# Patient Record
Sex: Female | Born: 1970 | Race: White | Hispanic: No | Marital: Married | Smoking: Never smoker
Health system: Southern US, Community
[De-identification: ages and names within clinical notes are randomized; demographics above are authoritative.]

## PROBLEM LIST (undated history)

## (undated) DIAGNOSIS — Z87442 Personal history of urinary calculi: Secondary | ICD-10-CM

## (undated) DIAGNOSIS — I10 Essential (primary) hypertension: Secondary | ICD-10-CM

## (undated) DIAGNOSIS — K219 Gastro-esophageal reflux disease without esophagitis: Secondary | ICD-10-CM

## (undated) DIAGNOSIS — T8859XA Other complications of anesthesia, initial encounter: Secondary | ICD-10-CM

## (undated) DIAGNOSIS — E785 Hyperlipidemia, unspecified: Secondary | ICD-10-CM

## (undated) DIAGNOSIS — N281 Cyst of kidney, acquired: Secondary | ICD-10-CM

## (undated) DIAGNOSIS — T7840XA Allergy, unspecified, initial encounter: Secondary | ICD-10-CM

## (undated) DIAGNOSIS — R112 Nausea with vomiting, unspecified: Secondary | ICD-10-CM

## (undated) DIAGNOSIS — F32A Depression, unspecified: Secondary | ICD-10-CM

## (undated) DIAGNOSIS — E119 Type 2 diabetes mellitus without complications: Secondary | ICD-10-CM

## (undated) DIAGNOSIS — N200 Calculus of kidney: Secondary | ICD-10-CM

## (undated) DIAGNOSIS — I499 Cardiac arrhythmia, unspecified: Secondary | ICD-10-CM

## (undated) DIAGNOSIS — G43909 Migraine, unspecified, not intractable, without status migrainosus: Secondary | ICD-10-CM

## (undated) DIAGNOSIS — Z9889 Other specified postprocedural states: Secondary | ICD-10-CM

## (undated) DIAGNOSIS — F329 Major depressive disorder, single episode, unspecified: Secondary | ICD-10-CM

## (undated) DIAGNOSIS — J45909 Unspecified asthma, uncomplicated: Secondary | ICD-10-CM

## (undated) HISTORY — DX: Hyperlipidemia, unspecified: E78.5

## (undated) HISTORY — PX: CHOLECYSTECTOMY: SHX55

## (undated) HISTORY — DX: Essential (primary) hypertension: I10

## (undated) HISTORY — DX: Major depressive disorder, single episode, unspecified: F32.9

## (undated) HISTORY — DX: Depression, unspecified: F32.A

## (undated) HISTORY — PX: TONSILLECTOMY: SUR1361

## (undated) HISTORY — DX: Gastro-esophageal reflux disease without esophagitis: K21.9

## (undated) HISTORY — DX: Migraine, unspecified, not intractable, without status migrainosus: G43.909

## (undated) HISTORY — DX: Allergy, unspecified, initial encounter: T78.40XA

## (undated) HISTORY — DX: Cyst of kidney, acquired: N28.1

## (undated) HISTORY — DX: Calculus of kidney: N20.0

## (undated) HISTORY — PX: OTHER SURGICAL HISTORY: SHX169

## (undated) HISTORY — PX: CARDIAC CATHETERIZATION: SHX172

## (undated) HISTORY — PX: ABDOMINAL HYSTERECTOMY: SHX81

## (undated) HISTORY — PX: LITHOTRIPSY: SUR834

## (undated) HISTORY — DX: Unspecified asthma, uncomplicated: J45.909

## (undated) HISTORY — DX: Type 2 diabetes mellitus without complications: E11.9

---

## 1997-07-28 ENCOUNTER — Other Ambulatory Visit: Admission: RE | Admit: 1997-07-28 | Discharge: 1997-07-28 | Payer: Self-pay | Admitting: *Deleted

## 1997-12-13 ENCOUNTER — Ambulatory Visit (HOSPITAL_COMMUNITY): Admission: RE | Admit: 1997-12-13 | Discharge: 1997-12-13 | Payer: Self-pay | Admitting: Urology

## 1997-12-13 ENCOUNTER — Encounter: Payer: Self-pay | Admitting: Urology

## 1997-12-28 ENCOUNTER — Observation Stay (HOSPITAL_COMMUNITY): Admission: RE | Admit: 1997-12-28 | Discharge: 1997-12-29 | Payer: Self-pay | Admitting: Surgery

## 1997-12-28 ENCOUNTER — Encounter: Payer: Self-pay | Admitting: Surgery

## 1999-04-10 ENCOUNTER — Encounter: Admission: RE | Admit: 1999-04-10 | Discharge: 1999-04-10 | Payer: Self-pay | Admitting: Family Medicine

## 1999-04-10 ENCOUNTER — Encounter: Payer: Self-pay | Admitting: Family Medicine

## 1999-11-23 ENCOUNTER — Encounter: Admission: RE | Admit: 1999-11-23 | Discharge: 1999-11-23 | Payer: Self-pay | Admitting: Urology

## 1999-11-23 ENCOUNTER — Encounter: Payer: Self-pay | Admitting: Urology

## 1999-12-27 ENCOUNTER — Encounter: Payer: Self-pay | Admitting: Urology

## 1999-12-28 ENCOUNTER — Encounter: Payer: Self-pay | Admitting: Urology

## 2000-01-12 ENCOUNTER — Ambulatory Visit (HOSPITAL_COMMUNITY): Admission: RE | Admit: 2000-01-12 | Discharge: 2000-01-12 | Payer: Self-pay | Admitting: Urology

## 2000-01-18 ENCOUNTER — Encounter: Payer: Self-pay | Admitting: Urology

## 2000-01-18 ENCOUNTER — Encounter: Admission: RE | Admit: 2000-01-18 | Discharge: 2000-01-18 | Payer: Self-pay | Admitting: Urology

## 2000-01-29 ENCOUNTER — Encounter: Admission: RE | Admit: 2000-01-29 | Discharge: 2000-01-29 | Payer: Self-pay | Admitting: Urology

## 2000-01-29 ENCOUNTER — Encounter: Payer: Self-pay | Admitting: Urology

## 2000-08-12 ENCOUNTER — Other Ambulatory Visit: Admission: RE | Admit: 2000-08-12 | Discharge: 2000-08-12 | Payer: Self-pay | Admitting: *Deleted

## 2000-10-23 ENCOUNTER — Inpatient Hospital Stay (HOSPITAL_COMMUNITY): Admission: AD | Admit: 2000-10-23 | Discharge: 2000-10-23 | Payer: Self-pay | Admitting: Obstetrics and Gynecology

## 2000-10-29 ENCOUNTER — Encounter: Admission: RE | Admit: 2000-10-29 | Discharge: 2001-01-27 | Payer: Self-pay | Admitting: *Deleted

## 2001-02-03 ENCOUNTER — Encounter (INDEPENDENT_AMBULATORY_CARE_PROVIDER_SITE_OTHER): Payer: Self-pay | Admitting: Specialist

## 2001-02-03 ENCOUNTER — Inpatient Hospital Stay (HOSPITAL_COMMUNITY): Admission: AD | Admit: 2001-02-03 | Discharge: 2001-02-06 | Payer: Self-pay | Admitting: Obstetrics & Gynecology

## 2001-02-07 ENCOUNTER — Encounter: Admission: RE | Admit: 2001-02-07 | Discharge: 2001-03-09 | Payer: Self-pay | Admitting: Obstetrics & Gynecology

## 2001-03-20 ENCOUNTER — Other Ambulatory Visit: Admission: RE | Admit: 2001-03-20 | Discharge: 2001-03-20 | Payer: Self-pay | Admitting: *Deleted

## 2002-09-25 ENCOUNTER — Other Ambulatory Visit: Admission: RE | Admit: 2002-09-25 | Discharge: 2002-09-25 | Payer: Self-pay | Admitting: *Deleted

## 2003-04-05 ENCOUNTER — Encounter: Admission: RE | Admit: 2003-04-05 | Discharge: 2003-07-04 | Payer: Self-pay | Admitting: Family Medicine

## 2003-08-12 ENCOUNTER — Encounter: Admission: RE | Admit: 2003-08-12 | Discharge: 2003-11-10 | Payer: Self-pay | Admitting: Family Medicine

## 2004-04-07 ENCOUNTER — Encounter (INDEPENDENT_AMBULATORY_CARE_PROVIDER_SITE_OTHER): Payer: Self-pay | Admitting: Specialist

## 2004-04-07 ENCOUNTER — Ambulatory Visit (HOSPITAL_COMMUNITY): Admission: RE | Admit: 2004-04-07 | Discharge: 2004-04-07 | Payer: Self-pay | Admitting: Urology

## 2004-12-13 ENCOUNTER — Other Ambulatory Visit: Admission: RE | Admit: 2004-12-13 | Discharge: 2004-12-13 | Payer: Self-pay | Admitting: Obstetrics & Gynecology

## 2005-08-29 ENCOUNTER — Ambulatory Visit (HOSPITAL_COMMUNITY): Admission: RE | Admit: 2005-08-29 | Discharge: 2005-08-29 | Payer: Self-pay | Admitting: Urology

## 2006-02-20 ENCOUNTER — Emergency Department (HOSPITAL_COMMUNITY): Admission: EM | Admit: 2006-02-20 | Discharge: 2006-02-21 | Payer: Self-pay | Admitting: Emergency Medicine

## 2006-09-03 ENCOUNTER — Encounter: Admission: RE | Admit: 2006-09-03 | Discharge: 2006-09-03 | Payer: Self-pay | Admitting: Family Medicine

## 2008-08-01 ENCOUNTER — Observation Stay (HOSPITAL_COMMUNITY): Admission: EM | Admit: 2008-08-01 | Discharge: 2008-08-01 | Payer: Self-pay | Admitting: Emergency Medicine

## 2008-09-01 ENCOUNTER — Encounter: Admission: RE | Admit: 2008-09-01 | Discharge: 2008-09-01 | Payer: Self-pay | Admitting: Family Medicine

## 2009-01-29 DIAGNOSIS — D649 Anemia, unspecified: Secondary | ICD-10-CM

## 2009-01-29 HISTORY — DX: Anemia, unspecified: D64.9

## 2009-07-19 ENCOUNTER — Emergency Department (HOSPITAL_COMMUNITY): Admission: EM | Admit: 2009-07-19 | Discharge: 2009-07-19 | Payer: Self-pay | Admitting: Emergency Medicine

## 2009-08-03 DIAGNOSIS — F329 Major depressive disorder, single episode, unspecified: Secondary | ICD-10-CM | POA: Insufficient documentation

## 2009-08-03 DIAGNOSIS — E7800 Pure hypercholesterolemia, unspecified: Secondary | ICD-10-CM | POA: Insufficient documentation

## 2009-08-03 DIAGNOSIS — I10 Essential (primary) hypertension: Secondary | ICD-10-CM | POA: Insufficient documentation

## 2009-08-03 DIAGNOSIS — K219 Gastro-esophageal reflux disease without esophagitis: Secondary | ICD-10-CM | POA: Insufficient documentation

## 2009-08-03 DIAGNOSIS — J45909 Unspecified asthma, uncomplicated: Secondary | ICD-10-CM | POA: Insufficient documentation

## 2009-08-03 DIAGNOSIS — F3289 Other specified depressive episodes: Secondary | ICD-10-CM | POA: Insufficient documentation

## 2009-08-03 DIAGNOSIS — J301 Allergic rhinitis due to pollen: Secondary | ICD-10-CM | POA: Insufficient documentation

## 2009-08-03 DIAGNOSIS — E669 Obesity, unspecified: Secondary | ICD-10-CM | POA: Insufficient documentation

## 2009-08-03 DIAGNOSIS — E119 Type 2 diabetes mellitus without complications: Secondary | ICD-10-CM | POA: Insufficient documentation

## 2009-08-04 ENCOUNTER — Ambulatory Visit: Payer: Self-pay | Admitting: Internal Medicine

## 2009-08-04 DIAGNOSIS — R05 Cough: Secondary | ICD-10-CM | POA: Insufficient documentation

## 2009-08-04 DIAGNOSIS — R059 Cough, unspecified: Secondary | ICD-10-CM | POA: Insufficient documentation

## 2009-08-19 ENCOUNTER — Ambulatory Visit: Payer: Self-pay | Admitting: Internal Medicine

## 2010-02-28 NOTE — Letter (Signed)
Summary: Out of Work  [employer]  [address]   [place], [place] [address]   Phone: [phone]  Fax: [phone]    August 19, 2009   Employee:  JAKAI ONOFRE    To Whom It May Concern:   For Medical reasons, please excuse the above named employee from work for the following dates:  Start:   08/19/09  End:   08/19/09  If you need additional information, please feel free to contact our office.         Sincerely,    Sandrea Hughs, MD

## 2010-02-28 NOTE — Letter (Signed)
Summary: Out of Work  [employer]  [address]   [place], [place] [address]   Phone: [phone]  Fax: [phone]    August 04, 2009   Employee:  JORDI KAMM    To Whom It May Concern:   For Medical reasons, please excuse the above named employee from work for the following dates:  Start:   08/04/09  End:   08/04/09  If you need additional information, please feel free to contact our office.         Sincerely,    Sandrea Hughs, MD

## 2010-02-28 NOTE — Assessment & Plan Note (Signed)
Summary: Pulmonary/ new pt eval for cough   Visit Type:  Initial Consult Copy to:  Dr. Lupita Raider Primary Provider/Referring Provider:  Dr. Lupita Raider  CC:  Dyspnea and Cough.  History of Present Illness: 40 yowf never smoker dx asthma age 40 last under good control in 2010  = using symbicort 2 puffs in am with occ use of saba mostly in spring and fall or maybe with chest tight wheezy and sob  August 04, 2009 cc new cough since February 2010 while on ace d/c'd maybe two months but no better at all after stopped cough is day > night, minimally productive.  seen by allergy Willa Rough no allergy tests but didn't feel it was asthma, no better on symbicort or saba.  cough so hard has lost urine and breath. Pt denies any significant sore throat, dysphagia, itching, sneezing,  nasal congestion or excess secretions,  fever, chills, sweats, unintended wt loss, pleuritic or exertional cp, hempoptysis, change in activity tolerance  orthopnea pnd or leg swelling Pt also denies any obvious fluctuation in symptoms with weather or environmental change or other alleviating or aggravating factors x worse laughing and with use of voice.   Current Medications (verified): 1)  Patanase 0.6 % Soln (Olopatadine Hcl) .Marland Kitchen.. 1 Spray Each Nostril At Bedtime 2)  Fluticasone Propionate 50 Mcg/act Susp (Fluticasone Propionate) .Marland Kitchen.. 1 Spray Each Nostril Every Am 3)  Symbicort (? Strength) .... 2 Puffs Two Times A Day 4)  Omeprazole 20 Mg Cpdr (Omeprazole) .Marland Kitchen.. 1 Once Daily 5)  Simvastatin 80 Mg Tabs (Simvastatin) .Marland Kitchen.. 1 Once Daily 6)  Diovan 80 Mg Tabs (Valsartan) .Marland Kitchen.. 1 Once Daily 7)  Metoprolol Succinate 25 Mg Xr24h-Tab (Metoprolol Succinate) .Marland Kitchen.. 1 Once Daily  Allergies (verified): 1)  ! Sulfa  Past History:  Past Medical History:  GERD (ICD-530.81) DEPRESSION (ICD-311) OBESITY, UNSPECIFIED (ICD-278.00) ASTHMA (ICD-493.90) ALLERGIC RHINITIS DUE TO POLLEN (ICD-477.0) DIABETES MELLITUS  (ICD-250.00) HYPERTENSION, BENIGN ESSENTIAL (ICD-401.1) PURE HYPERCHOLESTEROLEMIA (ICD-272.0) Chronic cough.....................................................Marland KitchenWert     - onset 03/2009     - off ACE around 4/-05/2009  Past Surgical History: Cholecystectomy 1998 Tubal ligation 2003  Family History: Asthma- Brother Heart dz- Father and both Grandmothers  Social History: Never smoker 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  The patient denies coughing up blood, irregular heartbeats, acid heartburn, indigestion, loss of appetite, weight change, abdominal pain, difficulty swallowing, sore throat, tooth/dental problems, nasal congestion/difficulty breathing through nose, sneezing, itching, ear ache, anxiety, depression, hand/feet swelling, joint stiffness or pain, rash, change in color of mucus, and fever.    Vital Signs:  Patient profile:   40 year old female Height:      59 inches Weight:      157 pounds BMI:     31.82 O2 Sat:      99 % on Room air Temp:     98.3 degrees F oral Pulse rate:   88 / minute BP sitting:   116 / 84  (left arm)  Vitals Entered By: Vernie Murders (August 04, 2009 10:30 AM)  O2 Flow:  Room air  Physical Exam  Additional Exam:  wt 157 August 04, 2009 amb wf mild voice fatigue HEENT: nl dentition, turbinates, and orophanx. Nl external ear canals without cough reflex NECK :  without JVD/Nodes/TM/ nl carotid upstrokes  bilaterally LUNGS: no acc muscle use, clear to A and P bilaterally without cough on insp or exp maneuvers CV:  RRR  no s3 or murmur or increase in P2, no edema  ABD:  soft and nontender with nl excursion in the supine position. No bruits or organomegaly, bowel sounds nl MS:  warm without deformities, calf tenderness, cyanosis or clubbing SKIN: warm and dry without  lesions   NEURO:  alert, approp, no deficits     Impression & Recommendations:  Problem # 1:  COUGH (ICD-786.2)  The most common causes of chronic cough in immunocompetent adults include: upper airway cough syndrome (UACS), previously referred to as postnasal drip syndrome,  caused by variety of rhinosinus conditions; (2) asthma; (3) GERD; (4) chronic bronchitis from cigarette smoking or other inhaled environmental irritants; (5) nonasthmatic eosinophilic bronchitis; and (6) bronchiectasis. These conditions, singly or in combination, have accounted for up to 94% of the causes of chronic cough in prospective studies.  Of the three most common causes of chronic cough, only one can actually cause the other two and perpetuate the cylce of cough inducing airway trauma, inflammation, heightened sensitivity to reflux which is prompted by the cough itself via a cyclical mechanism.  This may partially respond to steroids and look like asthma and post nasal drainage but never erradicated completely unless the cough and the secondary reflux are eliminated, preferably both at the same time. See instructions for specific recommendations   The standardized cough guidelines recently published in Chest are a 14 step process, not a single office visit,  and are intended  to address this problem logically,  with an alogrithm dependent on response to each progressive step  to determine a specific diagnosis with  minimal addtional testing needed. Therefore if compliance is an issue this empiric standardized approach simply won't work.   Orders: Consultation Level V (84132)  Problem # 2:  ASTHMA (ICD-493.90) Strongly doubt asthma here. Try off symbicort x 2 weeks and just use prns sob  Problem # 3:  HYPERTENSION, BENIGN ESSENTIAL (ICD-401.1)  Her updated medication list for this problem includes:    Diovan 80 Mg Tabs (Valsartan) .Marland Kitchen... 1 once daily    Metoprolol Succinate 25 Mg Xr24h-tab (Metoprolol succinate)  .Marland Kitchen... 1 once daily  ACE inhibitors were probably fueling this problem because they inhibit the metabolism of Upper Air Bradykinin,  a potent mediator of upper airways inflammation. If they are the "fuel" then something else usually is the "spark" (eg viral uri, post nasal drainage, or reflux). In the era of ARB equivalency, at least in the short run, I recommend the ACE be avoided indefinetly.  Orders: Consultation Level V [phone])  Medications Added to Medication List This Visit: 1)  Patanase 0.6 % Soln (Olopatadine hcl) .Marland Kitchen.. 1 spray each nostril at bedtime 2)  Fluticasone Propionate 50 Mcg/act Susp (Fluticasone propionate) .Marland Kitchen.. 1 spray each nostril every am 3)  Symbicort (? Strength)  .... 2 puffs two times a day 4)  Symbicort (? Strength)  .Marland Kitchen.. 1-2 every 12 hours if needed for breathing 5)  Omeprazole 20 Mg Cpdr (Omeprazole) .Marland Kitchen.. 1 once daily 6)  Omeprazole 20 Mg Cpdr (Omeprazole) .... Take one 30-60 min before first and last meals of the day 7)  Simvastatin 80 Mg Tabs (Simvastatin) .Marland Kitchen.. 1 once daily 8)  Diovan 80 Mg Tabs (Valsartan) .Marland Kitchen.. 1 once daily 9)  Metoprolol Succinate 25 Mg Xr24h-tab (Metoprolol succinate) .Marland Kitchen.. 1 once daily 10)  Prednisone 10 Mg Tabs (Prednisone) .... 4  each am x 2days, 2x2days, 1x2days and stop 11)  Tramadol Hcl 50 Mg Tabs (Tramadol hcl) .... One to two by mouth every 4-6 hours if coughing  Patient Instructions: 1)  Change symbicort to only use as needed 2)  Prednisone x 6 days 3)  Take delsym two tsp every 12 hours and add tramadol 50 mg up to 2 every 4 hours to suppress the urge to cough. Swallowing water or using ice chips/non mint and menthol containing candies (such as lifesavers or sugarless jolly ranchers) are also effective.  4)  Acid reflux is the leading suspect here and needs to be eliminated  completely before considering additional studies or treatment options. To suppress this maximally, take Nexium or prilosec  before first and last meal and pepcid  20 mg (otc) at bedtime plus diet measures as listed.  5)  GERD (REFLUX)  is a common cause of respiratory symptoms. It commonly presents without heartburn and can be treated with medication, but also with lifestyle changes including avoidance of late meals, excessive alcohol, smoking cessation, and avoid fatty foods, chocolate, peppermint, colas, red wine, and acidic juices such as orange juice. NO MINT OR MENTHOL PRODUCTS SO NO COUGH DROPS  6)  USE SUGARLESS CANDY INSTEAD (jolley ranchers)  7)  NO OIL BASED VITAMINS  8)  Please schedule a follow-up appointment in 2  weeks, sooner if needed  Prescriptions: TRAMADOL HCL 50 MG  TABS (TRAMADOL HCL) One to two by mouth every 4-6 hours if coughing  #40 x 0   Entered and Authorized by:   Nyoka Cowden MD   Signed by:   Nyoka Cowden MD on 08/04/2009   Method used:   Electronically to        [facility]  [address] [phone]* (retail)       [address]       [place], [place]  [address]       Ph: [phone]       Fax: [phone]   RxID:   [id number] PREDNISONE 10 MG  TABS (PREDNISONE) 4 each am x 2days, 2x2days, 1x2days and stop  #14 x 0   Entered and Authorized by:   Nyoka Cowden MD   Signed by:   Nyoka Cowden MD on 08/04/2009   Method used:   [facility]ically to        [facility]  [address] [phone]* (retail)       [address]       [place], [place]  [address]       Ph: [phone]       Fax: [phone]   RxID:   [id number]

## 2010-02-28 NOTE — Assessment & Plan Note (Signed)
Summary: Pulmonary/ ext f/u ov with hfa 90%   Copy to:  Dr. Lupita Raider Primary Provider/Referring Provider:  Dr. Lupita Raider  CC:  2 wk followup.  Pt states cough is much improved- only has occ dry cough.  She states that her breathing improved alot with prednisone and started to get worse once she d/ced but still better then when last seen.Marland Kitchen  History of Present Illness: 16 yowf never smoker dx asthma age 40 last under good control in 2010  = using symbicort 2 puffs in am with occ use of saba mostly in spring and fall or maybe with chest tight wheezy and sob  August 04, 2009 cc new cough since February 2010 while on ace d/c'd maybe two months but no better at all after stopped cough is day > night, minimally productive.  seen by allergy Willa Rough no allergy tests but didn't feel it was asthma, no better on symbicort or saba.  cough so hard has lost urine and breath. 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 Swallowing water or using ice chips/non mint and menthol containing candies (such as lifesavers or sugarless jolly ranchers) are also effective.  Acid reflux is the leading suspect here and needs to be eliminated  completely before considering additional studies or treatment options. To suppress this maximally, take Nexium or prilosec  before first and last meal and pepcid 20 mg (otc) at bedtime plus diet measures as listed.    August 19, 2009 2 wk followup.  Pt states cough is much improved- only has occ dry cough.  She states that her breathing improved alot with prednisone and started to get worse once she d/ced but still better than when last seen.   Current Medications (verified): 1)  Patanase 0.6 % Soln (Olopatadine Hcl) .Marland Kitchen.. 1 Spray Each Nostril At Bedtime 2)  Fluticasone Propionate 50 Mcg/act Susp (Fluticasone Propionate) .Marland Kitchen.. 1 Spray  Each Nostril Every Am 3)  Symbicort (? Strength) .Marland Kitchen.. 1-2 Every 12 Hours If Needed For Breathing 4)  Omeprazole 20 Mg Cpdr (Omeprazole) .... Take One 30-60 Min Before First and Last Meals of The Day 5)  Simvastatin 80 Mg Tabs (Simvastatin) .Marland Kitchen.. 1 Once Daily 6)  Diovan 80 Mg Tabs (Valsartan) .Marland Kitchen.. 1 Once Daily 7)  Metoprolol Succinate 25 Mg Xr24h-Tab (Metoprolol Succinate) .Marland Kitchen.. 1 Once Daily 8)  Tramadol Hcl 50 Mg  Tabs (Tramadol Hcl) .... One To Two By Mouth Every 4-6 Hours If Coughing  Allergies (verified): 1)  ! Sulfa  Past History:  Past Medical History:  GERD (ICD-530.81) DEPRESSION (ICD-311) OBESITY, UNSPECIFIED (ICD-278.00) ASTHMA (ICD-493.90)     - HFA 90% August 19, 2009  ALLERGIC RHINITIS DUE TO POLLEN (ICD-477.0) DIABETES MELLITUS (ICD-250.00) HYPERTENSION, BENIGN ESSENTIAL (ICD-401.1) PURE HYPERCHOLESTEROLEMIA (ICD-272.0) Chronic cough.....................................................Marland KitchenWert     - onset 03/2009     - off ACE  04/2009  Vital Signs:  Patient profile:   40 year old female Weight:      157.50 pounds O2 Sat:      96 % on Room air Temp:     98.6 degrees F oral Pulse rate:   98 / minute BP sitting:   116 / 68  (left arm)  Vitals Entered By: Vernie Murders (August 19, 2009 4:15 PM)  O2 Flow:  Room air  Physical Exam  Additional Exam:  wt 157 August 04, 2009 > 157 August 19, 2009  amb wf no longer with voice fatigue HEENT: nl dentition, turbinates, and orophanx. Nl external ear canals without cough reflex NECK :  without JVD/Nodes/TM/ nl carotid upstrokes bilaterally LUNGS: no acc muscle use, clear to A and P bilaterally without cough on insp or exp maneuvers CV:  RRR  no s3 or murmur or increase in P2, no edema  ABD:  soft and nontender with nl excursion in the supine position. No bruits or organomegaly, bowel sounds nl MS:  warm without deformities, calf tenderness, cyanosis or clubbing SKIN: warm and dry without lesions   NEURO:  alert, approp, no  deficits     Impression & Recommendations:  Problem # 1:  COUGH (ICD-786.2)  Clearly this is  Classic Upper airway cough syndrome, so named because it's frequently impossible to sort out how much is  CR/sinusitis with freq throat clearing (which can be related to primary GERD)   vs  causing  secondary extra esophageal GERD from wide swings in gastric pressure that occur with throat clearing, promoting self use of mint and menthol lozenges that reduce the lower esophageal sphincter tone and exacerbate the problem further These are the same pts who not infrequently have failed to tolerate ace inhibitors,  dry powder inhalers or biphosphonates or report having reflux symptoms that don't respond to standard doses of PPI  For now continue to rx gern aggressively with ppi and diet and see if can control without cough suppressants  Orders: Est. Patient Level IV ([address])  Problem # 2:  ASTHMA (ICD-493.90) I discussed the importance of understanding the goals of asthma therapy including the rule of 2's as it applies to the use of a rescue inhaler.   For now will ask her to use symbicort as a as needed, the way it is used in [place], with worse case scenario she needs to continue it up to two times a day dosing  I spent extra time with the patient today explaining optimal mdi  technique.  This improved from  75-90% dosing.  Medications Added to Medication List This Visit: 1)  Nexium 40 Mg Cpdr (Esomeprazole magnesium) .... By mouth daily. take one half hour before eating.  Patient Instructions: 1)  Stay on maximum acid suppression for another 2 weeks then it's ok to continue Nexium Take  one 30-60 min before first meal of the day  but if worsen go back to taking  before first and last meal and pepcid 20 mg (otc) at bedtime plus diet measures discussed 2)  Symbicort can be used up to 2 every 12 hours as needed 3)  Please schedule a follow-up appointment as needed. Prescriptions: NEXIUM 40 MG  CPDR  (ESOMEPRAZOLE MAGNESIUM) By mouth daily. Take one half hour before eating.  #34 x 11   Entered and Authorized by:   Nyoka Cowden MD   Signed by:   Nyoka Cowden MD on 08/19/2009   Method used:   [facility]  [address] [phone]* (retail)       [address]       [place], [place]  [address]       Ph: [phone]       Fax: [phone]   RxID:   [id number]

## 2010-04-16 LAB — POCT CARDIAC MARKERS
Myoglobin, poc: 51.2 ng/mL (ref 12–200)
Troponin i, poc: <0.05 ng/mL (ref 0.00–0.09)
Troponin i, poc: <0.05 ng/mL (ref 0.00–0.09)

## 2010-04-16 LAB — BASIC METABOLIC PANEL
BUN: 10 mg/dL (ref 6–23)
CO2: 24 mEq/L (ref 19–32)
Calcium: 9.7 mg/dL (ref 8.4–10.5)
Creatinine, Ser: 0.69 mg/dL (ref 0.4–1.2)
Glucose, Bld: 234 mg/dL — ABNORMAL HIGH (ref 70–99)

## 2010-04-16 LAB — CBC
MCV: 88.5 fL (ref 78.0–100.0)
Platelets: 219 10*3/uL (ref 150–400)

## 2010-04-16 LAB — DIFFERENTIAL
Basophils Absolute: 0 10*3/uL (ref 0.0–0.1)
Basophils Relative: 0 % (ref 0–1)
Eosinophils Absolute: 0.2 10*3/uL (ref 0.0–0.7)
Lymphocytes Relative: 25 % (ref 12–46)
Lymphs Abs: 2.7 10*3/uL (ref 0.7–4.0)
Monocytes Relative: 7 % (ref 3–12)
Neutro Abs: 7.2 10*3/uL (ref 1.7–7.7)

## 2010-04-16 LAB — D-DIMER, QUANTITATIVE: D-Dimer, Quant: 0.26 ug/mL-FEU (ref 0.00–0.48)

## 2010-04-16 LAB — BRAIN NATRIURETIC PEPTIDE: Pro B Natriuretic peptide (BNP): <30 pg/mL (ref 0.0–100.0)

## 2010-05-04 ENCOUNTER — Other Ambulatory Visit: Payer: Self-pay | Admitting: Gastroenterology

## 2010-05-07 LAB — COMPREHENSIVE METABOLIC PANEL
Albumin: 4 g/dL (ref 3.5–5.2)
BUN: 7 mg/dL (ref 6–23)
GFR calc Af Amer: >60 mL/min (ref >60)
Glucose, Bld: 81 mg/dL (ref 70–99)
Potassium: 3 mEq/L — ABNORMAL LOW (ref 3.5–5.1)
Sodium: 138 mEq/L (ref 135–145)

## 2010-05-07 LAB — DIFFERENTIAL
Basophils Relative: 0 % (ref 0–1)
Eosinophils Relative: 1 % (ref 0–5)
Lymphocytes Relative: 30 % (ref 12–46)
Lymphs Abs: 2.4 10*3/uL (ref 0.7–4.0)
Monocytes Absolute: 0.5 10*3/uL (ref 0.1–1.0)
Neutrophils Relative %: 62 % (ref 43–77)

## 2010-05-07 LAB — CBC
Hemoglobin: 13.7 g/dL (ref 12.0–15.0)
MCV: 90.4 fL (ref 78.0–100.0)
Platelets: 247 10*3/uL (ref 150–400)
RDW: 12.7 % (ref 11.5–15.5)
WBC: 8.1 10*3/uL (ref 4.0–10.5)

## 2010-05-07 LAB — URINALYSIS, ROUTINE W REFLEX MICROSCOPIC
Bilirubin Urine: NEGATIVE
Glucose, UA: NEGATIVE mg/dL
Ketones, ur: 15 mg/dL — AB
Nitrite: NEGATIVE
Protein, ur: NEGATIVE mg/dL

## 2010-05-07 LAB — URINE MICROSCOPIC-ADD ON

## 2010-06-16 NOTE — Discharge Summary (Signed)
[facility]  Patient:    ZALEA, PETE Visit Number: [id number] MRN: [id number]          Service Type: Attending:  Duke Salvia. Marcelle Overlie, M.D. Dictated by:   Danie Chandler, R.N. Adm. Date:  02/03/01 Disc. Date: 02/06/01                             Discharge Summary  ADMISSION DIAGNOSES:          1. Intrauterine pregnancy at 37 weeks plus 1 day                                  gestation.                               2. Surgically scarred uterus, refused vaginal                                  birth after delivery.                               3. Multiparity and request for surgical                                  sterilization.  DISCHARGE DIAGNOSES:          1. Intrauterine pregnancy at 37 weeks plus 1 day                                  gestation.                               2. Surgically scarred uterus, refused vaginal                                  birth after delivery.                               3. Multiparity and request for surgical                                  sterilization.  PROCEDURE:                    On February 03, 2001, repeat low transverse cervical cesarean section and bilateral tubal ligation by Pomeroy technique.  REASON FOR ADMISSION:         Please see H&P.  HOSPITAL COURSE:              The patient was taken to the operating room and underwent the above-named procedure without complications.  This was productive of a viable female infant with Apgars of 9 at 1 minute and 9 at 5 minutes, an arterial cord pH of 7.33.  Postoperatively, on day #1, the patients hemoglobin was 11.0, hematocrit 32.2, and white blood cell count  9.6.  The patient had a good return of bowel function and was tolerating a regular diet on this day.  On postoperative day #2, she was ambulating well without difficulty and had good pain control.  She was discharged home on postoperative day #3.  CONDITION ON DISCHARGE:       Good.  DIET:                          Regular as tolerated.  ACTIVITY:                     No heavy lifting, no driving, no vaginal entry.  FOLLOWUP:                     She is to follow up in the office in 1-2 weeks for an incision check and she is to call for a temperature greater than 100 degrees, persistent nausea or vomiting, heavy vaginal bleeding, and/or redness or drainage from the incision site.  DISCHARGE MEDICATIONS:        1. Prenatal vitamin 1 p.o. q.d.                               2. Tylox #25 one to two p.o. q.4-6h. p.r.n.                                  pain. Dictated by:   Danie Chandler, R.N. Attending:  Duke Salvia. Marcelle Overlie, M.D. DD:  02/06/01 TD:  02/06/01 Job: [id number]

## 2010-06-16 NOTE — Op Note (Signed)
[facility]  Patient:    Amy Clarke, Amy Clarke Visit Number: [id number] MRN: [id number]          Service Type: Attending:  Freddy Finner, M.D. Dictated by:   Freddy Finner, M.D.                             Operative Report  PREOPERATIVE DIAGNOSES:       1. Intrauterine pregnancy at 37 weeks +1 day                                  gestation.                               2. Surgically scarred uterus, refused vaginal                                  birth after delivery.                               3. Multiparity and requests for surgical                                  sterilization.  POSTOPERATIVE DIAGNOSES:      1. Intrauterine pregnancy at 37 weeks +1 day                                  gestation.                               2. Surgically scarred uterus, refused vaginal                                  birth after delivery.                               3. Multiparity and requests for surgical                                  sterilization.  OPERATION:                    1. Repeat low transverse cervical cesarean                                  section with delivery of a viable female                                  infant, Apgars of 9 and 9, cord pH 7.33.                               2. Bilateral tubal ligation by  Pomeroy                                  technique.  SURGEON:                      Freddy Finner, M.D.  ANESTHESIA:                   Spinal.  INTRAOPERATIVE COMPLICATIONS: None.  ESTIMATED INTRAOPERATIVE BLOOD LOSS:                   600 cc.  DESCRIPTION OF PROCEDURE:     The patient is a 40 year old having her fourth pregnancy.  She is admitted now for repeat cesarean and tubal ligation.  She was admitted on the morning of surgery and brought to the operating room and there placed under adequate spinal anesthesia, placed in the dorsal recumbent position.  The patient had noticed spontaneous drainage this morning of a  skin abscess at the left lower quadrant, just above the inguinal ligament.  The abdomen was prepped carefully and this area draped out of the field with Vi-drape.  An incision was made above the previous incision to avoid entry into the subcu abscess.  The abscess ______ spontaneously drained and there was no active drainage at the time of the procedure.  The incision was carried sharply down to fascia, which was entered sharply and extended to extend the skin incision.  A rectus sheath was developed superiorly and inferiorly with blunt and sharp dissection.  Rectus muscles were divided at the midline.  The peritoneum was entered sharply and extended bluntly to the external skin incision.  A bladder blade was placed.  A transverse incision was made in the visceral peritoneum overlying the lower uterine segment.  A transverse incision was made in the lower segment and extended bluntly in a transverse direction.  Membranes were ruptured.  Fluid was clear.  With a vacuum extractor, a viable female infant was easily delivered.  Apgars and cord pH are noted above.  Blood was taken from the umbilical cord for arterial blood sampling and for routine venous sampling.  Placenta was manually extracted. All membranes were removed from the endometrium.  The uterus was delivered through the abdominal incision.  The uterus itself was normal, as were the tubes and ovaries.  The uterine incision was closed in a single layer of running locking 0 Monocryl.  Bladder flaps were reapproximated with 0 Monocryl.  The isthmic portion of each tube was elevated, doubly ligated with 0 plain ties, and a segment of tube excised.  The exposed mucosal tips distal to the tie were fulgurated with the Bovie.  Hemostasis was adequate. The uterus was placed back within the abdominal cavity.  Irrigation was carried out.  Hemostasis was again noted to be complete.  All packs, needles, and instruments were removed.  Counts were  correct.  The abdominal incision was closed in layers.  A running suture of Monocryl was used to close the peritoneum and reapproximating the rectus muscles.  The fascia was closed with 0 PDS.  The skin was closed with broad skin staples.  The skin glue was applied to the incision in hopes of shielding it from drainage from the subcutaneous abscess. Dictated by:   Freddy Finner, M.D. Attending:  Freddy Finner, M.D. DD:  02/03/01 TD:  02/03/01 Job: [id number]

## 2010-06-16 NOTE — H&P (Signed)
[facility]  Patient:    Amy Clarke, Amy Clarke Visit Number: [id number] MRN: [id number]          Service Type: OBS Location: [facility] Attending Physician:  Rhina Brackett Dictated by:   Freddy Finner, M.D. Admit Date:  10/23/2000 Discharge Date: 10/23/2000                           History and Physical  ADMITTING DIAGNOSIS:            Intrauterine pregnancy at 37-1/[redacted] weeks gestation.  Refused vaginal birth after cesarean section for repeat cesarean delivery and bilateral tubal ligation.  HISTORY OF PRESENT ILLNESS:     The patient is a 40 year old white married female gravida 5, para 3 with one birth by cesarean who has been treated for gestational diabetes with all four of her pregnancies which have progressed beyond the first trimester.  With this pregnancy, she has required a single dose of insulin at bedtime for adequate control.  She was initially scheduled for two stage induction but on further review and consideration with the patient she has requested surgical intervention due to her concern over the potential for uterine rupture with previous cesarean delivery and her request for tubal sterilization.  She was in the office on January 31, 2001 for attempted amniocentesis but the amniotic fluid was diminished and a safe packet could not be adequately tapped to perform LS and PG studies for lung maturity.  Based on the fact that she will be greater than [redacted] weeks gestation we have electively scheduled a repeat cesarean delivery.  Other issues are of a positive group B strep, a history of asthma and a history of PIH with previous pregnancies.  This pregnancy has been complicated only by mild asthmatic symptoms which are present at the time of this admission.  There has been no problem with pregnancy induced hypertension this time.  REVIEW OF SYSTEMS:              Her current review of systems except for her mild difficulty with breathing is  negative.  PAST MEDICAL HISTORY:           Her past medical history is recorded in detail in the prenatal summary and will not be repeated.  PHYSICAL EXAMINATION:  VITAL SIGNS:                    Blood pressure in the office was 104/70.  HEENT:                          Grossly within normal limits.  CHEST:                          Her chest was clear to auscultation throughout in spite of her complaints of a bit of shortness of breath.  No rhonchi or rales could be heard.  HEART:                          Normal sinus rhythm without murmurs, rubs or gallops.  ABDOMEN:                        Gravid.  Estimated fetal weight of 7 pounds.  EXTREMITIES:  Deep tendon reflexes are +1.  No cyanosis, clubbing or edema.  PELVIC:                         Cervix is 1 cm dilated with internal os 25% effaced.  Vertex is floating.  ASSESSMENT:                     1. Intrauterine pregnancy at 37-1/[redacted] weeks                                    gestation.                                 2. Refusal of vaginal birth after cesarean                                    section for this pregnancy.                                 3. Requests for voluntary sterilization along                                    with repeat cesarean delivery.  PLAN:                           Repeat low transverse cesarean section, bilateral tubal ligation. Dictated by:   Freddy Finner, M.D. Attending Physician:  Rhina Brackett DD:  01/31/01 TD:  01/31/01 Job: [id number]

## 2011-03-30 ENCOUNTER — Other Ambulatory Visit: Payer: Self-pay | Admitting: Family Medicine

## 2011-03-30 ENCOUNTER — Ambulatory Visit
Admission: RE | Admit: 2011-03-30 | Discharge: 2011-03-30 | Disposition: A | Discharge status: Home / Self Care | Payer: BC Managed Care – PPO | Source: Ambulatory Visit | Attending: Family Medicine | Admitting: Family Medicine

## 2011-03-30 DIAGNOSIS — M25551 Pain in right hip: Secondary | ICD-10-CM

## 2013-09-24 ENCOUNTER — Other Ambulatory Visit: Payer: Self-pay | Admitting: Family Medicine

## 2013-09-24 DIAGNOSIS — N644 Mastodynia: Secondary | ICD-10-CM

## 2013-09-30 ENCOUNTER — Other Ambulatory Visit: Payer: BC Managed Care – PPO

## 2013-10-02 ENCOUNTER — Ambulatory Visit
Admission: RE | Admit: 2013-10-02 | Discharge: 2013-10-02 | Disposition: A | Discharge status: Home / Self Care | Payer: BC Managed Care – PPO | Source: Ambulatory Visit | Attending: Family Medicine | Admitting: Family Medicine

## 2013-10-02 DIAGNOSIS — N644 Mastodynia: Secondary | ICD-10-CM

## 2014-02-23 ENCOUNTER — Other Ambulatory Visit: Payer: Self-pay | Admitting: Family Medicine

## 2014-02-23 DIAGNOSIS — R1013 Epigastric pain: Secondary | ICD-10-CM

## 2014-03-01 ENCOUNTER — Other Ambulatory Visit: Payer: Self-pay | Admitting: Family Medicine

## 2014-03-01 ENCOUNTER — Ambulatory Visit
Admission: RE | Admit: 2014-03-01 | Discharge: 2014-03-01 | Disposition: A | Discharge status: Home / Self Care | Payer: BLUE CROSS/BLUE SHIELD | Source: Ambulatory Visit | Attending: Family Medicine | Admitting: Family Medicine

## 2014-03-01 DIAGNOSIS — R1013 Epigastric pain: Secondary | ICD-10-CM

## 2014-03-01 MED ORDER — IOHEXOL 300 MG/ML  SOLN
100.0000 mL | Freq: Once | INTRAMUSCULAR | Status: AC | PRN
  Administered 2014-03-01: 100 mL via INTRAVENOUS

## 2015-06-22 ENCOUNTER — Encounter: Payer: Self-pay | Admitting: Cardiology

## 2015-06-22 ENCOUNTER — Ambulatory Visit
Admission: RE | Admit: 2015-06-22 | Discharge: 2015-06-22 | Disposition: A | Discharge status: Home / Self Care | Payer: 59 | Source: Ambulatory Visit | Attending: Family Medicine | Admitting: Family Medicine

## 2015-06-22 ENCOUNTER — Other Ambulatory Visit: Payer: Self-pay | Admitting: Family Medicine

## 2015-06-22 DIAGNOSIS — R079 Chest pain, unspecified: Secondary | ICD-10-CM

## 2015-06-22 NOTE — Progress Notes (Signed)
Patient ID: [name], female   DOB: 1970-02-01, 45 y.o.   MRN: [id number]     Cardiology Office Note   Date:  06/23/2015   ID:  Amy Clarke, DOB 03/23/1970, MRN [id number]  PCP:  Mayra Neer, MD  Cardiologist:   Jenkins Rouge, MD   Chief Complaint  Patient presents with  . Establish Care    elevated HR & SOB      History of Present Illness: Amy Clarke is a 45 y.o. female who presents for evaluation of tachycardia CRF:s DM, HTN and elevated lipids Seen by Dr Brigitte Pulse 5/24 Asthmatic with constant chest pressure that worsens with exertion and going outside since April. Been Rx with amoxicillin, augmentin and prednisone some help. Also has symbicort and albuterol. Pressure also present at rest Noted palpiatations Started on cardizem and norvasc stopped  ECG 06/22/15  Narrow complex tachycardia rate 139 sinus vs long RP tachycardia   She feels some better today. Not wheezing .  Changed to zyrtec from claritin for allergies Family history positive for father having MI in 2's.  Thyroid not checked recently ECG today shows SR She has significant sinus arrhythmia     Past Medical History  Diagnosis Date  . Diabetes ([place])   . Hypertension   . Hyperlipidemia   . Asthma   . Kidney stones   . Migraine headache   . Kidney cysts   . Depression   . GERD (gastroesophageal reflux disease)     Past Surgical History  Procedure Laterality Date  . None       Current Outpatient Prescriptions  Medication Sig Dispense Refill  . Albuterol Sulfate 108 (90 Base) MCG/ACT AEPB Inhale 2 puffs into the lungs as needed (as needed inhalation).    Marland Kitchen atorvastatin (LIPITOR) 80 MG tablet Take 80 mg by mouth daily.    . Beclomethasone Dipropionate (QNASL) 80 MCG/ACT AERS Place 2 sprays into the nose daily.    . budesonide-formoterol (SYMBICORT) 160-4.5 MCG/ACT inhaler Inhale 2 puffs into the lungs 2 (two) times daily.    Marland Kitchen buPROPion (WELLBUTRIN XL) 150 MG 24 hr tablet Take 150 mg by mouth daily.  Take 2 tablets orally once a day    . glimepiride (AMARYL) 4 MG tablet Take 4 mg by mouth daily with breakfast.    . metFORMIN (GLUCOPHAGE) 1000 MG tablet Take 1,000 mg by mouth 2 (two) times daily with a meal.    . omeprazole (PRILOSEC) 20 MG capsule Take 20 mg by mouth daily as needed.    . valsartan (DIOVAN) 320 MG tablet Take 320 mg by mouth daily.    . Vitamin D, Cholecalciferol, 1000 units CAPS Take 1 capsule by mouth daily.    . metoprolol tartrate (LOPRESSOR) 25 MG tablet Take 1 tablet (25 mg total) by mouth 2 (two) times daily. 180 tablet 3   No current facility-administered medications for this visit.    Allergies:   Kelle Darting; Lisinopril; Sulfonamide derivatives; Tanzeum; and Trulicity    Social History:  The patient  reports that she has never smoked. She does not have any smokeless tobacco history on file. She reports that she does not drink alcohol or use illicit drugs.   Family History:  The patient's family history is not on file.    ROS:  Please see the history of present illness.   Otherwise, review of systems are positive for none.   All other systems are reviewed and negative.    PHYSICAL EXAM: VS:  BP  110/80 mmHg  Pulse 101  Ht 4\' 11"  (1.499 m)  Wt 66.225 kg (146 lb)  BMI 29.47 kg/m2  SpO2 97% , BMI Body mass index is 29.47 kg/(m^2). Affect appropriate Healthy:  appears stated age 42: normal Neck supple with no adenopathy JVP normal no bruits no thyromegaly Lungs clear with no wheezing and good diaphragmatic motion Heart:  S1/S2 no murmur, no rub, gallop or click PMI normal Abdomen: benighn, BS positve, no tenderness, no AAA no bruit.  No HSM or HJR Distal pulses intact with no bruits No edema Neuro non-focal Skin warm and dry No muscular weakness    EKG:  SR rate 97 normal    Recent Labs: No results found for requested labs within last 365 days.    Lipid Panel No results found for: CHOL, TRIG, HDL, CHOLHDL, VLDL, LDLCALC,  LDLDIRECT    Wt Readings from Last 3 Encounters:  06/23/15 66.225 kg (146 lb)  08/19/09 71.442 kg (157 lb 8 oz)  08/04/09 71.215 kg (157 lb)      Other studies Reviewed: Additional studies/ records that were reviewed today include: Primary care notes, labs and ECG .    ASSESSMENT AND PLAN:  1.  Tachycardia:  D/c cardizem start lopressor.  Check echo for RV/LV function Likely related to post viral syndrome with high adrenergic tone and long standing DM with dysautonomia.  2. Dyspnea: initially related to viral syndrome. Normal cardiopulmonary exam. Check echo and BNP.  May need f/u high res CT and or cardiopulmonary stress test 3. DM  Discussed low carb diet.  Target hemoglobin A1c is 6.5 or less.  Continue current medications. 4. Allergies: stop zyrtec use allegra former has been associated with tachycardia   Current medicines are reviewed at length with the patient today.  The patient does not have concerns regarding medicines.  The following changes have been made:  D/c zyrtec and cardizem start lopressor   Labs/ tests ordered today include: echo labs BMET BNP and TSH  Orders Placed This Encounter  Procedures  . Brain natriuretic peptide  . Basic metabolic panel  . T4, free  . TSH  . EKG 12-Lead  . ECHOCARDIOGRAM COMPLETE     Disposition:   FU with me after tests next available     Signed, Jenkins Rouge, MD  06/23/2015 10:17 AM    [facility] [place], [place], [place]  [address] Phone: [phone]; Fax: [phone]

## 2015-06-23 ENCOUNTER — Ambulatory Visit (INDEPENDENT_AMBULATORY_CARE_PROVIDER_SITE_OTHER): Payer: 59 | Admitting: Cardiovascular Disease

## 2015-06-23 ENCOUNTER — Encounter: Payer: Self-pay | Admitting: Cardiovascular Disease

## 2015-06-23 VITALS — BP 110/80 | HR 101 | Ht 59.0 in | Wt 146.0 lb

## 2015-06-23 DIAGNOSIS — Z7689 Persons encountering health services in other specified circumstances: Secondary | ICD-10-CM

## 2015-06-23 DIAGNOSIS — Z7189 Other specified counseling: Secondary | ICD-10-CM

## 2015-06-23 DIAGNOSIS — R000 Tachycardia, unspecified: Secondary | ICD-10-CM

## 2015-06-23 DIAGNOSIS — R0600 Dyspnea, unspecified: Secondary | ICD-10-CM

## 2015-06-23 LAB — BASIC METABOLIC PANEL
BUN: 11 mg/dL (ref 7–25)
CHLORIDE: 104 mmol/L (ref 98–110)
CO2: 23 mmol/L (ref 20–31)
Calcium: 9 mg/dL (ref 8.6–10.2)
Creat: 0.57 mg/dL (ref 0.50–1.10)
GLUCOSE: 125 mg/dL — AB (ref 65–99)
Potassium: 3.8 mmol/L (ref 3.5–5.3)
SODIUM: 138 mmol/L (ref 135–146)

## 2015-06-23 LAB — TSH: TSH: 1.22 mIU/L

## 2015-06-23 LAB — T4, FREE: Free T4: 1.4 ng/dL (ref 0.8–1.8)

## 2015-06-23 LAB — BRAIN NATRIURETIC PEPTIDE: BRAIN NATRIURETIC PEPTIDE: 9.4 pg/mL (ref <100)

## 2015-06-23 MED ORDER — METOPROLOL TARTRATE 25 MG PO TABS: 25.0000 mg | ORAL_TABLET | Freq: Two times a day (BID) | ORAL | Status: DC

## 2015-06-23 NOTE — Patient Instructions (Signed)
Medication Instructions:  Your physician has recommended you make the following change in your medication:  1-STOP Cardizem 2-STOP Zyrtex 3-START Metoprolol 25 mg by mouth twice daily  Labwork: Your physician recommends that you have lab work today BMET, BNP, TSH, and T4   Testing/Procedures: Your physician has requested that you have an echocardiogram. Echocardiography is a painless test that uses sound waves to create images of your heart. It provides your doctor with information about the size and shape of your heart and how well your heart's chambers and valves are working. This procedure takes approximately one hour. There are no restrictions for this procedure.  Follow-Up: Your physician wants you to follow-up next available with Dr. Johnsie Cancel   If you need a refill on your cardiac medications before your next appointment, please call your pharmacy.

## 2015-06-24 ENCOUNTER — Ambulatory Visit: Payer: BLUE CROSS/BLUE SHIELD | Admitting: Cardiology

## 2015-06-24 ENCOUNTER — Telehealth: Payer: Self-pay | Admitting: Cardiovascular Disease

## 2015-06-24 NOTE — Telephone Encounter (Signed)
Follow Up ° °Pt returning call from earlier. Please call. °

## 2015-06-24 NOTE — Telephone Encounter (Signed)
New message   Pt is returning a call that was left from the rn  She did not say what it was pertaining too

## 2015-06-24 NOTE — Telephone Encounter (Signed)
Left message for patient to call back. Called patient about her lab results. Results have been sent to [facility]. No changes to any medications at this time. Per Dr. Johnsie Cancel, labs are good.

## 2015-06-24 NOTE — Telephone Encounter (Signed)
Left message for patient to call back, unless she does not have any questions about her lab work that has resulted on [place].

## 2015-07-01 ENCOUNTER — Encounter: Payer: Self-pay | Admitting: Family Medicine

## 2015-07-07 ENCOUNTER — Other Ambulatory Visit: Payer: Self-pay

## 2015-07-07 ENCOUNTER — Ambulatory Visit (HOSPITAL_COMMUNITY): Payer: 59 | Attending: Cardiovascular Disease

## 2015-07-07 DIAGNOSIS — R0600 Dyspnea, unspecified: Secondary | ICD-10-CM | POA: Diagnosis present

## 2015-07-07 DIAGNOSIS — I10 Essential (primary) hypertension: Secondary | ICD-10-CM | POA: Diagnosis not present

## 2015-07-07 DIAGNOSIS — E785 Hyperlipidemia, unspecified: Secondary | ICD-10-CM | POA: Insufficient documentation

## 2015-07-07 DIAGNOSIS — E119 Type 2 diabetes mellitus without complications: Secondary | ICD-10-CM | POA: Diagnosis not present

## 2015-07-07 DIAGNOSIS — R000 Tachycardia, unspecified: Secondary | ICD-10-CM | POA: Diagnosis not present

## 2015-07-11 ENCOUNTER — Encounter: Payer: Self-pay | Admitting: Cardiovascular Disease

## 2015-07-11 ENCOUNTER — Ambulatory Visit (INDEPENDENT_AMBULATORY_CARE_PROVIDER_SITE_OTHER): Payer: 59 | Admitting: Cardiovascular Disease

## 2015-07-11 VITALS — BP 100/66 | HR 96 | Ht 59.0 in | Wt 145.8 lb

## 2015-07-11 DIAGNOSIS — Z09 Encounter for follow-up examination after completed treatment for conditions other than malignant neoplasm: Secondary | ICD-10-CM | POA: Diagnosis not present

## 2015-07-11 NOTE — Progress Notes (Signed)
Patient ID: Amy Clarke, female   DOB: 23-Dec-1970, 45 y.o.   MRN: [id number]     Cardiology Office Note   Date:  07/11/2015   ID:  [name], DOB Jul 27, 1970, MRN [id number]  PCP:  Mayra Neer, MD  Cardiologist:   Jenkins Rouge, MD   Chief Complaint  Patient presents with  . Follow-up    no sx      History of Present Illness: Amy Clarke is a 45 y.o. female who presents for evaluation of tachycardia CRF:s DM, HTN and elevated lipids Seen by Dr Brigitte Pulse 5/24 Asthmatic with constant chest pressure that worsens with exertion and going outside since April. Been Rx with amoxicillin, augmentin and prednisone some help. Also has symbicort and albuterol. Pressure also present at rest Noted palpiatations Started on cardizem and norvasc stopped  ECG 06/22/15  Narrow complex tachycardia rate 139 sinus vs long RP tachycardia   She feels some better today. Not wheezing .  Changed to zyrtec from claritin for allergies Family history positive for father having MI in 66's.  Thyroid not checked recently ECG today shows SR She has significant sinus arrhythmia   Last visit stopped cardizem and zyrtec.  Started beta blocker LABS:  06/23/15 reviewed normal including TSH and BNP Echo 06/23/15 Normal EF 55-60% no valve DX  Seems to be doing better less dyspnea no palpitations  Going to [employer] for vacation Told her it was ok to drink in moderation   Past Medical History  Diagnosis Date  . Diabetes ([place])   . Hypertension   . Hyperlipidemia   . Asthma   . Kidney stones   . Migraine headache   . Kidney cysts   . Depression   . GERD (gastroesophageal reflux disease)     Past Surgical History  Procedure Laterality Date  . None       Current Outpatient Prescriptions  Medication Sig Dispense Refill  . Albuterol Sulfate 108 (90 Base) MCG/ACT AEPB Inhale 2 puffs into the lungs as needed (as needed inhalation).    Marland Kitchen atorvastatin (LIPITOR) 80 MG tablet Take 80 mg by mouth daily.    .  Beclomethasone Dipropionate (QNASL) 80 MCG/ACT AERS Place 2 sprays into the nose daily.    . budesonide-formoterol (SYMBICORT) 160-4.5 MCG/ACT inhaler Inhale 2 puffs into the lungs 2 (two) times daily.    Marland Kitchen buPROPion (WELLBUTRIN XL) 150 MG 24 hr tablet Take 150 mg by mouth daily. Take 2 tablets orally once a day    . glimepiride (AMARYL) 4 MG tablet Take 4 mg by mouth daily with breakfast.    . metFORMIN (GLUCOPHAGE) 1000 MG tablet Take 1,000 mg by mouth 2 (two) times daily with a meal.    . metoprolol tartrate (LOPRESSOR) 25 MG tablet Take 1 tablet (25 mg total) by mouth 2 (two) times daily. 180 tablet 3  . omeprazole (PRILOSEC) 20 MG capsule Take 20 mg by mouth daily as needed.    . valsartan (DIOVAN) 320 MG tablet Take 320 mg by mouth daily.    . Vitamin D, Cholecalciferol, 1000 units CAPS Take 1 capsule by mouth daily.     No current facility-administered medications for this visit.    Allergies:   Kelle Darting; Lisinopril; Sulfonamide derivatives; Tanzeum; and Trulicity    Social History:  The patient  reports that she has never smoked. She does not have any smokeless tobacco history on file. She reports that she does not drink alcohol or use illicit drugs.  Family History:  The patient's family history is not on file.    ROS:  Please see the history of present illness.   Otherwise, review of systems are positive for none.   All other systems are reviewed and negative.    PHYSICAL EXAM: VS:  BP 100/66 mmHg  Pulse 96  Ht 4\' 11"  (1.499 m)  Wt 66.134 kg (145 lb 12.8 oz)  BMI 29.43 kg/m2  SpO2 99% , BMI Body mass index is 29.43 kg/(m^2). Affect appropriate Healthy:  appears stated age 63: normal Neck supple with no adenopathy JVP normal no bruits no thyromegaly Lungs clear with no wheezing and good diaphragmatic motion Heart:  S1/S2 no murmur, no rub, gallop or click PMI normal Abdomen: benighn, BS positve, no tenderness, no AAA no bruit.  No HSM or HJR Distal pulses  intact with no bruits No edema Neuro non-focal Skin warm and dry No muscular weakness    EKG:  SR rate 97 normal    Recent Labs: 06/23/2015: Brain Natriuretic Peptide 9.4; BUN 11; Creat 0.57; Potassium 3.8; Sodium 138; TSH 1.22    Lipid Panel No results found for: CHOL, TRIG, HDL, CHOLHDL, VLDL, LDLCALC, LDLDIRECT    Wt Readings from Last 3 Encounters:  07/11/15 66.134 kg (145 lb 12.8 oz)  06/23/15 66.225 kg (146 lb)  08/19/09 71.442 kg (157 lb 8 oz)      Other studies Reviewed: Additional studies/ records that were reviewed today include: Primary care notes, labs and ECG .    ASSESSMENT AND PLAN:  1.  Tachycardia:  Likely related to post viral syndrome with high adrenergic tone and long standing DM with dysautonomia. Labs and echo normal continue beta blocker  2. Dyspnea: initially related to viral syndrome. Normal cardiopulmonary exam. Normal echo and BNP.  May need f/u high res CT and or cardiopulmonary stress test 3. DM  Discussed low carb diet.  Target hemoglobin A1c is 6.5 or less.  Continue current medications. 4. Allergies: Avoid  zyrtec use allegra former has been associated with tachycardia   Current medicines are reviewed at length with the patient today.  The patient does not have concerns regarding medicines.  The following changes have been made:    Labs/ tests ordered today include:   No orders of the defined types were placed in this encounter.     Disposition:   FU with me 6 months      Signed, Jenkins Rouge, MD  07/11/2015 2:11 PM    [facility] [place], [place], [place]  [address] Phone: [phone]; Fax: [phone]

## 2015-07-11 NOTE — Patient Instructions (Signed)

## 2015-10-21 ENCOUNTER — Telehealth: Payer: Self-pay | Admitting: Cardiovascular Disease

## 2015-10-21 ENCOUNTER — Ambulatory Visit (INDEPENDENT_AMBULATORY_CARE_PROVIDER_SITE_OTHER): Payer: 59 | Admitting: Physician Assistant

## 2015-10-21 ENCOUNTER — Encounter: Payer: Self-pay | Admitting: Physician Assistant

## 2015-10-21 ENCOUNTER — Other Ambulatory Visit: Payer: 59 | Admitting: *Deleted

## 2015-10-21 VITALS — BP 142/94 | HR 83 | Ht 59.0 in | Wt 146.1 lb

## 2015-10-21 DIAGNOSIS — I10 Essential (primary) hypertension: Secondary | ICD-10-CM | POA: Diagnosis not present

## 2015-10-21 DIAGNOSIS — R000 Tachycardia, unspecified: Secondary | ICD-10-CM | POA: Diagnosis not present

## 2015-10-21 DIAGNOSIS — R0600 Dyspnea, unspecified: Secondary | ICD-10-CM | POA: Diagnosis not present

## 2015-10-21 DIAGNOSIS — R0789 Other chest pain: Secondary | ICD-10-CM

## 2015-10-21 DIAGNOSIS — F419 Anxiety disorder, unspecified: Secondary | ICD-10-CM

## 2015-10-21 LAB — TROPONIN I

## 2015-10-21 MED ORDER — NITROGLYCERIN 0.4 MG SL SUBL: 0.4000 mg | SUBLINGUAL_TABLET | SUBLINGUAL | 3 refills | Status: DC | PRN

## 2015-10-21 NOTE — Telephone Encounter (Signed)
Pt c/o intermittent chest tightness x 2 days with an occasional pain.  These episodes lasted 5-15 mins each.  Today symptoms are constant with no relief.  Denies SOB or blurred vision.  Pt c/o fatigue, HA, nausea and felt like she was going to vomit but hasn't.  Pt states these symptoms are similar to the ones she had previously and it was due to stress.  Pt states she does have a lot of stress in her life currently.  Pt has been checking her BP since last night and states readings have been 140's/low 100's.  Spoke with [employer], PA and he said he would see pt today at 2:15pm.  He would like for pt to have STAT Troponin as soon as she arrives at office. Spoke with pt and advised her of appt and labs as soon as she arrives.  Pt verbalized understanding and was in agreement with this plan.

## 2015-10-21 NOTE — Telephone Encounter (Signed)
Pt aware of her lab results and verbalized understanding

## 2015-10-21 NOTE — Progress Notes (Signed)
Cardiology Office Note    Date:  10/21/2015   ID:  Amy Clarke, DOB April 15, 1970, MRN [id number]  PCP:  Amy Neer, MD  Cardiologist:  Dr. Johnsie Cancel  Chief Complaint: Chest pain   History of Present Illness:   Amy Clarke is a 45 y.o. female HTN, HLD, tachycardia, DM and elevated lipid who added to schedule for chest pain.   Echo 06/23/15 Normal EF 55-60% no valve DX.  Recently seen by Dr. Johnsie Cancel 06/2015 for palpitations and asthmatic with chest pressure. ECG  shows SR She has significant sinus arrhythmia. Her tachycardia felt likely from post viral syndrome with high adrenergic tone. Continued BB. May need f/u high res CT and/or cardiopulmonary stress test.  Here today for chest pain. She said that she been having left mid chest pain for the past 3 days. She describes the pain as constant "dull tightness". Its been t 3-4/10 however intermittently intensifies to 7/10 at times feels like "stabbing". Occasional associated nausea and dizziness. Today her symptoms is more severe and constant. Had one episode this morning while she felt tingling on her face. No radiation of pain. No orthopnea, pnd, syncope or LE edema. Her son had car rack 3 days ago after that her symptoms began. She is under lot of stress since then. Nothing make it worse or better.   Past Medical History:  Diagnosis Date  . Asthma   . Depression   . Diabetes ([place])   . GERD (gastroesophageal reflux disease)   . Hyperlipidemia   . Hypertension   . Kidney cysts   . Kidney stones   . Migraine headache     Past Surgical History:  Procedure Laterality Date  . none      Current Medications: Prior to Admission medications   Medication Sig Start Date End Date Taking? Authorizing Provider  Albuterol Sulfate 108 (90 Base) MCG/ACT AEPB Inhale 2 puffs into the lungs as needed (as needed inhalation).    Historical Provider, MD  atorvastatin (LIPITOR) 80 MG tablet Take 80 mg by mouth daily.    Historical Provider, MD    Beclomethasone Dipropionate (QNASL) 80 MCG/ACT AERS Place 2 sprays into the nose daily.    Historical Provider, MD  budesonide-formoterol (SYMBICORT) 160-4.5 MCG/ACT inhaler Inhale 2 puffs into the lungs 2 (two) times daily.    Historical Provider, MD  buPROPion (WELLBUTRIN XL) 150 MG 24 hr tablet Take 150 mg by mouth daily. Take 2 tablets orally once a day    Historical Provider, MD  glimepiride (AMARYL) 4 MG tablet Take 4 mg by mouth daily with breakfast.    Historical Provider, MD  metFORMIN (GLUCOPHAGE) 1000 MG tablet Take 1,000 mg by mouth 2 (two) times daily with a meal.    Historical Provider, MD  metoprolol tartrate (LOPRESSOR) 25 MG tablet Take 1 tablet (25 mg total) by mouth 2 (two) times daily. 06/23/15   Josue Hector, MD  omeprazole (PRILOSEC) 20 MG capsule Take 20 mg by mouth daily as needed.    Historical Provider, MD  valsartan (DIOVAN) 320 MG tablet Take 320 mg by mouth daily.    Historical Provider, MD  Vitamin D, Cholecalciferol, 1000 units CAPS Take 1 capsule by mouth daily.    Historical Provider, MD    Allergies:   Farxiga [dapagliflozin]; Januvia [sitagliptin]; Lisinopril; Sulfonamide derivatives; Tanzeum [albiglutide]; and Trulicity [dulaglutide]   Social History   Social History  . Marital status: Married    Spouse name: N/A  . Number of children: 4  .  Years of education: N/A   Occupational History  . unemployed    Social History Main Topics  . Smoking status: Never Smoker  . Smokeless tobacco: None  . Alcohol use No  . Drug use: No  . Sexual activity: Not Asked   Other Topics Concern  . None   Social History Narrative  . None     Family History:  The patient's family history is not on file. Biological father had MI at age 53.   ROS:   Please see the history of present illness.    ROS All other systems reviewed and are negative.   PHYSICAL EXAM:   VS:  BP (!) 142/94   Pulse 83   Ht 4\' 11"  (1.499 m)   Wt 146 lb 1.9 oz (66.3 kg)   BMI 29.51  kg/m    GEN: Well nourished, well developed, in no acute distress however seems extremely anxious and nervous, She thinks that she is having "heart attack".  HEENT: normal  Neck: no JVD, carotid bruits, or masses Cardiac RRR; no murmurs, rubs, or gallops,no edema. Pain is reproducible with palpation at left middle chest Respiratory:  clear to auscultation bilaterally, normal work of breathing GI: soft, nontender, nondistended, + BS MS: no deformity or atrophy  Skin: warm and dry, no rash Neuro:  Alert and Oriented x 3, Strength and sensation are intact Psych: euthymic mood, full affect  Wt Readings from Last 3 Encounters:  10/21/15 146 lb 1.9 oz (66.3 kg)  07/11/15 145 lb 12.8 oz (66.1 kg)  06/23/15 146 lb (66.2 kg)      Studies/Labs Reviewed:   EKG:  EKG is ordered today.  The ekg ordered today demonstrates sinus rhythm at rate of 83 bpm. No change compared to prior EKG.   Recent Labs: 06/23/2015: Brain Natriuretic Peptide 9.4; BUN 11; Creat 0.57; Potassium 3.8; Sodium 138; TSH 1.22   Lipid Panel No results found for: CHOL, TRIG, HDL, CHOLHDL, VLDL, LDLCALC, LDLDIRECT  Additional studies/ records that were reviewed today include:   Echocardiogram: 07/07/15 LV EF: 55% -   60%  ------------------------------------------------------------------- Indications:      Dyspnea (R06.00).  ------------------------------------------------------------------- History:   PMH:  Tachycardia, Asthma, Palpitations  Chest pain.  Dyspnea.  Risk factors:  Hypertension. Diabetes mellitus. Dyslipidemia.  ------------------------------------------------------------------- Study Conclusions  - Left ventricle: The cavity size was normal. Systolic function was   normal. The estimated ejection fraction was in the range of 55%   to 60%. Wall motion was normal; there were no regional wall   motion abnormalities. Left ventricular diastolic function   parameters were normal. - Atrial septum: No  defect or patent foramen ovale was identified.     ASSESSMENT & PLAN:    !. Chest tightness - Atypical. Reproducible with palpation. Different from her GERD and asthmatic pain. Most likely her symptoms due to severe anxiety from her recent social situation. EKG without acute ischemic changes even after 3 days of constant pain. Will get stat troponin. Trial of PRN SL nitro. IF pain worsens, advised to go to ER. She aggress with plan.   2. Hx of dyspnea - this episode is different. Dr. Johnsie Cancel mention "May need f/u high res CT and or cardiopulmonary stress test" . Will sent him staff message.   3. Hx of tachycardia - No further episode recently.   4. HTN - stable. Continue current regimen.    Medication Adjustments/Labs and Tests Ordered: Current medicines are reviewed at length with the patient today.  Concerns regarding medicines are outlined above.  Medication changes, Labs and Tests ordered today are listed in the Patient Instructions below. There are no Patient Instructions on file for this visit.   Jarrett Soho, [place]  10/21/2015 2:25 PM    [facility] [place], [place], [place]  [address] Phone: [phone]; Fax: [phone]

## 2015-10-21 NOTE — Patient Instructions (Addendum)
Medication Instructions:  Your physician has recommended you make the following change in your medication:  1.  START Nitroglycerin 0.4 s/l tablet USE AS DIRECTED ON THE BOTTLE   Labwork: None ordered  Testing/Procedures: None ordered  Follow-Up: Your physician recommends that you schedule a follow-up appointment in: 3 MONTHS WITH DR. Johnsie Cancel   Any Other Special Instructions Will Be Listed Below (If Applicable).  If you need a refill on your cardiac medications before your next appointment, please call your pharmacy.

## 2015-10-21 NOTE — Telephone Encounter (Signed)
Follow Up ° ° ° ° °Pt is returning call from earlier. Please call. °

## 2015-10-21 NOTE — Telephone Encounter (Signed)
New Message  Pt c/o of Chest Pain: STAT if CP now or developed within 24 hours  1. Are you having CP right now? Per pt yes mid pains   2. Are you experiencing any other symptoms (ex. SOB, nausea, vomiting, sweating)?per pt  Nausea. Tired   3. How long have you been experiencing CP? Chest pain for a couple of of days but consistent today  4. Is your CP continuous or coming and going? Per pt coming and going for the pass couple of days, but today not going away   5. Have you taken Nitroglycerin?no per pt takes Metoprolol 25mg  for bp ?

## 2015-11-30 ENCOUNTER — Telehealth: Payer: Self-pay

## 2015-11-30 NOTE — Telephone Encounter (Signed)
Called patient to schedule an appointment for the first of the year with Dr. Johnsie Cancel, per last office note with Leanor Kail PA.

## 2015-12-05 NOTE — Telephone Encounter (Signed)
Patient has appointment in February for office visit with Dr. Johnsie Cancel.

## 2016-02-08 DIAGNOSIS — E1122 Type 2 diabetes mellitus with diabetic chronic kidney disease: Secondary | ICD-10-CM | POA: Diagnosis not present

## 2016-02-08 DIAGNOSIS — E1165 Type 2 diabetes mellitus with hyperglycemia: Secondary | ICD-10-CM | POA: Diagnosis not present

## 2016-02-08 DIAGNOSIS — Z0000 Encounter for general adult medical examination without abnormal findings: Secondary | ICD-10-CM | POA: Diagnosis not present

## 2016-02-09 DIAGNOSIS — R319 Hematuria, unspecified: Secondary | ICD-10-CM | POA: Diagnosis not present

## 2016-03-01 ENCOUNTER — Ambulatory Visit: Payer: 59 | Admitting: Cardiovascular Disease

## 2016-03-11 DIAGNOSIS — B349 Viral infection, unspecified: Secondary | ICD-10-CM | POA: Diagnosis not present

## 2016-03-15 NOTE — Progress Notes (Signed)
Patient ID: Amy Clarke, female   DOB: [birth year], 46 y.o.   MRN: [id number]     Cardiology Office Note   Date:  03/23/2016   ID:  Amy Clarke, DOB 05/09/1970, MRN [id number]  PCP:  Mayra Neer, MD  Cardiologist:   Jenkins Rouge, MD   Chief Complaint  Patient presents with  . 6 month f/u      History of Present Illness: Amy Clarke is a 46 y.o. female initially seen June 2017  for evaluation of tachycardia CRF:s DM, HTN and elevated lipids Seen by Dr Brigitte Pulse 06/22/15 Asthmatic with constant chest pressure that worsens with exertion and going outside since April. Been Rx with amoxicillin, augmentin and prednisone some help. Also has symbicort and albuterol. Pressure also present at rest Noted palpiatations Started on cardizem and norvasc stopped  ECG 06/22/15  Narrow complex tachycardia rate 139 sinus vs long RP tachycardia   Family history positive for father having MI in 69's.  Thyroid not checked recently   Stopped cardizem and zyrtec.  Started beta blocker LABS:  06/23/15 reviewed normal including TSH and BNP Echo 06/23/15 Normal EF 55-60% no valve DX  Seen by PA 10/21/15 with atypical chest pain that began 3 days after her son had a car wreck Troponin negative no further w/u done   Past Medical History:  Diagnosis Date  . Asthma   . Depression   . Diabetes ([place])   . GERD (gastroesophageal reflux disease)   . Hyperlipidemia   . Hypertension   . Kidney cysts   . Kidney stones   . Migraine headache     Past Surgical History:  Procedure Laterality Date  . none       Current Outpatient Prescriptions  Medication Sig Dispense Refill  . Albuterol Sulfate 108 (90 Base) MCG/ACT AEPB Inhale 2 puffs into the lungs as needed (as needed inhalation).    Marland Kitchen atorvastatin (LIPITOR) 80 MG tablet Take 80 mg by mouth daily.    . Beclomethasone Dipropionate (QNASL) 80 MCG/ACT AERS Place 2 sprays into the nose daily.    . budesonide-formoterol (SYMBICORT) 160-4.5 MCG/ACT inhaler  Inhale 2 puffs into the lungs 2 (two) times daily.    . metoprolol tartrate (LOPRESSOR) 25 MG tablet Take 1 tablet (25 mg total) by mouth 2 (two) times daily. 180 tablet 3  . omeprazole (PRILOSEC) 20 MG capsule Take 20 mg by mouth daily as needed.    . valsartan (DIOVAN) 320 MG tablet Take 320 mg by mouth daily.    Marland Kitchen venlafaxine XR (EFFEXOR-XR) 37.5 MG 24 hr capsule Take 37.5 mg by mouth daily.    . Vitamin D, Cholecalciferol, 1000 units CAPS Take 1 capsule by mouth daily.    . nitroGLYCERIN (NITROSTAT) 0.4 MG SL tablet Place 1 tablet (0.4 mg total) under the tongue every 5 (five) minutes as needed for chest pain. 25 tablet 3   No current facility-administered medications for this visit.     Allergies:   Farxiga [dapagliflozin]; Januvia [sitagliptin]; Lisinopril; Sulfonamide derivatives; Tanzeum [albiglutide]; and Trulicity [dulaglutide]    Social History:  The patient  reports that she has never smoked. She has never used smokeless tobacco. She reports that she does not drink alcohol or use drugs.   Family History:  The patient's family history is not on file.    ROS:  Please see the history of present illness.   Otherwise, review of systems are positive for none.   All other systems are reviewed and negative.  PHYSICAL EXAM: VS:  BP 136/84   Pulse 71   Ht 4\' 11"  (1.499 m)   Wt 150 lb 6.4 oz (68.2 kg)   SpO2 98%   BMI 30.38 kg/m  , BMI Body mass index is 30.38 kg/m. Affect appropriate Healthy:  appears stated age 61: normal Neck supple with no adenopathy JVP normal no bruits no thyromegaly Lungs clear with no wheezing and good diaphragmatic motion Heart:  S1/S2 no murmur, no rub, gallop or click PMI normal Abdomen: benighn, BS positve, no tenderness, no AAA no bruit.  No HSM or HJR Distal pulses intact with no bruits No edema Neuro non-focal Skin warm and dry No muscular weakness    EKG:  10/21/15  SR rate 97 normal    Recent Labs: 06/23/2015: Brain  Natriuretic Peptide 9.4; BUN 11; Creat 0.57; Potassium 3.8; Sodium 138; TSH 1.22    Lipid Panel No results found for: CHOL, TRIG, HDL, CHOLHDL, VLDL, LDLCALC, LDLDIRECT    Wt Readings from Last 3 Encounters:  03/23/16 150 lb 6.4 oz (68.2 kg)  10/21/15 146 lb 1.9 oz (66.3 kg)  07/11/15 145 lb 12.8 oz (66.1 kg)      Other studies Reviewed: Additional studies/ records that were reviewed today include: Primary care notes, labs and ECG .    ASSESSMENT AND PLAN:  1.  Tachycardia:  Likely related to post viral syndrome with high adrenergic tone and long standing DM with dysautonomia. Labs and echo normal continue beta blocker  2. Dyspnea: initially related to viral syndrome. Normal cardiopulmonary exam. Normal echo and BNP.  May need f/u high res CT and or cardiopulmonary stress test 3. DM  Discussed low carb diet.  Target hemoglobin A1c is 6.5 or less.  Continue current medications. 4. Allergies: Avoid  zyrtec use allegra former has been associated with tachycardia 5. Chest Pain:  Given DM will order ETT as baseline ECG is normal   Current medicines are reviewed at length with the patient today.  The patient does not have concerns regarding medicines.  The following changes have been made:    Labs/ tests ordered today include:   Orders Placed This Encounter  Procedures  . EXERCISE TOLERANCE TEST     Disposition:   FU with me 6 months      Signed, Jenkins Rouge, MD  03/23/2016 4:52 PM    [facility] [address], [place], [place]  [address] Phone: [phone]; Fax: [phone]

## 2016-03-23 ENCOUNTER — Encounter: Payer: Self-pay | Admitting: Cardiovascular Disease

## 2016-03-23 ENCOUNTER — Ambulatory Visit (INDEPENDENT_AMBULATORY_CARE_PROVIDER_SITE_OTHER): Payer: 59 | Admitting: Cardiovascular Disease

## 2016-03-23 VITALS — BP 136/84 | HR 71 | Ht 59.0 in | Wt 150.4 lb

## 2016-03-23 DIAGNOSIS — I10 Essential (primary) hypertension: Secondary | ICD-10-CM

## 2016-03-23 DIAGNOSIS — R079 Chest pain, unspecified: Secondary | ICD-10-CM | POA: Diagnosis not present

## 2016-03-23 NOTE — Patient Instructions (Addendum)
Medication Instructions:  Your physician recommends that you continue on your current medications as directed. Please refer to the Current Medication list given to you today.  Labwork: NONE  Testing/Procedures: Your physician has requested that you have an exercise tolerance test. For further information please visit [email or web address]. Please also follow instruction sheet, as given.  Follow-Up: Your physician wants you to follow-up in: 12 months with Dr. Nishan. You will receive a reminder letter in the mail two months in advance. If you don't receive a letter, please call our office to schedule the follow-up appointment.   If you need a refill on your cardiac medications before your next appointment, please call your pharmacy.    

## 2016-04-27 ENCOUNTER — Ambulatory Visit (INDEPENDENT_AMBULATORY_CARE_PROVIDER_SITE_OTHER): Payer: 59

## 2016-04-27 DIAGNOSIS — I10 Essential (primary) hypertension: Secondary | ICD-10-CM | POA: Diagnosis not present

## 2016-04-27 DIAGNOSIS — R079 Chest pain, unspecified: Secondary | ICD-10-CM | POA: Diagnosis not present

## 2016-04-27 LAB — EXERCISE TOLERANCE TEST
CHL CUP STRESS STAGE 1 HR: 88 {beats}/min
CHL CUP STRESS STAGE 1 SBP: 117 mmHg
CHL CUP STRESS STAGE 1 SPEED: 0 mph
CHL CUP STRESS STAGE 2 GRADE: 0 %
CHL CUP STRESS STAGE 2 HR: 96 {beats}/min
CHL CUP STRESS STAGE 4 GRADE: 10 %
CHL CUP STRESS STAGE 4 HR: 130 {beats}/min
CHL CUP STRESS STAGE 5 DBP: 82 mmHg
CHL CUP STRESS STAGE 5 GRADE: 12 %
CHL CUP STRESS STAGE 5 SBP: 164 mmHg
CHL CUP STRESS STAGE 5 SPEED: 2.5 mph
CHL CUP STRESS STAGE 6 GRADE: 14 %
CHL CUP STRESS STAGE 6 HR: 173 {beats}/min
CHL CUP STRESS STAGE 7 HR: 142 {beats}/min
CHL CUP STRESS STAGE 7 SBP: 163 mmHg
CHL CUP STRESS STAGE 7 SPEED: 0 mph
CHL CUP STRESS STAGE 8 HR: 97 {beats}/min
CSEPED: 8 min
CSEPEW: 10.1 METS
CSEPPHR: 173 {beats}/min
CSEPPMHR: 100 %
Exercise duration (sec): 0 s
MPHR: 173 {beats}/min
Percent HR: 100 %
RPE: 17
Rest HR: 77 {beats}/min
Stage 1 DBP: 81 mmHg
Stage 1 Grade: 0 %
Stage 2 Speed: 1 mph
Stage 3 Grade: 0.1 %
Stage 3 HR: 96 {beats}/min
Stage 3 Speed: 1 mph
Stage 4 DBP: 71 mmHg
Stage 4 SBP: 129 mmHg
Stage 4 Speed: 1.7 mph
Stage 5 HR: 142 {beats}/min
Stage 6 Speed: 3.4 mph
Stage 7 DBP: 62 mmHg
Stage 7 Grade: 0 %
Stage 8 Grade: 0 %
Stage 8 Speed: 0 mph

## 2016-06-28 ENCOUNTER — Other Ambulatory Visit: Payer: Self-pay | Admitting: Cardiovascular Disease

## 2016-08-07 ENCOUNTER — Ambulatory Visit: Payer: 59 | Admitting: Endocrinology

## 2016-09-06 NOTE — Progress Notes (Signed)
Patient ID: Amy Clarke, female   DOB: 1970/10/19, 46 y.o.   MRN: [id number]           Reason for Appointment: Consultation for Type 2 Diabetes  Referring physician: Mayra Neer   History of Present Illness:          Date of diagnosis of type 2 diabetes mellitus: 2003?         Background history:   She had gestational diabetes in 1992 and subsequently was on diet alone She thinks she was started on diabetes medication about 15 years ago and probably took metformin which she took until about a year ago and this was stopped because of diarrhea.  Records show that she was taking 1000 mg of regular metformin twice a day With her PCP she has been on numerous diabetes medications over the years including By[place], Trulicity, Jardiance and Januvia which apparently all caused side effects, mostly nausea or yeast infections with Vania Rea and Farxiga Her A1c has been mostly higher this year, last year has been as low as 7.2, in January her A1c was 8.4  Recent history:   INSULIN regimen is:  [place] U-200, 80 bid.   Non-insulin hypoglycemic drugs the patient is taking are: Steglatro 5 mg daily  Her A1c has been checked in July and was 9.3 from her PCP  Current management, blood sugar patterns and problems identified:  She was started on insulin in 09/2015 with low doses of Tresiba and this has been progressively increased  Since about 5/18 she has been taking 80 units twice a day  Also about a month ago she was started on Steglatro 5 mg daily  She thinks her blood sugars are somewhat better compared to 3 months ago when they were mostly over 200 fasting  She checks blood sugars only in the morning since she has a prescription for 30 test strips per month only  Her lab glucose has been as high as 370 in 5/18  She says she does feel tired consistently and also is complaining of increased thirst and urination  She has generally tried to watch her diet with portions and reducing  high-fat foods  Recently because of having fatigue and sleepiness she does not do any exercise           Side effects from medications have been: Diarhea with regular metformin  Compliance with the medical regimen: Fairly good Hypoglycemia:   only if she is doing a lot of exercise  Glucose monitoring:  done  times a day         Glucometer: One Touch.      Blood Glucose readings by recall as above  Self-care: The diet that the patient has been following is: tries to limit high-fat foods and drinks with sugar .     Typical meal intake: Breakfast is sometimes cereal/bagel/granola Bar.  Usually eating breakfast only if she is working [employer] usually yogurt, cheese, fruit and crackers.  Dinner chicken with vegetables.  She will have snacks with granola bar or popcorn                Dietician visit, most recent: Several years ago in class               Exercise: off and on, currently not doing much, tends to be more active while at work with walking   Weight history:  Wt Readings from Last 3 Encounters:  09/07/16 157 lb (71.2 kg)  03/23/16 150 lb 6.4  oz (68.2 kg)  10/21/15 146 lb 1.9 oz (66.3 kg)    Glycemic control:   Lab Results  Component Value Date   HGBA1C 8.4 09/07/2016   Lab Results  Component Value Date   CREATININE 0.57 06/23/2015   No results found for: MICRALBCREAT  No results found for: FRUCTOSAMINE    Allergies as of 09/07/2016      Reactions   Farxiga [dapagliflozin]    YEAST INFECTION   Januvia [sitagliptin]    GI upset   Lisinopril Cough   Sulfonamide Derivatives    REACTION: rash   Tanzeum [albiglutide]    50 mg causes vomiting   Trulicity [dulaglutide] Nausea Only      Medication List       Accurate as of 09/07/16  1:11 PM. Always use your most recent med list.          Albuterol Sulfate 108 (90 Base) MCG/ACT Aepb Inhale 2 puffs into the lungs as needed (as needed inhalation).   atorvastatin 80 MG tablet Commonly known as:  LIPITOR Take  80 mg by mouth daily.   budesonide-formoterol 160-4.5 MCG/ACT inhaler Commonly known as:  SYMBICORT Inhale 2 puffs into the lungs 2 (two) times daily.   Canagliflozin-Metformin HCl ER 50-500 MG Tb24 Commonly known as:  INVOKAMET XR Take 2 tablets by mouth daily with breakfast.   fluconazole 100 MG tablet Commonly known as:  DIFLUCAN Take 1 tablet (100 mg total) by mouth once.   FREESTYLE LIBRE READER Devi 1 Device by Does not apply route as directed.   [place] Misc Apply to upper arm and change sensor every 10 days   insulin regular human CONCENTRATED 500 UNIT/ML kwikpen Commonly known as:  HUMULIN R U-500 KWIKPEN 60 Units, 30 minutes before each meal   metoprolol tartrate 25 MG tablet Commonly known as:  LOPRESSOR Take 1 tablet (25 mg total) by mouth 2 (two) times daily.   metoprolol tartrate 25 MG tablet Commonly known as:  LOPRESSOR TAKE 1 TABLET BY MOUTH TWICE A DAY   nitroGLYCERIN 0.4 MG SL tablet Commonly known as:  NITROSTAT Place 1 tablet (0.4 mg total) under the tongue every 5 (five) minutes as needed for chest pain.   omeprazole 20 MG capsule Commonly known as:  PRILOSEC Take 20 mg by mouth 2 (two) times daily before a meal.   QNASL 80 MCG/ACT Aers Generic drug:  Beclomethasone Dipropionate Place 2 sprays into the nose daily.   TRESIBA FLEXTOUCH 100 UNIT/ML Sopn FlexTouch Pen Generic drug:  insulin degludec Inject 80 Units into the skin 2 (two) times daily.   valsartan 320 MG tablet Commonly known as:  DIOVAN Take 320 mg by mouth daily.   venlafaxine XR 37.5 MG 24 hr capsule Commonly known as:  EFFEXOR-XR Take 37.5 mg by mouth daily.   Vitamin D (Cholecalciferol) 1000 units Caps Take 1 capsule by mouth daily.       Allergies:  Allergies  Allergen Reactions  . Farxiga [Dapagliflozin]     YEAST INFECTION   . Januvia [Sitagliptin]     GI upset  . Lisinopril Cough  . Sulfonamide Derivatives     REACTION: rash  .  Tanzeum [Albiglutide]     50 mg causes vomiting  . Trulicity [Dulaglutide] Nausea Only    Past Medical History:  Diagnosis Date  . Asthma   . Depression   . Diabetes ([place])   . GERD (gastroesophageal reflux disease)   . Hyperlipidemia   . Hypertension   .  Kidney cysts   . Kidney stones   . Migraine headache     Past Surgical History:  Procedure Laterality Date  . none      Family History  Problem Relation Age of Onset  . Diabetes Mother   . Diabetes Maternal Grandmother   . Thyroid disease Cousin     Social History:  reports that she has never smoked. She has never used smokeless tobacco. She reports that she does not drink alcohol or use drugs.   Review of Systems  Constitutional: Positive for weight gain.  HENT: Negative for headaches.   Respiratory: Positive for daytime sleepiness.   Cardiovascular: Negative for leg swelling.  Gastrointestinal: Negative for constipation and abdominal pain.  Endocrine: Positive for fatigue and polydipsia.  Genitourinary: Positive for frequency.  Musculoskeletal: Negative for joint pain.  Skin: Negative for abnormal pigmentation.  Neurological: Negative for numbness and tingling.  Psychiatric/Behavioral: Positive for depressed mood.     Lipid history: Last LDL from PCP was 107   No results found for: CHOL, HDL, LDLCALC, LDLDIRECT, TRIG, CHOLHDL         Hypertension: Blood pressure has been high since her 21s, managed by PCP  Most recent eye exam was In 04/2016  Most recent foot exam: 8/18   Physical Examination:  BP 122/90   Pulse 90   Ht 4\' 11"  (1.499 m)   Wt 157 lb (71.2 kg)   SpO2 97%   BMI 31.71 kg/m   GENERAL:         Patient has generalized obesity.   HEENT:         Eye exam shows normal external appearance. Fundus exam shows no retinopathy. Oral exam shows normal mucosa .  NECK:   There is no lymphadenopathy Thyroid is not enlarged and no nodules felt.  Carotids are normal to palpation and no bruit  heard LUNGS:         Chest is symmetrical. Lungs are clear to auscultation.Marland Kitchen   HEART:         Heart sounds:  S1 and S2 are normal. No murmur or click heard., no S3 or S4.   ABDOMEN:   There is no distention present. Liver and spleen are not palpable. No other mass or tenderness present.   NEUROLOGICAL:   Ankle jerks are absent bilaterally.    Diabetic Foot Exam - Simple   Simple Foot Form Diabetic Foot exam was performed with the following findings:  Yes 09/07/2016 10:01 AM  Visual Inspection No deformities, no ulcerations, no other skin breakdown bilaterally:  Yes Sensation Testing Intact to touch and monofilament testing bilaterally:  Yes Pulse Check Posterior Tibialis and Dorsalis pulse intact bilaterally:  Yes Comments            Vibration sense is Minimally reduced in distal first toes. MUSCULOSKELETAL:  There is no swelling or deformity of the peripheral joints. Spine is normal to inspection.   EXTREMITIES:     There is no edema. No skin lesions present.Marland Kitchen SKIN:      no acanthosis of the next seen   No rash or lesions of concern.        ASSESSMENT:  Diabetes type 2, uncontrolled with BMI about 32 See history of present illness for detailed discussion of current diabetes management, blood sugar patterns and problems identified  A1c today is 8.4  She has been insulin-requiring since last year with significant amount of insulin resistance and poor control despite using large doses of basal insulin She  has had difficulties tolerating several medications including metformin 1000 mg, GLP-1 drugs, has had yeast infections with SGLT 2 drugs although not as much with the current one, a but she has not tried Invokana Currently not on mealtime insulin and also not monitoring POSTPRANDIAL readings Although her diet is reasonably good she is usually not getting a balanced meal in the morning at breakfast when she has this Not exercising currently and having difficulty losing weight, also  weight appears to be progressively higher since last year  Complications of diabetes: None evident  HYPERTENSION: Blood pressure relatively high today, to be followed by PCP  LDL elevation: Last level is over 100, followed by PCP  Fatigue and daytime somnolence of unclear etiology.  She does have continued depression issues  PLAN:    Start using freestyle Libre sensor for more convenience and monitoring especially when she is at work and also to allow her to look at what her blood sugars are doing with various meals  Start using HUMULIN R U-500 insulin  Explained to her that this is a concentrated insulin that is more effective and discussed onset of action and duration of action and need to take it 30 minutes before eating  She will start with 15 units at breakfast and to 60 units before lunch and supper  She was also given guidelines on adjusting the dose up or down 5 units if blood sugars are significantly out of range within 6 hours of the injection  Most likely she will need to still continue some basal insulin and she can try with 30 units Tresiba for now  Subsequently she may be a candidate for a week-go pump using the concentrated insulin would first need to establish her insulin requirement and also blood sugar patterns  She does need to have an insulin sensitizer and we will try her on metformin ER, this may be better tolerated if she uses INVOKAMET XR which has Glumetza formulation  For now she can start with the 50/500 one tablet daily of the Invokamet and then 2 tablets daily if tolerated  She was encouraged to have a protein with every meal especially breakfast  Needs review of day-to-day management, general education with nurse educator in short-term follow-up  She needs consistent exercise  Does need to follow-up with PCP to evaluate her symptoms of daytime somnolence  Today we will check TSH to rule out hypothyroidism  Patient Instructions  U-500 insulin take  30 min before meals:  Take 50 in am; 60 at lunch and supper  If the sugar is going significantly higher or getting low within 6 hours of the dose you may adjust that particular dose by 5 units  Take 30 units TRESIBA in the evenings, may do 5 units up if am sugar stays >140  Instead of Steglatro start taking Invokamet 1 tablet with breakfast daily for the first 5-7 days and then 2 tablets daily  Resume exercise as much as possible  At breakfast  make sure you have some protein like egg, high protein granola bar, dairy products or lean meat and not just cereal or bagel     Counseling time on subjects discussed in assessment and plan sections is over 50% of today's 60 minute visit   Consultation note has been sent to the referring physician  [facility] 09/07/2016, 1:11 PM   Note: This office note was prepared with Dragon voice recognition system technology. Any transcriptional errors that result from this process are unintentional.

## 2016-09-07 ENCOUNTER — Encounter: Payer: Self-pay | Admitting: Endocrinology

## 2016-09-07 ENCOUNTER — Ambulatory Visit (INDEPENDENT_AMBULATORY_CARE_PROVIDER_SITE_OTHER): Payer: BC Managed Care – PPO | Admitting: Endocrinology

## 2016-09-07 VITALS — BP 122/90 | HR 90 | Ht 59.0 in | Wt 157.0 lb

## 2016-09-07 DIAGNOSIS — Z794 Long term (current) use of insulin: Secondary | ICD-10-CM | POA: Diagnosis not present

## 2016-09-07 DIAGNOSIS — E1165 Type 2 diabetes mellitus with hyperglycemia: Secondary | ICD-10-CM | POA: Diagnosis not present

## 2016-09-07 DIAGNOSIS — E1169 Type 2 diabetes mellitus with other specified complication: Secondary | ICD-10-CM

## 2016-09-07 DIAGNOSIS — R5383 Other fatigue: Secondary | ICD-10-CM | POA: Diagnosis not present

## 2016-09-07 DIAGNOSIS — E669 Obesity, unspecified: Secondary | ICD-10-CM

## 2016-09-07 LAB — URINALYSIS, ROUTINE W REFLEX MICROSCOPIC
BILIRUBIN URINE: NEGATIVE
Hgb urine dipstick: NEGATIVE
KETONES UR: NEGATIVE
LEUKOCYTES UA: NEGATIVE
NITRITE: NEGATIVE
PH: 5.5 (ref 5.0–8.0)
SPECIFIC GRAVITY, URINE: 1.02 (ref 1.000–1.030)
TOTAL PROTEIN, URINE-UPE24: NEGATIVE
UROBILINOGEN UA: 0.2 (ref 0.0–1.0)

## 2016-09-07 LAB — MICROALBUMIN / CREATININE URINE RATIO
Creatinine,U: 89.2 mg/dL
Microalb Creat Ratio: 1.2 mg/g (ref 0.0–30.0)
Microalb, Ur: 1.1 mg/dL (ref 0.0–1.9)

## 2016-09-07 LAB — POCT GLYCOSYLATED HEMOGLOBIN (HGB A1C): Hemoglobin A1C: 8.4

## 2016-09-07 LAB — TSH: TSH: 1.04 u[IU]/mL (ref 0.35–4.50)

## 2016-09-07 MED ORDER — INSULIN REGULAR HUMAN (CONC) 500 UNIT/ML ~~LOC~~ SOPN: PEN_INJECTOR | SUBCUTANEOUS | 0 refills | Status: DC

## 2016-09-07 MED ORDER — FLUCONAZOLE 100 MG PO TABS: 100.0000 mg | ORAL_TABLET | Freq: Once | ORAL | 2 refills | Status: AC

## 2016-09-07 MED ORDER — CANAGLIFLOZIN-METFORMIN HCL ER 50-500 MG PO TB24: 2.0000 | ORAL_TABLET | Freq: Every day | ORAL | 2 refills | Status: DC

## 2016-09-07 MED ORDER — FREESTYLE LIBRE READER DEVI: 1.0000 | 0 refills | Status: DC

## 2016-09-07 MED ORDER — FREESTYLE LIBRE SENSOR SYSTEM MISC: 3 refills | Status: DC

## 2016-09-07 NOTE — Patient Instructions (Addendum)
U-500 insulin take 30 min before meals:  Take 50 in am; 60 at lunch and supper  If the sugar is going significantly higher or getting low within 6 hours of the dose you may adjust that particular dose by 5 units  Take 30 units TRESIBA in the evenings, may do 5 units up if am sugar stays >140  Instead of Steglatro start taking Invokamet 1 tablet with breakfast daily for the first 5-7 days and then 2 tablets daily  Resume exercise as much as possible  At breakfast  make sure you have some protein like egg, high protein granola bar, dairy products or lean meat and not just cereal or bagel

## 2016-09-11 ENCOUNTER — Encounter: Payer: BC Managed Care – PPO | Admitting: Nutrition

## 2016-09-13 ENCOUNTER — Telehealth: Payer: Self-pay | Admitting: Endocrinology

## 2016-09-13 NOTE — Telephone Encounter (Signed)
PA completed and placed on your desk to sign, Thanks!

## 2016-09-13 NOTE — Telephone Encounter (Signed)
Need PA done.  She had excessive yeast infections with Merleen Nicely

## 2016-09-13 NOTE — Telephone Encounter (Signed)
Pharmacy called in reference to insurance not coveringCanagliflozin-Metformin HCl ER (INVOKAMET XR) 50-500 MG TB24 . Pharmacy stated insurance will cover Xigduo XR or needs a PA for Invokamet XR. Please call pharmacy and advise.

## 2016-09-13 NOTE — Telephone Encounter (Signed)
Please advise on how to proceed.

## 2016-09-18 ENCOUNTER — Encounter: Payer: BC Managed Care – PPO | Attending: Endocrinology | Admitting: Nutrition

## 2016-09-18 DIAGNOSIS — IMO0002 Reserved for concepts with insufficient information to code with codable children: Secondary | ICD-10-CM

## 2016-09-18 DIAGNOSIS — Z794 Long term (current) use of insulin: Secondary | ICD-10-CM | POA: Diagnosis not present

## 2016-09-18 DIAGNOSIS — Z713 Dietary counseling and surveillance: Secondary | ICD-10-CM | POA: Diagnosis not present

## 2016-09-18 DIAGNOSIS — E1165 Type 2 diabetes mellitus with hyperglycemia: Secondary | ICD-10-CM | POA: Diagnosis not present

## 2016-09-19 NOTE — Progress Notes (Signed)
Pt. Is here to review blood sugars and diet.  She did not bring her meter, but says FBSs are usually less than 130--mostly 110-125. She is taking Tresiba 30u q AM. She is also taking R-U500 insulin 15 before breakfast and 60u before lunch and supper.  She does not always wait 30 min., after injecting insulin before eating.  Stressed the need for this.   She is eating cereal and milk for breakfast each day.  Discussed why this is not recommended, and gave other suggestions for this meal. Discussed the Libre sensor,and the differences between sensor readings and blood sugar readings.  She reported good understanding of this.  She was shown how to load/ insert the sensor, how to start sensor use, and how to scan for readings.  She reported good understanding of this and had no final questions.

## 2016-09-22 NOTE — Patient Instructions (Signed)
Wait 30-45 min. After giving R insulin, before eating Insert sensor as directed every 10 days. Call if questions.

## 2016-09-24 ENCOUNTER — Other Ambulatory Visit: Payer: Self-pay

## 2016-09-30 ENCOUNTER — Other Ambulatory Visit: Payer: Self-pay | Admitting: Endocrinology

## 2016-10-04 ENCOUNTER — Telehealth: Payer: Self-pay

## 2016-10-04 ENCOUNTER — Other Ambulatory Visit: Payer: Self-pay

## 2016-10-04 MED ORDER — EMPAGLIFLOZIN-METFORMIN HCL 5-500 MG PO TABS: 2.0000 | ORAL_TABLET | Freq: Every day | ORAL | 4 refills | Status: DC

## 2016-10-04 NOTE — Telephone Encounter (Signed)
I contacted the patient and advised per Dr. Dwyane Dee to start Synjardy XR 5/500 2 times daily. Patient advised to call back if she had any further questions.

## 2016-10-04 NOTE — Telephone Encounter (Signed)
Error

## 2016-10-10 ENCOUNTER — Ambulatory Visit: Payer: BC Managed Care – PPO | Admitting: Endocrinology

## 2016-11-16 ENCOUNTER — Other Ambulatory Visit (INDEPENDENT_AMBULATORY_CARE_PROVIDER_SITE_OTHER): Payer: BC Managed Care – PPO

## 2016-11-16 DIAGNOSIS — E1165 Type 2 diabetes mellitus with hyperglycemia: Secondary | ICD-10-CM

## 2016-11-16 DIAGNOSIS — Z794 Long term (current) use of insulin: Secondary | ICD-10-CM | POA: Diagnosis not present

## 2016-11-16 LAB — BASIC METABOLIC PANEL
BUN: 15 mg/dL (ref 6–23)
CO2: 26 mEq/L (ref 19–32)
CREATININE: 0.58 mg/dL (ref 0.40–1.20)
Calcium: 9.6 mg/dL (ref 8.4–10.5)
Chloride: 100 mEq/L (ref 96–112)
GFR: 118.1 mL/min (ref >60.00)
Glucose, Bld: 132 mg/dL — ABNORMAL HIGH (ref 70–99)
POTASSIUM: 4.1 meq/L (ref 3.5–5.1)
Sodium: 137 mEq/L (ref 135–145)

## 2016-11-17 LAB — FRUCTOSAMINE: Fructosamine: 216 umol/L (ref 0–285)

## 2016-11-21 ENCOUNTER — Encounter: Payer: Self-pay | Admitting: Endocrinology

## 2016-11-21 ENCOUNTER — Ambulatory Visit (INDEPENDENT_AMBULATORY_CARE_PROVIDER_SITE_OTHER): Payer: BC Managed Care – PPO | Admitting: Endocrinology

## 2016-11-21 VITALS — BP 132/82 | HR 94 | Ht 59.0 in | Wt 167.6 lb

## 2016-11-21 DIAGNOSIS — E1165 Type 2 diabetes mellitus with hyperglycemia: Secondary | ICD-10-CM

## 2016-11-21 DIAGNOSIS — Z794 Long term (current) use of insulin: Secondary | ICD-10-CM | POA: Diagnosis not present

## 2016-11-21 DIAGNOSIS — Z6833 Body mass index (BMI) 33.0-33.9, adult: Secondary | ICD-10-CM

## 2016-11-21 MED ORDER — EMPAGLIFLOZIN-METFORMIN HCL ER 25-1000 MG PO TB24: 1.0000 | ORAL_TABLET | Freq: Every day | ORAL | 1 refills | Status: DC

## 2016-11-21 MED ORDER — GABAPENTIN 300 MG PO CAPS: 300.0000 mg | ORAL_CAPSULE | Freq: Two times a day (BID) | ORAL | 3 refills | Status: DC

## 2016-11-21 NOTE — Progress Notes (Signed)
Patient ID: Amy Clarke, female   DOB: [birth year], 46 y.o.   MRN: [id number]           Reason for Appointment:  for Type 2 Diabetes  Referring physician: Mayra Neer   History of Present Illness:          Date of diagnosis of type 2 diabetes mellitus: 2003?         Background history:   Amy Clarke had gestational diabetes in 1992 and subsequently was on diet alone Amy Clarke thinks Amy Clarke was started on diabetes medication about 15 years ago and probably took metformin which Amy Clarke took until about a year ago and this was stopped because of diarrhea.  Records show that Amy Clarke was taking 1000 mg of regular metformin twice a day With Amy Clarke PCP Amy Clarke has been on numerous diabetes medications over the years including Byetta, Trulicity, Jardiance and Januvia which apparently all caused side effects, mostly nausea or yeast infections with Vania Rea and Farxiga Amy Clarke A1c has been mostly higher this year, last year has been as low as 7.2, in January Amy Clarke A1c was 8.4  Recent history:   INSULIN regimen is:  [place] U-200, 80 bid.  Humulin R U-500 60 bid  Non-insulin hypoglycemic drugs the patient is taking are: Synjardy 5/500 twice a day  Amy Clarke A1c was 8.4 in August Fructosamine is now 216  Current management, blood sugar patterns and problems identified:  Amy Clarke was started on Humulin R U-500 Amy Clarke blood sugars are much lower  However Amy Clarke did not follow-up in September as directed  Also has started using the freestyle Libre sensor but Amy Clarke did not bring Amy Clarke meter with Amy Clarke, also is having some problems with the sensor sticking on  Despite having frequent low blood sugars Amy Clarke did not call to report this  Also Amy Clarke meter has only a few readings in the mornings and lunchtime today  Amy Clarke gets periodic overnight low blood sugars including occasionally around 2-3 AM  Blood sugars are fairly good throughout the day otherwise although Amy Clarke is not checking them after lunch or before supper time  Overall highest blood sugars  are probably 2 hours after eating in the evening with usually not above target  Overall Amy Clarke has been feeling better since Amy Clarke last visit and Amy Clarke is trying to be more active  Amy Clarke was supposed to start on Invokamet XR but because of insurance preference Amy Clarke is on [place], currently does not have any diarrhea with this even though Amy Clarke had intolerance to metformin previously           Side effects from medications have been: Diarhea with regular metformin  Compliance with the medical regimen: Fairly good Hypoglycemia:   only if Amy Clarke is doing a lot of exercise  Glucose monitoring:  done  times a day         Glucometer: One Touch/freestyle Libre .      Blood Glucose readings by recall:  Mean values apply above for all meters except median for One Touch  PRE-MEAL Fasting Lunch Dinner Bedtime Overall  Glucose range: 64-108  86, 129  91  ?  150   Mean/median:        POST-MEAL PC Breakfast PC Lunch PC Dinner  Glucose range:  58-1 12  59    Mean/median:        Self-care: The diet that the patient has been following is: tries to limit high-fat foods and drinks with sugar .     Typical meal intake: Breakfast  is sometimes cereal/bagel/granola Bar.  Usually eating breakfast only if Amy Clarke is working  Amy Clarke usually yogurt, cheese, fruit and crackers.  Dinner chicken with vegetables.  Amy Clarke will have snacks with granola bar or popcorn                Dietician visit, most recent: Several years ago in class               Exercise: off and on, currently not doing much, tends to be more active while at work with walking   Weight history:  Wt Readings from Last 3 Encounters:  11/21/16 167 lb 9.6 oz (76 kg)  09/07/16 157 lb (71.2 kg)  03/23/16 150 lb 6.4 oz (68.2 kg)    Glycemic control:   Lab Results  Component Value Date   HGBA1C 8.4 09/07/2016   Lab Results  Component Value Date   MICROALBUR 1.1 09/07/2016   CREATININE 0.58 11/16/2016   Lab Results  Component Value Date   MICRALBCREAT  1.2 09/07/2016    Lab Results  Component Value Date   FRUCTOSAMINE 216 11/16/2016      Allergies as of 11/21/2016      Reactions   Farxiga [dapagliflozin]    YEAST INFECTION   Januvia [sitagliptin]    GI upset   Lisinopril Cough   Sulfonamide Derivatives    REACTION: rash   Tanzeum [albiglutide]    50 mg causes vomiting   Trulicity [dulaglutide] Nausea Only      Medication List       Accurate as of 11/21/16  4:05 PM. Always use your most recent med list.          Albuterol Sulfate 108 (90 Base) MCG/ACT Aepb Inhale 2 puffs into the lungs as needed (as needed inhalation).   atorvastatin 80 MG tablet Commonly known as:  LIPITOR Take 80 mg by mouth daily.   budesonide-formoterol 160-4.5 MCG/ACT inhaler Commonly known as:  SYMBICORT Inhale 2 puffs into the lungs 2 (two) times daily.   Canagliflozin-Metformin HCl ER 50-500 MG Tb24 Commonly known as:  INVOKAMET XR Take 2 tablets by mouth daily with breakfast.   Empagliflozin-Metformin HCl 5-500 MG Tabs Commonly known as:  SYNJARDY Take 2 tablets by mouth daily.   FREESTYLE LIBRE READER Devi 1 Device by Does not apply route as directed.   [place] Misc Apply to upper arm and change sensor every 10 days   HUMULIN R U-500 KWIKPEN 500 UNIT/ML kwikpen Generic drug:  insulin regular human CONCENTRATED INJECT 60 UNITS INTO THE SKIN 30 MINUTES BEFORE EACH MEAL   metoprolol tartrate 25 MG tablet Commonly known as:  LOPRESSOR TAKE 1 TABLET BY MOUTH TWICE A DAY   nitroGLYCERIN 0.4 MG SL tablet Commonly known as:  NITROSTAT Place 1 tablet (0.4 mg total) under the tongue every 5 (five) minutes as needed for chest pain.   omeprazole 20 MG capsule Commonly known as:  PRILOSEC Take 20 mg by mouth 2 (two) times daily before a meal.   QNASL 80 MCG/ACT Aers Generic drug:  Beclomethasone Dipropionate Place 2 sprays into the nose daily.   TRESIBA FLEXTOUCH 100 UNIT/ML Sopn FlexTouch Pen Generic  drug:  insulin degludec Inject 80 Units into the skin 2 (two) times daily.   valsartan 320 MG tablet Commonly known as:  DIOVAN Take 160 mg by mouth 2 (two) times daily.   venlafaxine XR 150 MG 24 hr capsule Commonly known as:  EFFEXOR-XR Take 150 mg by mouth daily with  breakfast.   Vitamin D (Cholecalciferol) 1000 units Caps Take 1 capsule by mouth daily.       Allergies:  Allergies  Allergen Reactions  . Farxiga [Dapagliflozin]     YEAST INFECTION   . Januvia [Sitagliptin]     GI upset  . Lisinopril Cough  . Sulfonamide Derivatives     REACTION: rash  . Tanzeum [Albiglutide]     50 mg causes vomiting  . Trulicity [Dulaglutide] Nausea Only    Past Medical History:  Diagnosis Date  . Asthma   . Depression   . Diabetes ([place])   . GERD (gastroesophageal reflux disease)   . Hyperlipidemia   . Hypertension   . Kidney cysts   . Kidney stones   . Migraine headache     Past Surgical History:  Procedure Laterality Date  . none      Family History  Problem Relation Age of Onset  . Diabetes Mother   . Diabetes Maternal Grandmother   . Thyroid disease Cousin     Social History:  reports that Amy Clarke has never smoked. Amy Clarke has never used smokeless tobacco. Amy Clarke reports that Amy Clarke does not drink alcohol or use drugs.   Review of Systems   Lipid history: Last LDL from PCP was 107   No results found for: CHOL, HDL, LDLCALC, LDLDIRECT, TRIG, CHOLHDL         Hypertension: Blood pressure has been high since Amy Clarke 61s, managed by PCP  Most recent eye exam was In 04/2016  Most recent foot exam: 8/18  Fatigue: TSH was normal  Lab Results  Component Value Date   TSH 1.04 09/07/2016      Physical Examination:  BP 132/82   Pulse 94   Ht 4\' 11"  (1.499 m)   Wt 167 lb 9.6 oz (76 kg)   SpO2 94%   BMI 33.85 kg/m    Tinel sign is negative bilaterally   ASSESSMENT:  Diabetes type 2, uncontrolled with BMI 33  See history of present illness for detailed  discussion of current diabetes management, blood sugar patterns and problems identified  A1c at baseline was 8.4 and now fructosamine is low normal indicating excellent control  Amy Clarke has been doing much better with adding U-500 insulin to Amy Clarke basal insulin Amy Clarke is responding with only 60 units twice a day However Amy Clarke is starting to hypoglycemic and may be also benefiting from adding metformin in the form of Synjardy However Amy Clarke did not follow-up after Amy Clarke initial visit in August to review Amy Clarke progress and make adjustments in Amy Clarke insulin which are needed Amy Clarke low blood sugars overnight maybe both combination of the Humulin R in the basal insulin But however more significantly low sugars are midday  Amy Clarke has gained weight but this is likely to be from Amy Clarke significant improvement in blood sugars   HYPERTENSION: Blood pressure is improved    PLAN:    Amy Clarke will try to switch to the 25/1000 formulation of the Synjardy XR to see if blood sugars will be at Amy Clarke and Amy Clarke weight gain limited, however if Amy Clarke has persistent yeast infection Amy Clarke will reduce the dose  Reduce insulin doses: Tresiba by 10 units twice a day and also can take 45 units of R in the morning and 55 at suppertime  Amy Clarke will try to be consistent with diet and    Will consider consultation with dietitian  Amy Clarke will need to bring Amy Clarke FreeStyle Libre sensor/meter for download  Discussed how to use the  Tegaderm on the skin and then apply the sensor to promote better adherence   discussed blood sugar targets both fasting and postprandial  Amy Clarke needs consistent exercise  Does need to follow-up with PCP to evaluate Amy Clarke symptoms of probable carpal tunnel syndrome bilaterally   Today we will check TSH to rule out hypothyroidism  There are no Patient Instructions on file for this visit.        Damyia Strider 11/21/2016, 4:05 PM   Note: This office note was prepared with Dragon voice recognition system technology. Any  transcriptional errors that result from this process are unintentional.

## 2016-11-21 NOTE — Patient Instructions (Addendum)
Tresiba 70 units 2x daily  U-500 insulin 45 in am and 55 at dinner

## 2016-12-28 ENCOUNTER — Other Ambulatory Visit: Payer: BC Managed Care – PPO

## 2017-01-02 ENCOUNTER — Ambulatory Visit: Payer: BC Managed Care – PPO | Admitting: Endocrinology

## 2017-02-08 ENCOUNTER — Other Ambulatory Visit (INDEPENDENT_AMBULATORY_CARE_PROVIDER_SITE_OTHER): Payer: BC Managed Care – PPO

## 2017-02-08 DIAGNOSIS — Z794 Long term (current) use of insulin: Secondary | ICD-10-CM | POA: Diagnosis not present

## 2017-02-08 DIAGNOSIS — E1165 Type 2 diabetes mellitus with hyperglycemia: Secondary | ICD-10-CM

## 2017-02-08 LAB — BASIC METABOLIC PANEL
BUN: 17 mg/dL (ref 6–23)
CALCIUM: 9.4 mg/dL (ref 8.4–10.5)
CHLORIDE: 101 meq/L (ref 96–112)
CO2: 29 mEq/L (ref 19–32)
CREATININE: 0.62 mg/dL (ref 0.40–1.20)
GFR: 109.25 mL/min (ref >60.00)
Glucose, Bld: 132 mg/dL — ABNORMAL HIGH (ref 70–99)
Potassium: 4.4 mEq/L (ref 3.5–5.1)
Sodium: 138 mEq/L (ref 135–145)

## 2017-02-08 LAB — HEMOGLOBIN A1C: HEMOGLOBIN A1C: 8 % — AB (ref 4.6–6.5)

## 2017-02-12 ENCOUNTER — Encounter: Payer: Self-pay | Admitting: Endocrinology

## 2017-02-12 ENCOUNTER — Ambulatory Visit: Payer: BC Managed Care – PPO | Admitting: Endocrinology

## 2017-02-12 VITALS — BP 122/84 | HR 86 | Ht 59.0 in | Wt 169.0 lb

## 2017-02-12 DIAGNOSIS — Z6833 Body mass index (BMI) 33.0-33.9, adult: Secondary | ICD-10-CM | POA: Diagnosis not present

## 2017-02-12 DIAGNOSIS — Z794 Long term (current) use of insulin: Secondary | ICD-10-CM

## 2017-02-12 DIAGNOSIS — E1165 Type 2 diabetes mellitus with hyperglycemia: Secondary | ICD-10-CM

## 2017-02-12 DIAGNOSIS — I10 Essential (primary) hypertension: Secondary | ICD-10-CM

## 2017-02-12 DIAGNOSIS — E782 Mixed hyperlipidemia: Secondary | ICD-10-CM | POA: Diagnosis not present

## 2017-02-12 NOTE — Progress Notes (Signed)
Patient ID: Amy Clarke, female   DOB: December 11, 1970, 47 y.o.   MRN: [id number]           Reason for Appointment:  Follow-up for Type 2 Diabetes  Referring physician: Mayra Neer   History of Present Illness:          Date of diagnosis of type 2 diabetes mellitus: 2003?         Background history:   She had gestational diabetes in 1992 and subsequently was on diet alone She thinks she was started on diabetes medication about 15 years ago and probably took metformin which she took until about a year ago and this was stopped because of diarrhea.  Records show that she was taking 1000 mg of regular metformin twice a day With her PCP she has been on numerous diabetes medications over the years including Byetta, Trulicity, Jardiance and Januvia which apparently all caused side effects, mostly nausea or yeast infections with Vania Rea and Farxiga Her A1c has been mostly higher this year, last year has been as low as 7.2, in January her A1c was 8.4  Recent history:   INSULIN regimen is:  [place] U-200, 80 bid.  Humulin R U-500 60 bid  Non-insulin hypoglycemic drugs the patient is taking are: Synjardy  twice a day  Her A1c is still high at 8%  Current management, blood sugar patterns and problems identified:  She was having fairly good blood sugar control on her last visit in October  However because of various factors including not being able to exercise blood sugars appear to be overall not as well controlled  Despite increasing her Jardiance to 25 mg in the last couple of months  Difficult to assess her blood sugars as she is checking blood sugar very infrequently  Has not used her continuous glucose monitoring because of her running out of the sensors and difficulty  keeping them on while working in December   Even with this she was not usually checking her sugars at bedtime when they appear to be the highest  She says she will take HUMULIN at lunchtime if she is eating  something with carbohydrate otherwise not  Also she has not done well in her diet December and has gained a couple of pounds  Highest blood sugar recently has been 222 at bedtime, has no other bedtime readings recently  Recent blood sugars are variable in the morning blood sugars are relatively better at lunchtime         Side effects from medications have been: Diarhea with regular metformin  Compliance with the medical regimen: Fairly good Hypoglycemia:   only if she is doing a lot of exercise  Glucose monitoring:  done  times a day         Glucometer: One Touch/freestyle Libre .       Blood Glucose readings by review her CGM from December showed from average blood sugar of 125 but no consistent pattern except bedtime average of 172   Self-care: The diet that the patient has been following is: tries to limit high-fat foods and drinks with sugar .     Typical meal intake: Breakfast is sometimes cereal/bagel/granola Bar.  Usually eating breakfast only if she is working  [employer] usually yogurt, cheese, fruit and crackers.  Dinner chicken with vegetables.  She will have snacks with granola bar or popcorn                Dietician visit, most recent: Several years  ago in class               Exercise: off and on, currently not doing any   Weight history:  Wt Readings from Last 3 Encounters:  02/12/17 169 lb (76.7 kg)  11/21/16 167 lb 9.6 oz (76 kg)  09/07/16 157 lb (71.2 kg)    Glycemic control:   Lab Results  Component Value Date   HGBA1C 8.0 (H) 02/08/2017   HGBA1C 8.4 09/07/2016   Lab Results  Component Value Date   MICROALBUR 1.1 09/07/2016   CREATININE 0.62 02/08/2017   Lab Results  Component Value Date   MICRALBCREAT 1.2 09/07/2016    Lab Results  Component Value Date   FRUCTOSAMINE 216 11/16/2016      Allergies as of 02/12/2017      Reactions   Farxiga [dapagliflozin]    YEAST INFECTION   Januvia [sitagliptin]    GI upset   Lisinopril Cough    Sulfonamide Derivatives    REACTION: rash   Tanzeum [albiglutide]    50 mg causes vomiting   Trulicity [dulaglutide] Nausea Only      Medication List        Accurate as of 02/12/17  2:10 PM. Always use your most recent med list.          Albuterol Sulfate 108 (90 Base) MCG/ACT Aepb Inhale 2 puffs into the lungs as needed (as needed inhalation).   atorvastatin 80 MG tablet Commonly known as:  LIPITOR Take 80 mg by mouth daily.   budesonide-formoterol 160-4.5 MCG/ACT inhaler Commonly known as:  SYMBICORT Inhale 2 puffs into the lungs 2 (two) times daily.   Empagliflozin-Metformin HCl ER 25-1000 MG Tb24 Commonly known as:  SYNJARDY XR Take 1 tablet by mouth daily.   FREESTYLE LIBRE READER Devi 1 Device by Does not apply route as directed.   [place] Misc Apply to upper arm and change sensor every 10 days   gabapentin 300 MG capsule Commonly known as:  NEURONTIN Take 1 capsule (300 mg total) by mouth 2 (two) times daily.   HUMULIN R U-500 KWIKPEN 500 UNIT/ML kwikpen Generic drug:  insulin regular human CONCENTRATED INJECT 60 UNITS INTO THE SKIN 30 MINUTES BEFORE EACH MEAL   metoprolol tartrate 25 MG tablet Commonly known as:  LOPRESSOR TAKE 1 TABLET BY MOUTH TWICE A DAY   nitroGLYCERIN 0.4 MG SL tablet Commonly known as:  NITROSTAT Place 1 tablet (0.4 mg total) under the tongue every 5 (five) minutes as needed for chest pain.   omeprazole 20 MG capsule Commonly known as:  PRILOSEC Take 20 mg by mouth 2 (two) times daily before a meal.   QNASL 80 MCG/ACT Aers Generic drug:  Beclomethasone Dipropionate Place 2 sprays into the nose daily.   TRESIBA FLEXTOUCH 100 UNIT/ML Sopn FlexTouch Pen Generic drug:  insulin degludec Inject 80 Units into the skin 2 (two) times daily.   valsartan 320 MG tablet Commonly known as:  DIOVAN Take 160 mg by mouth 2 (two) times daily.   venlafaxine XR 150 MG 24 hr capsule Commonly known as:   EFFEXOR-XR Take 150 mg by mouth daily with breakfast.   Vitamin D (Cholecalciferol) 1000 units Caps Take 1 capsule by mouth daily.       Allergies:  Allergies  Allergen Reactions  . Farxiga [Dapagliflozin]     YEAST INFECTION   . Januvia [Sitagliptin]     GI upset  . Lisinopril Cough  . Sulfonamide Derivatives  REACTION: rash  . Tanzeum [Albiglutide]     50 mg causes vomiting  . Trulicity [Dulaglutide] Nausea Only    Past Medical History:  Diagnosis Date  . Asthma   . Depression   . Diabetes ([place])   . GERD (gastroesophageal reflux disease)   . Hyperlipidemia   . Hypertension   . Kidney cysts   . Kidney stones   . Migraine headache     Past Surgical History:  Procedure Laterality Date  . none      Family History  Problem Relation Age of Onset  . Diabetes Mother   . Diabetes Maternal Grandmother   . Thyroid disease Cousin     Social History:  reports that  has never smoked. she has never used smokeless tobacco. She reports that she does not drink alcohol or use drugs.   Review of Systems   Lipid history: Last LDL from PCP was 107   No results found for: CHOL, HDL, LDLCALC, LDLDIRECT, TRIG, CHOLHDL         Hypertension: Blood pressure has been high since her 53s, managed by PCP  Most recent eye exam was In 04/2016  Most recent foot exam: 8/18  Gabapentin was prescribed previously for sharp pains and leg/foot pains and also her carpal tunnel syndrome She thinks it takes the pain level down at night but not much during the day She tends to feel sleepy with this also However she is having more problems with her feet hurting when she is walking on them and slightly better with using insults  She still complains of fatigue and sleepiness  Physical Examination:  BP 122/84   Pulse 86   Ht 4\' 11"  (1.499 m)   Wt 169 lb (76.7 kg)   SpO2 98%   BMI 34.13 kg/m       ASSESSMENT:  Diabetes type 2, uncontrolled with BMI 33  See history of  present illness for detailed discussion of current diabetes management, blood sugar patterns and problems identified  Her A1c is at percent even though her blood sugars are much better on her visit in October This is likely to be from inconsistent diet Also not exercising Has gained weight despite increasing her Jardiance As discussed above difficult to assess her blood sugar patterns as she has infrequent monitoring and not using her CGM consistently at all times and also No readings available for the last 5 weeks   HYPERTENSION: Blood pressure is mildly increased  Foot pain: Like to be from plantar fasciitis it is but also probably has neuropathy which is partly controlled    PLAN:    She will try to use the freestyle Libre sensor consistently including checking at bedtime  Discussed blood sugar targets both fasting and postprandial  Most likely may need to increase her suppertime dose by 5-10 units of the Humulin if her bedtime readings are consistently high  At the same time she may need to reduce her evening Tresiba by 5-10 units  Advised her not to change her Tyler Aas on a daily basis and only if she has a consistent pattern over 5 days in the morning  She will need to start exercising when able to  Consistent diet with balanced amounts of carbohydrate and protein  To discuss her foot pain with PCP  Patient Instructions  Tyler Aas same dose for 5 days based on am sugar  Exercise more      Counseling time on subjects discussed in assessment and plan sections is over  50% of today's 25 minute visit     Elayne Snare 02/12/2017, 2:10 PM   Note: This office note was prepared with Dragon voice recognition system technology. Any transcriptional errors that result from this process are unintentional.

## 2017-02-12 NOTE — Patient Instructions (Addendum)
Amy Clarke same dose for 5 days based on am sugar  Exercise more

## 2017-02-15 ENCOUNTER — Other Ambulatory Visit: Payer: Self-pay | Admitting: Endocrinology

## 2017-03-11 ENCOUNTER — Other Ambulatory Visit: Payer: Self-pay | Admitting: Gastroenterology

## 2017-03-11 DIAGNOSIS — R130 Aphagia: Secondary | ICD-10-CM

## 2017-03-13 ENCOUNTER — Other Ambulatory Visit: Payer: BC Managed Care – PPO

## 2017-03-22 ENCOUNTER — Ambulatory Visit
Admission: RE | Admit: 2017-03-22 | Discharge: 2017-03-22 | Disposition: A | Discharge status: Home / Self Care | Payer: BC Managed Care – PPO | Source: Ambulatory Visit | Attending: Gastroenterology | Admitting: Gastroenterology

## 2017-03-22 DIAGNOSIS — R130 Aphagia: Secondary | ICD-10-CM

## 2017-04-04 ENCOUNTER — Other Ambulatory Visit: Payer: BC Managed Care – PPO

## 2017-04-08 ENCOUNTER — Ambulatory Visit: Payer: BC Managed Care – PPO | Admitting: Endocrinology

## 2017-04-30 DIAGNOSIS — H6980 Other specified disorders of Eustachian tube, unspecified ear: Secondary | ICD-10-CM | POA: Diagnosis not present

## 2017-04-30 DIAGNOSIS — J0100 Acute maxillary sinusitis, unspecified: Secondary | ICD-10-CM | POA: Diagnosis not present

## 2017-05-09 DIAGNOSIS — J329 Chronic sinusitis, unspecified: Secondary | ICD-10-CM | POA: Diagnosis not present

## 2017-05-15 IMAGING — CR DG CHEST 2V
2 series · 2 of 2 positions shown · non-contrast
Comparison: None.

CLINICAL DATA: Cough with dyspnea on exertion congestion 1 month.
Initial fever.

EXAM:
CHEST  2 VIEW

[w chest pa]
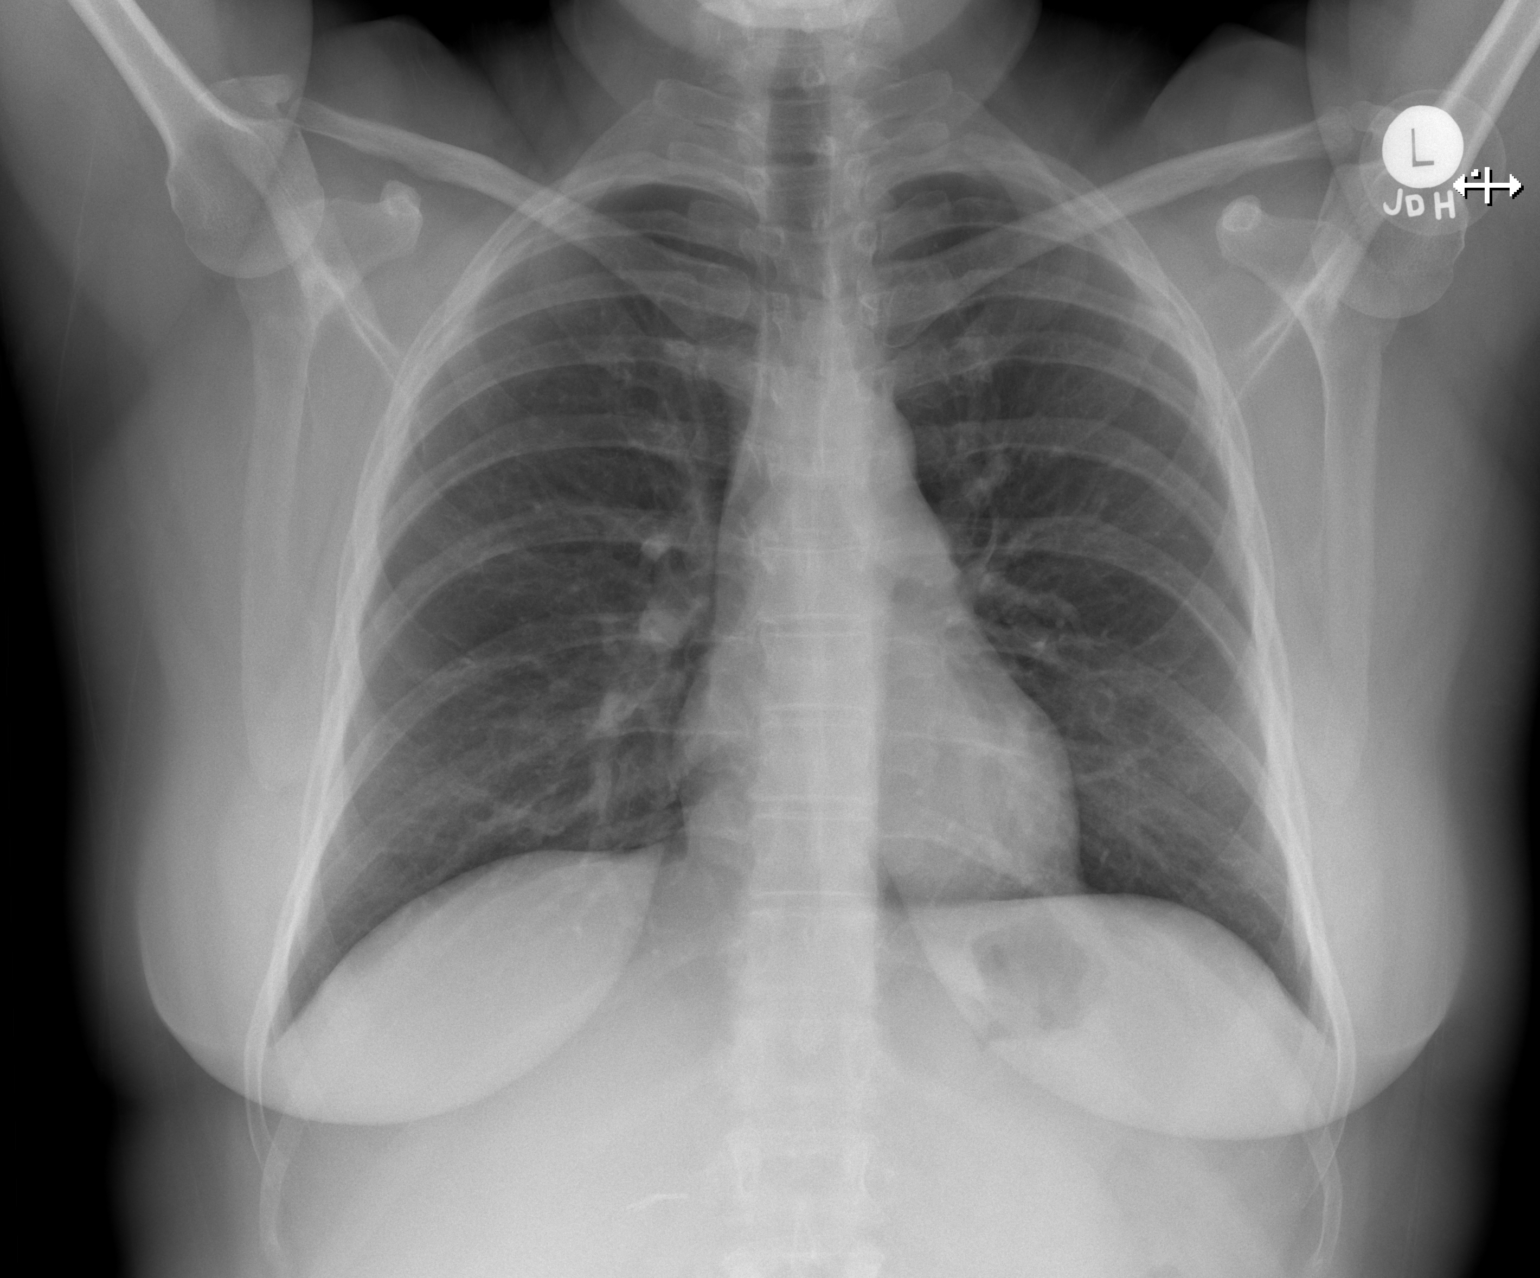

[w chest lat]
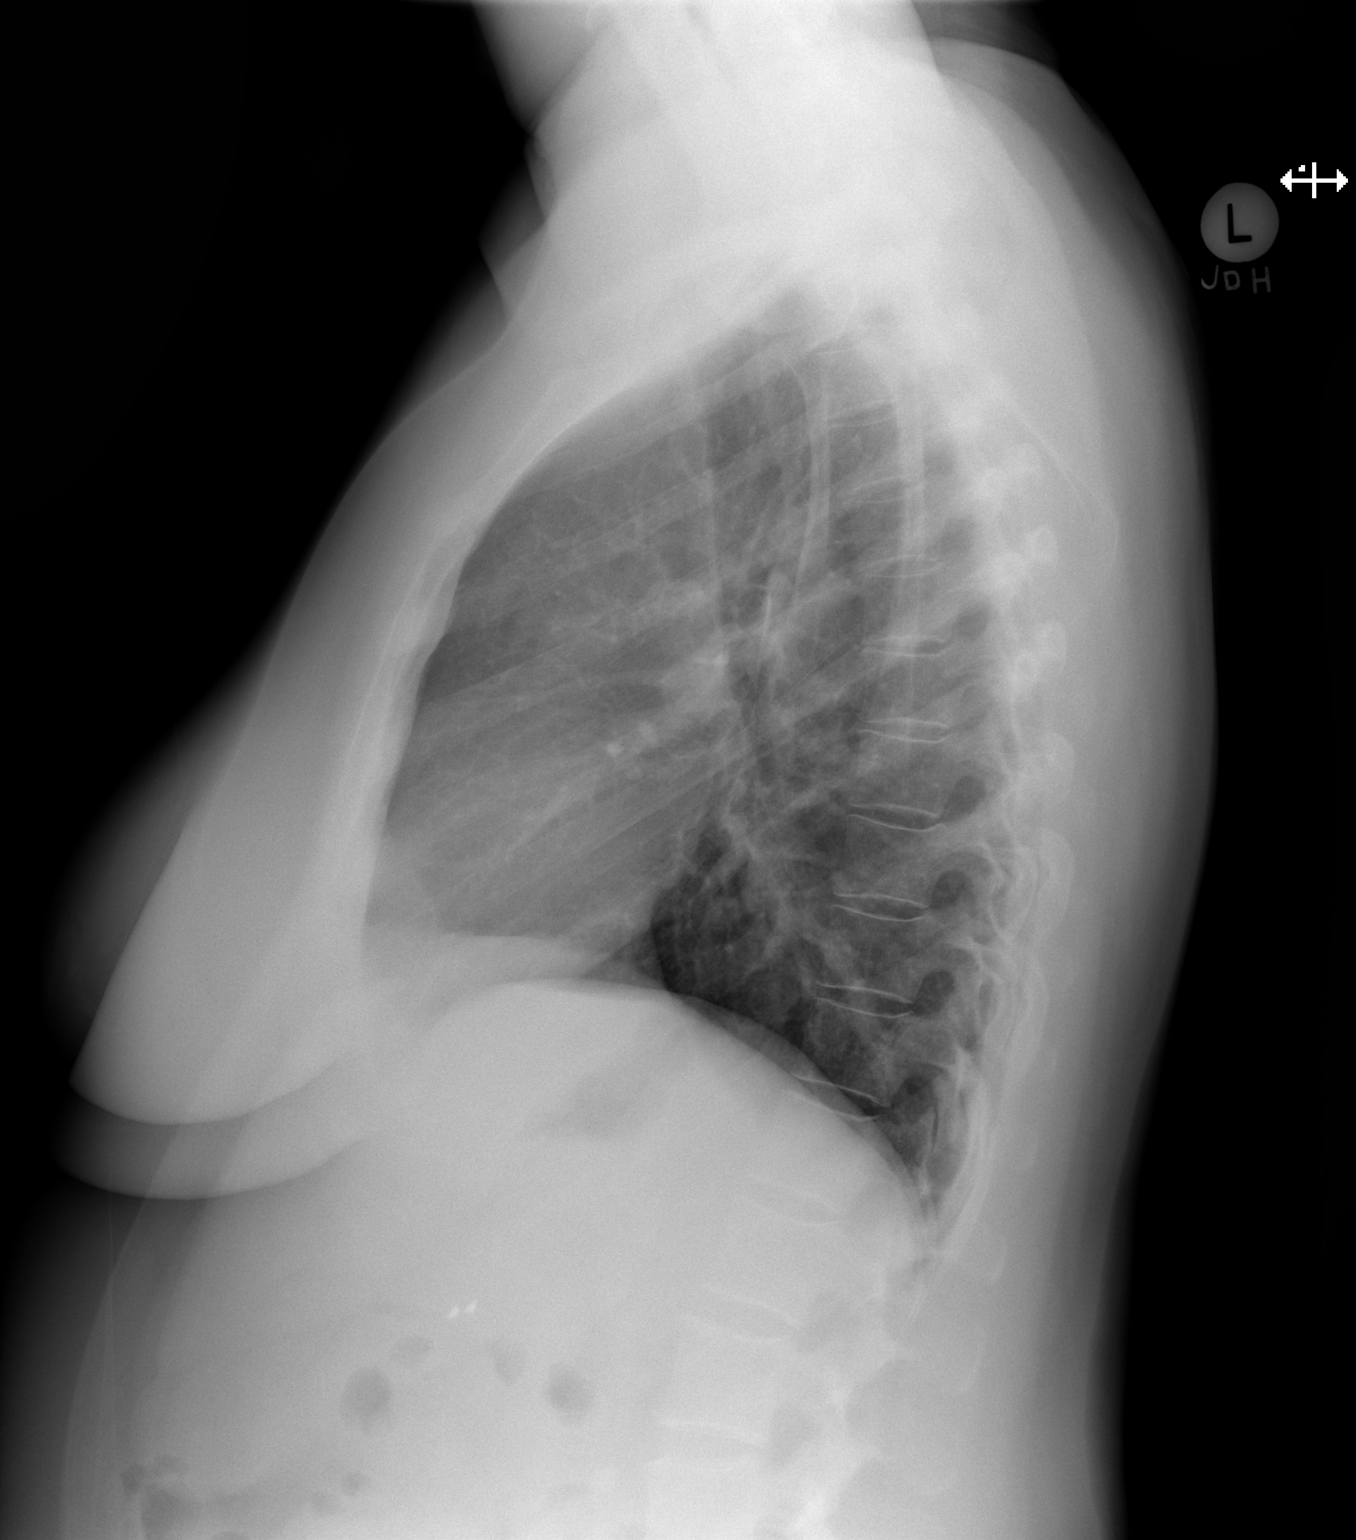

[2 of 2 positions shown; findings below may reference images not displayed]

FINDINGS: The heart size and mediastinal contours are within normal limits.
Both lungs are clear. The visualized skeletal structures are
unremarkable.
IMPRESSION: No active cardiopulmonary disease.

## 2017-05-29 DIAGNOSIS — E782 Mixed hyperlipidemia: Secondary | ICD-10-CM | POA: Diagnosis not present

## 2017-05-29 DIAGNOSIS — I129 Hypertensive chronic kidney disease with stage 1 through stage 4 chronic kidney disease, or unspecified chronic kidney disease: Secondary | ICD-10-CM | POA: Diagnosis not present

## 2017-05-29 DIAGNOSIS — H6980 Other specified disorders of Eustachian tube, unspecified ear: Secondary | ICD-10-CM | POA: Diagnosis not present

## 2017-05-29 DIAGNOSIS — N181 Chronic kidney disease, stage 1: Secondary | ICD-10-CM | POA: Diagnosis not present

## 2017-06-03 DIAGNOSIS — H6593 Unspecified nonsuppurative otitis media, bilateral: Secondary | ICD-10-CM | POA: Diagnosis not present

## 2017-06-03 DIAGNOSIS — H9313 Tinnitus, bilateral: Secondary | ICD-10-CM | POA: Diagnosis not present

## 2017-06-03 DIAGNOSIS — H9193 Unspecified hearing loss, bilateral: Secondary | ICD-10-CM | POA: Diagnosis not present

## 2017-06-03 DIAGNOSIS — H6983 Other specified disorders of Eustachian tube, bilateral: Secondary | ICD-10-CM | POA: Diagnosis not present

## 2017-06-03 DIAGNOSIS — M26623 Arthralgia of bilateral temporomandibular joint: Secondary | ICD-10-CM | POA: Diagnosis not present

## 2017-07-10 ENCOUNTER — Other Ambulatory Visit: Payer: Self-pay | Admitting: Endocrinology

## 2017-07-15 DIAGNOSIS — J343 Hypertrophy of nasal turbinates: Secondary | ICD-10-CM | POA: Diagnosis not present

## 2017-07-15 DIAGNOSIS — J310 Chronic rhinitis: Secondary | ICD-10-CM | POA: Diagnosis not present

## 2017-07-15 DIAGNOSIS — H6980 Other specified disorders of Eustachian tube, unspecified ear: Secondary | ICD-10-CM | POA: Diagnosis not present

## 2017-07-24 DIAGNOSIS — R109 Unspecified abdominal pain: Secondary | ICD-10-CM | POA: Diagnosis not present

## 2017-07-24 DIAGNOSIS — B356 Tinea cruris: Secondary | ICD-10-CM | POA: Diagnosis not present

## 2017-07-24 DIAGNOSIS — E1122 Type 2 diabetes mellitus with diabetic chronic kidney disease: Secondary | ICD-10-CM | POA: Diagnosis not present

## 2017-08-08 ENCOUNTER — Other Ambulatory Visit: Payer: Self-pay | Admitting: Endocrinology

## 2017-08-09 NOTE — Telephone Encounter (Signed)
Needs to schedule appointment for any refills

## 2017-08-09 NOTE — Telephone Encounter (Signed)
Refill or Deny? Pt has not been seen since 01/2017

## 2017-08-16 DIAGNOSIS — M26609 Unspecified temporomandibular joint disorder, unspecified side: Secondary | ICD-10-CM | POA: Insufficient documentation

## 2017-08-16 DIAGNOSIS — H6990 Unspecified Eustachian tube disorder, unspecified ear: Secondary | ICD-10-CM | POA: Diagnosis not present

## 2017-08-16 DIAGNOSIS — J343 Hypertrophy of nasal turbinates: Secondary | ICD-10-CM | POA: Diagnosis not present

## 2017-08-16 DIAGNOSIS — J342 Deviated nasal septum: Secondary | ICD-10-CM | POA: Diagnosis not present

## 2017-09-10 ENCOUNTER — Other Ambulatory Visit (INDEPENDENT_AMBULATORY_CARE_PROVIDER_SITE_OTHER): Payer: 59

## 2017-09-10 DIAGNOSIS — Z794 Long term (current) use of insulin: Secondary | ICD-10-CM

## 2017-09-10 DIAGNOSIS — E1165 Type 2 diabetes mellitus with hyperglycemia: Secondary | ICD-10-CM

## 2017-09-10 LAB — GLUCOSE, RANDOM: GLUCOSE: 193 mg/dL — AB (ref 70–99)

## 2017-09-10 LAB — LIPID PANEL
Cholesterol: 215 mg/dL — ABNORMAL HIGH (ref 0–200)
HDL: 34.1 mg/dL — ABNORMAL LOW (ref >39.00)
Total CHOL/HDL Ratio: 6
Triglycerides: 420 mg/dL — ABNORMAL HIGH (ref 0.0–149.0)

## 2017-09-10 LAB — LDL CHOLESTEROL, DIRECT: LDL DIRECT: 149 mg/dL

## 2017-09-11 ENCOUNTER — Encounter: Payer: Self-pay | Admitting: Endocrinology

## 2017-09-11 ENCOUNTER — Ambulatory Visit: Payer: 59 | Admitting: Endocrinology

## 2017-09-11 VITALS — BP 108/64 | HR 94 | Ht 59.0 in | Wt 167.4 lb

## 2017-09-11 DIAGNOSIS — E1165 Type 2 diabetes mellitus with hyperglycemia: Secondary | ICD-10-CM

## 2017-09-11 DIAGNOSIS — Z794 Long term (current) use of insulin: Secondary | ICD-10-CM

## 2017-09-11 DIAGNOSIS — E782 Mixed hyperlipidemia: Secondary | ICD-10-CM

## 2017-09-11 DIAGNOSIS — I10 Essential (primary) hypertension: Secondary | ICD-10-CM | POA: Diagnosis not present

## 2017-09-11 LAB — FRUCTOSAMINE: Fructosamine: 372 umol/L — ABNORMAL HIGH (ref 0–285)

## 2017-09-11 LAB — POCT GLYCOSYLATED HEMOGLOBIN (HGB A1C): Hemoglobin A1C: 10.3 % — AB (ref 4.0–5.6)

## 2017-09-11 MED ORDER — EZETIMIBE 10 MG PO TABS: 10.0000 mg | ORAL_TABLET | Freq: Every day | ORAL | 3 refills | Status: DC

## 2017-09-11 MED ORDER — EMPAGLIFLOZIN-METFORMIN HCL ER 25-1000 MG PO TB24: 1.0000 | ORAL_TABLET | Freq: Every day | ORAL | 1 refills | Status: DC

## 2017-09-11 MED ORDER — GLUCOSE BLOOD VI STRP: ORAL_STRIP | 3 refills | Status: AC

## 2017-09-11 MED ORDER — INSULIN DEGLUDEC 200 UNIT/ML ~~LOC~~ SOPN: 116.0000 [IU] | PEN_INJECTOR | Freq: Every day | SUBCUTANEOUS | 0 refills | Status: DC

## 2017-09-11 NOTE — Progress Notes (Signed)
Patient ID: Amy Clarke, female   DOB: [birth year], 47 y.o.   MRN: [id number]           Reason for Appointment:  Follow-up for Type 2 Diabetes  Referring physician: Mayra Neer   History of Present Illness:          Date of diagnosis of type 2 diabetes mellitus: 2003?         Background history:   She had gestational diabetes in 1992 and subsequently was on diet alone She thinks she was started on diabetes medication about 15 years ago and probably took metformin which she took until about a year ago and this was stopped because of diarrhea.  Records show that she was taking 1000 mg of regular metformin twice a day With her PCP she has been on numerous diabetes medications over the years including Byetta, Trulicity, Jardiance and Januvia which apparently all caused side effects, mostly nausea or yeast infections with Vania Rea and Farxiga Her A1c has been mostly higher this year, last year has been as low as 7.2, in January her A1c was 8.4  Recent history:   INSULIN regimen is:  [place] U-200, 100 bid.  Humulin R U-500, currently none  Non-insulin hypoglycemic drugs the patient is taking are: Iva Boop previously  Her A1c is much higher at 10.3 previously was also high at 8%  Current management, blood sugar patterns and problems identified:  She has not been seen in follow-up since 1/19  She has had problems with depression and has not been motivated to take care of her diabetes  Also has not taken her Humulin R insulin for several months  Previously taking 80 units of Tresiba twice daily and now taking 100 on her own  Also has not taken her Synjardy for some time  Currently not motivated to exercise but is starting to do a little walking recently  Has not used her continuous glucose monitoring because of  difficulty  keeping them on  Also has been doing more stress eating.  She had only 2 or 3 days where she was eating very little and her blood sugars were near  normal otherwise they are mostly over 200 and sporadically over 300         Side effects from medications have been: Diarhea with regular metformin  Compliance with the medical regimen: Fairly good Hypoglycemia:   only if she is doing a lot of exercise  Glucose monitoring:  done < 1 times a day         Glucometer: One Touch/freestyle Libre .       Blood Glucose readings by review her monitor download  MORNING blood sugar range 137-310 with median about 220  Lunch and dinnertime range 89-394 with average about 240  OVERALL MEDIAN 221   Self-care: The diet that the patient has been following is: tries to limit high-fat foods and drinks with sugar .     Typical meal intake: Breakfast is sometimes cereal/bagel/granola Bar.  Usually eating breakfast only if she is working  [employer] usually yogurt, cheese, fruit and crackers.  Dinner chicken with vegetables.  She will have snacks with granola bar or popcorn                Dietician visit, most recent: Several years ago in class               Exercise: walking a little recently  Weight history:  Wt Readings from Last 3 Encounters:  09/11/17  167 lb 6.4 oz (75.9 kg)  02/12/17 169 lb (76.7 kg)  11/21/16 167 lb 9.6 oz (76 kg)    Glycemic control:   Lab Results  Component Value Date   HGBA1C 10.3 (A) 09/11/2017   HGBA1C 8.0 (H) 02/08/2017   HGBA1C 8.4 09/07/2016   Lab Results  Component Value Date   MICROALBUR 1.1 09/07/2016   CREATININE 0.62 02/08/2017   Lab Results  Component Value Date   MICRALBCREAT 1.2 09/07/2016    Lab Results  Component Value Date   FRUCTOSAMINE 372 (H) 09/10/2017   FRUCTOSAMINE 216 11/16/2016      Allergies as of 09/11/2017      Reactions   Farxiga [dapagliflozin]    YEAST INFECTION   Januvia [sitagliptin]    GI upset   Lisinopril Cough   Sulfonamide Derivatives    REACTION: rash   Tanzeum [albiglutide]    50 mg causes vomiting   Trulicity [dulaglutide] Nausea Only      Medication  List        Accurate as of 09/11/17  8:48 PM. Always use your most recent med list.          Albuterol Sulfate 108 (90 Base) MCG/ACT Aepb Inhale 2 puffs into the lungs as needed (as needed inhalation).   budesonide-formoterol 160-4.5 MCG/ACT inhaler Commonly known as:  SYMBICORT Inhale 2 puffs into the lungs 2 (two) times daily.   buPROPion 150 MG 24 hr tablet Commonly known as:  WELLBUTRIN XL Take 150 mg by mouth daily.   Empagliflozin-metFORMIN HCl ER 25-1000 MG Tb24 Take 1 tablet by mouth daily.   ezetimibe 10 MG tablet Commonly known as:  ZETIA Take 1 tablet (10 mg total) by mouth daily.   glucose blood test strip Use as instructed to test 3 times daily   HUMULIN R U-500 KWIKPEN 500 UNIT/ML kwikpen Generic drug:  insulin regular human CONCENTRATED INJECT 60 UNITS INTO THE SKIN 30 MINUTES BEFORE EACH MEAL   Insulin Degludec 200 UNIT/ML Sopn Inject 116 Units into the skin daily.   metoprolol tartrate 25 MG tablet Commonly known as:  LOPRESSOR TAKE 1 TABLET BY MOUTH TWICE A DAY   nitroGLYCERIN 0.4 MG SL tablet Commonly known as:  NITROSTAT Place 1 tablet (0.4 mg total) under the tongue every 5 (five) minutes as needed for chest pain.   omeprazole 20 MG capsule Commonly known as:  PRILOSEC Take 20 mg by mouth 2 (two) times daily before a meal.   QNASL 80 MCG/ACT Aers Generic drug:  Beclomethasone Dipropionate Place 2 sprays into the nose daily.   rosuvastatin 20 MG tablet Commonly known as:  CRESTOR Take 20 mg by mouth daily.   valsartan 320 MG tablet Commonly known as:  DIOVAN Take 160 mg by mouth 2 (two) times daily.   venlafaxine XR 150 MG 24 hr capsule Commonly known as:  EFFEXOR-XR Take 150 mg by mouth daily with breakfast.   Vitamin D (Cholecalciferol) 1000 units Caps Take 1 capsule by mouth daily.       Allergies:  Allergies  Allergen Reactions  . Farxiga [Dapagliflozin]     YEAST INFECTION   . Januvia [Sitagliptin]     GI upset  .  Lisinopril Cough  . Sulfonamide Derivatives     REACTION: rash  . Tanzeum [Albiglutide]     50 mg causes vomiting  . Trulicity [Dulaglutide] Nausea Only    Past Medical History:  Diagnosis Date  . Asthma   . Depression   . Diabetes (  HCC)   . GERD (gastroesophageal reflux disease)   . Hyperlipidemia   . Hypertension   . Kidney cysts   . Kidney stones   . Migraine headache     Past Surgical History:  Procedure Laterality Date  . none      Family History  Problem Relation Age of Onset  . Diabetes Mother   . Diabetes Maternal Grandmother   . Thyroid disease Cousin     Social History:  reports that she has never smoked. She has never used smokeless tobacco. She reports that she does not drink alcohol or use drugs.   Review of Systems   Lipid history: Last LDL 149, previously better, has been on Crestor instead of Lipitor 80 mg since about May from PCP, no recent labs available from PCP, triglycerides also are high    Lab Results  Component Value Date   CHOL 215 (H) 09/10/2017   HDL 34.10 (L) 09/10/2017   LDLDIRECT 149.0 09/10/2017   TRIG (H) 09/10/2017    420.0 Triglyceride is over 400; calculations on Lipids are invalid.   CHOLHDL 6 09/10/2017           Hypertension: Blood pressure has been high since her 71s, managed by PCP with valsartan  Most recent eye exam was In 04/2016  Most recent foot exam: 8/18  She has been on bupropion for her depression for PCP   Physical Examination:  BP 108/64   Pulse 94   Ht 4\' 11"  (1.499 m)   Wt 167 lb 6.4 oz (75.9 kg)   SpO2 96%   BMI 33.81 kg/m       ASSESSMENT:  Diabetes type 2, uncontrolled with BMI 33  See history of present illness for detailed discussion of current diabetes management, blood sugar patterns and problems identified  Her A1c is much higher at 10.3  Because of her issues with depression she has not been watching her diet, taking her mealtime insulin or her Synjardy and has not checked her  sugars consistently also She was previously using the freestyle libre sensor but she thinks that she cannot keep this on even with wearing an arm band  Recently appears to be trying to be a little more motivated to take care of her diabetes management but still not exercising enough or watching diet consistently Even with taking 200 units of basal insulin a day her fasting readings are mostly over 200 recently   HYPERTENSION: Blood pressure is controlled    PLAN:    She will go back to taking Humulin R U-500 which had worked before  Discussed needing to take at least 60 units before meals and adjust this about 5 to 10 units based on the meal size and carbohydrate intake, may take about 40 units if eating just a low-calorie salad  Start walking regularly and increase to at least 4 times a week  Restart Synjardy at 25/1000 daily  May take Humulin 3 times a day but if eating only 2 meals a day she will skip the third dose  Switch Tresiba to the U-200 and she can take 160 units once a day  More regular follow-up  More consistent monitoring after meals  She will need to discuss day-to-day management with diabetes nurse educator in a couple of weeks to make sure she is making progress with better control and help with adjusting insulin  Would consider another trial of 14-day libre using her arm band  For her poorly controlled hyperlipidemia and LDL  of 147 she will add Zetia 10 mg daily to her Crestor  Continue follow-up with PCP for depression and other issues  Patient Instructions  Check blood sugars on waking up  4/7  Also check blood sugars about 2 hours after a meal and do this after different meals by rotation  Recommended blood sugar levels on waking up is 90-130 and about 2 hours after meal is 130-160  Please bring your blood sugar monitor to each visit, thank you  80 Tresiba am, pm      Counseling time on subjects discussed in assessment and plan sections is over  50% of today's 25 minute visit     Elayne Snare 09/11/2017, 8:48 PM   Note: This office note was prepared with Dragon voice recognition system technology. Any transcriptional errors that result from this process are unintentional.

## 2017-09-11 NOTE — Patient Instructions (Signed)
Check blood sugars on waking up  4/7  Also check blood sugars about 2 hours after a meal and do this after different meals by rotation  Recommended blood sugar levels on waking up is 90-130 and about 2 hours after meal is 130-160  Please bring your blood sugar monitor to each visit, thank you  80 Tresiba am, pm

## 2017-09-13 ENCOUNTER — Other Ambulatory Visit: Payer: Self-pay | Admitting: Endocrinology

## 2017-09-23 ENCOUNTER — Encounter: Payer: 59 | Attending: Endocrinology | Admitting: Nutrition

## 2017-09-23 DIAGNOSIS — Z794 Long term (current) use of insulin: Secondary | ICD-10-CM | POA: Insufficient documentation

## 2017-09-23 DIAGNOSIS — Z713 Dietary counseling and surveillance: Secondary | ICD-10-CM | POA: Insufficient documentation

## 2017-09-23 DIAGNOSIS — E1165 Type 2 diabetes mellitus with hyperglycemia: Secondary | ICD-10-CM | POA: Diagnosis not present

## 2017-09-23 DIAGNOSIS — E119 Type 2 diabetes mellitus without complications: Secondary | ICD-10-CM

## 2017-09-23 NOTE — Progress Notes (Signed)
Patient reports that she was severely depressed all of July, not taking her insulin, and sleeping 20 hours/day.  She did not bring her meter, but says she is taking the Tresiba 80 units every evening.  She did not test her blood sugar this AM.   She also reports that she has not  started on the U-500R insulin as yet.  We discussed the need for this, and the fact that she needs to take this 30 min. Before meals. She reported that she will go and pick this up from the pharmacy today. We also dicussed the need for balanced meals, watching portion sizes and trying to limit the fat in her meals.  We also discussed the need for exercise to loose weight.  I suggested working up to 30 min.  Daily.  She says she gets SOB when walking from her bedroom to the kitchen.  She was encouraged to see her general MD for this.  She denies SOB now. We also discussed the need to test blood sugars to determine the correct insulin dose--fasting tells [place] if the Tresiba dose is correct, acL: if the R acB is correct, etc.  She reported good understanding of this with no final questions.

## 2017-09-25 NOTE — Patient Instructions (Signed)
Start the R insulin today Test blood sugar before meals and at bedtime.

## 2017-09-26 LAB — SPECIMEN STATUS REPORT

## 2017-10-02 DIAGNOSIS — N181 Chronic kidney disease, stage 1: Secondary | ICD-10-CM | POA: Diagnosis not present

## 2017-10-02 DIAGNOSIS — Z23 Encounter for immunization: Secondary | ICD-10-CM | POA: Diagnosis not present

## 2017-10-02 DIAGNOSIS — I129 Hypertensive chronic kidney disease with stage 1 through stage 4 chronic kidney disease, or unspecified chronic kidney disease: Secondary | ICD-10-CM | POA: Diagnosis not present

## 2017-10-02 DIAGNOSIS — E1122 Type 2 diabetes mellitus with diabetic chronic kidney disease: Secondary | ICD-10-CM | POA: Diagnosis not present

## 2017-10-02 DIAGNOSIS — E782 Mixed hyperlipidemia: Secondary | ICD-10-CM | POA: Diagnosis not present

## 2017-10-04 LAB — BASIC METABOLIC PANEL
BUN/Creatinine Ratio: 17 (ref 9–23)
BUN: 11 mg/dL (ref 6–24)
CALCIUM: 9.6 mg/dL (ref 8.7–10.2)
CO2: 17 mmol/L — AB (ref 20–29)
CREATININE: 0.66 mg/dL (ref 0.57–1.00)
Chloride: 97 mmol/L (ref 96–106)
GFR calc Af Amer: 121 mL/min/{1.73_m2} (ref >59)
GFR calc non Af Amer: 105 mL/min/{1.73_m2} (ref >59)
Glucose: 184 mg/dL — ABNORMAL HIGH (ref 65–99)
Potassium: 4.2 mmol/L (ref 3.5–5.2)
Sodium: 138 mmol/L (ref 134–144)

## 2017-10-04 LAB — SPECIMEN STATUS REPORT

## 2017-10-15 ENCOUNTER — Other Ambulatory Visit: Payer: Self-pay | Admitting: Cardiovascular Disease

## 2017-10-28 ENCOUNTER — Observation Stay (HOSPITAL_COMMUNITY)
Admission: AD | Admit: 2017-10-28 | Discharge: 2017-10-29 | Disposition: A | Discharge status: Home / Self Care | Payer: 59 | Source: Ambulatory Visit | Attending: Internal Medicine | Admitting: Internal Medicine

## 2017-10-28 ENCOUNTER — Ambulatory Visit: Payer: 59 | Admitting: Nurse Practitioner

## 2017-10-28 ENCOUNTER — Other Ambulatory Visit (INDEPENDENT_AMBULATORY_CARE_PROVIDER_SITE_OTHER): Payer: 59

## 2017-10-28 ENCOUNTER — Encounter: Payer: Self-pay | Admitting: Nurse Practitioner

## 2017-10-28 VITALS — BP 130/90 | HR 92 | Ht 59.0 in | Wt 166.8 lb

## 2017-10-28 DIAGNOSIS — R0789 Other chest pain: Secondary | ICD-10-CM | POA: Diagnosis not present

## 2017-10-28 DIAGNOSIS — R079 Chest pain, unspecified: Secondary | ICD-10-CM

## 2017-10-28 DIAGNOSIS — Z79899 Other long term (current) drug therapy: Secondary | ICD-10-CM | POA: Insufficient documentation

## 2017-10-28 DIAGNOSIS — Z833 Family history of diabetes mellitus: Secondary | ICD-10-CM | POA: Insufficient documentation

## 2017-10-28 DIAGNOSIS — Z7951 Long term (current) use of inhaled steroids: Secondary | ICD-10-CM | POA: Insufficient documentation

## 2017-10-28 DIAGNOSIS — Z6833 Body mass index (BMI) 33.0-33.9, adult: Secondary | ICD-10-CM | POA: Diagnosis not present

## 2017-10-28 DIAGNOSIS — J45909 Unspecified asthma, uncomplicated: Secondary | ICD-10-CM | POA: Insufficient documentation

## 2017-10-28 DIAGNOSIS — E782 Mixed hyperlipidemia: Secondary | ICD-10-CM | POA: Insufficient documentation

## 2017-10-28 DIAGNOSIS — IMO0001 Reserved for inherently not codable concepts without codable children: Secondary | ICD-10-CM

## 2017-10-28 DIAGNOSIS — Z794 Long term (current) use of insulin: Secondary | ICD-10-CM | POA: Diagnosis not present

## 2017-10-28 DIAGNOSIS — F329 Major depressive disorder, single episode, unspecified: Secondary | ICD-10-CM | POA: Diagnosis not present

## 2017-10-28 DIAGNOSIS — Z888 Allergy status to other drugs, medicaments and biological substances status: Secondary | ICD-10-CM | POA: Insufficient documentation

## 2017-10-28 DIAGNOSIS — Z882 Allergy status to sulfonamides status: Secondary | ICD-10-CM | POA: Insufficient documentation

## 2017-10-28 DIAGNOSIS — E1165 Type 2 diabetes mellitus with hyperglycemia: Secondary | ICD-10-CM | POA: Insufficient documentation

## 2017-10-28 DIAGNOSIS — I10 Essential (primary) hypertension: Secondary | ICD-10-CM | POA: Diagnosis present

## 2017-10-28 DIAGNOSIS — I200 Unstable angina: Secondary | ICD-10-CM | POA: Diagnosis not present

## 2017-10-28 DIAGNOSIS — Z8249 Family history of ischemic heart disease and other diseases of the circulatory system: Secondary | ICD-10-CM | POA: Diagnosis not present

## 2017-10-28 DIAGNOSIS — E119 Type 2 diabetes mellitus without complications: Secondary | ICD-10-CM

## 2017-10-28 DIAGNOSIS — E669 Obesity, unspecified: Secondary | ICD-10-CM | POA: Diagnosis not present

## 2017-10-28 DIAGNOSIS — K219 Gastro-esophageal reflux disease without esophagitis: Secondary | ICD-10-CM | POA: Diagnosis not present

## 2017-10-28 DIAGNOSIS — Z0389 Encounter for observation for other suspected diseases and conditions ruled out: Secondary | ICD-10-CM

## 2017-10-28 DIAGNOSIS — Z87442 Personal history of urinary calculi: Secondary | ICD-10-CM | POA: Diagnosis not present

## 2017-10-28 LAB — COMPREHENSIVE METABOLIC PANEL
ALT: 29 U/L (ref 0–35)
ALT: 36 U/L (ref 0–44)
AST: 23 U/L (ref 0–37)
AST: 30 U/L (ref 15–41)
Albumin: 4.2 g/dL (ref 3.5–5.2)
Albumin: 4.2 g/dL (ref 3.5–5.0)
Alkaline Phosphatase: 66 U/L (ref 39–117)
Alkaline Phosphatase: 64 U/L (ref 38–126)
Anion gap: 10 (ref 5–15)
BUN: 26 mg/dL — AB (ref 6–23)
BUN: 19 mg/dL (ref 6–20)
CO2: 29 mEq/L (ref 19–32)
CO2: 24 mmol/L (ref 22–32)
Calcium: 9.6 mg/dL (ref 8.4–10.5)
Calcium: 9.3 mg/dL (ref 8.9–10.3)
Chloride: 102 mEq/L (ref 96–112)
Chloride: 103 mmol/L (ref 98–111)
Creatinine, Ser: 0.6 mg/dL (ref 0.40–1.20)
Creatinine, Ser: 0.58 mg/dL (ref 0.44–1.00)
GFR calc Af Amer: >60 mL/min (ref >60)
GFR calc non Af Amer: >60 mL/min (ref >60)
GFR: 113.12 mL/min (ref >60.00)
GLUCOSE: 71 mg/dL (ref 70–99)
Glucose, Bld: 81 mg/dL (ref 70–99)
POTASSIUM: 4 meq/L (ref 3.5–5.1)
Potassium: 3.6 mmol/L (ref 3.5–5.1)
SODIUM: 139 meq/L (ref 135–145)
Sodium: 137 mmol/L (ref 135–145)
TOTAL PROTEIN: 7.4 g/dL (ref 6.0–8.3)
Total Bilirubin: 0.5 mg/dL (ref 0.2–1.2)
Total Bilirubin: 0.5 mg/dL (ref 0.3–1.2)
Total Protein: 7.3 g/dL (ref 6.5–8.1)

## 2017-10-28 LAB — CBC WITH DIFFERENTIAL/PLATELET
Abs Immature Granulocytes: 0 10*3/uL (ref 0.0–0.1)
Basophils Absolute: 0 10*3/uL (ref 0.0–0.1)
Basophils Relative: 0 %
Eosinophils Absolute: 0.2 10*3/uL (ref 0.0–0.7)
Eosinophils Relative: 2 %
HCT: 44.4 % (ref 36.0–46.0)
Hemoglobin: 14.2 g/dL (ref 12.0–15.0)
Immature Granulocytes: 0 %
Lymphocytes Relative: 30 %
Lymphs Abs: 2.5 10*3/uL (ref 0.7–4.0)
MCH: 26.6 pg (ref 26.0–34.0)
MCHC: 32 g/dL (ref 30.0–36.0)
MCV: 83.3 fL (ref 78.0–100.0)
Monocytes Absolute: 0.8 10*3/uL (ref 0.1–1.0)
Monocytes Relative: 9 %
Neutro Abs: 4.9 10*3/uL (ref 1.7–7.7)
Neutrophils Relative %: 59 %
Platelets: 251 10*3/uL (ref 150–400)
RBC: 5.33 MIL/uL — ABNORMAL HIGH (ref 3.87–5.11)
RDW: 15.2 % (ref 11.5–15.5)
WBC: 8.3 10*3/uL (ref 4.0–10.5)

## 2017-10-28 LAB — TROPONIN I: Troponin I: <0.03 ng/mL (ref <0.03)

## 2017-10-28 LAB — BASIC METABOLIC PANEL
BUN: 26 mg/dL — ABNORMAL HIGH (ref 6–23)
CO2: 29 mEq/L (ref 19–32)
Calcium: 9.6 mg/dL (ref 8.4–10.5)
Chloride: 102 mEq/L (ref 96–112)
Creatinine, Ser: 0.6 mg/dL (ref 0.40–1.20)
GFR: 113.12 mL/min (ref >60.00)
Glucose, Bld: 71 mg/dL (ref 70–99)
POTASSIUM: 4 meq/L (ref 3.5–5.1)
SODIUM: 139 meq/L (ref 135–145)

## 2017-10-28 LAB — LIPID PANEL
CHOL/HDL RATIO: 7
Cholesterol: 215 mg/dL — ABNORMAL HIGH (ref 0–200)
HDL: 29.6 mg/dL — ABNORMAL LOW (ref >39.00)
NONHDL: 185.84
Triglycerides: 265 mg/dL — ABNORMAL HIGH (ref 0.0–149.0)
VLDL: 53 mg/dL — ABNORMAL HIGH (ref 0.0–40.0)

## 2017-10-28 LAB — APTT: aPTT: 31 seconds (ref 24–36)

## 2017-10-28 LAB — GLUCOSE, CAPILLARY
GLUCOSE-CAPILLARY: 85 mg/dL (ref 70–99)
GLUCOSE-CAPILLARY: 174 mg/dL — AB (ref 70–99)

## 2017-10-28 LAB — LDL CHOLESTEROL, DIRECT: Direct LDL: 154 mg/dL

## 2017-10-28 LAB — BRAIN NATRIURETIC PEPTIDE: B Natriuretic Peptide: 11.7 pg/mL (ref 0.0–100.0)

## 2017-10-28 LAB — PROTIME-INR
INR: 0.98
Prothrombin Time: 12.9 seconds (ref 11.4–15.2)

## 2017-10-28 LAB — TSH: TSH: 0.77 u[IU]/mL (ref 0.350–4.500)

## 2017-10-28 LAB — HEPARIN LEVEL (UNFRACTIONATED): Heparin Unfractionated: 0.27 IU/mL — ABNORMAL LOW (ref 0.30–0.70)

## 2017-10-28 MED ORDER — BUPROPION HCL ER (XL) 150 MG PO TB24: 150.0000 mg | ORAL_TABLET | Freq: Every day | ORAL | Status: DC

## 2017-10-28 MED ORDER — INSULIN DEGLUDEC 200 UNIT/ML ~~LOC~~ SOPN: 70.0000 [IU] | PEN_INJECTOR | Freq: Every day | SUBCUTANEOUS | Status: DC

## 2017-10-28 MED ORDER — SODIUM CHLORIDE 0.9% FLUSH: 3.0000 mL | INTRAVENOUS | Status: DC | PRN

## 2017-10-28 MED ORDER — PANTOPRAZOLE SODIUM 40 MG PO TBEC
40.0000 mg | DELAYED_RELEASE_TABLET | Freq: Every day | ORAL | Status: DC
  Administered 2017-10-29: 40 mg via ORAL
  Filled 2017-10-28: qty 1

## 2017-10-28 MED ORDER — HEPARIN (PORCINE) IN NACL 100-0.45 UNIT/ML-% IJ SOLN
900.0000 [IU]/h | INTRAMUSCULAR | Status: DC
  Administered 2017-10-28: 750 [IU]/h via INTRAVENOUS
  Filled 2017-10-28: qty 250

## 2017-10-28 MED ORDER — ASPIRIN 81 MG PO CHEW
324.0000 mg | CHEWABLE_TABLET | ORAL | Status: AC
  Administered 2017-10-28: 324 mg via ORAL
  Filled 2017-10-28: qty 4

## 2017-10-28 MED ORDER — NITROGLYCERIN 0.4 MG SL SUBL: 0.4000 mg | SUBLINGUAL_TABLET | SUBLINGUAL | Status: DC | PRN

## 2017-10-28 MED ORDER — ALBUTEROL SULFATE (2.5 MG/3ML) 0.083% IN NEBU: 3.0000 mL | INHALATION_SOLUTION | RESPIRATORY_TRACT | Status: DC | PRN

## 2017-10-28 MED ORDER — ASPIRIN EC 81 MG PO TBEC
81.0000 mg | DELAYED_RELEASE_TABLET | Freq: Every day | ORAL | Status: DC
  Administered 2017-10-29: 81 mg via ORAL
  Filled 2017-10-28: qty 1

## 2017-10-28 MED ORDER — METOPROLOL TARTRATE 25 MG PO TABS: 25.0000 mg | ORAL_TABLET | Freq: Two times a day (BID) | ORAL | Status: DC

## 2017-10-28 MED ORDER — SODIUM CHLORIDE 0.9 % IV SOLN: 250.0000 mL | INTRAVENOUS | Status: DC | PRN

## 2017-10-28 MED ORDER — SODIUM CHLORIDE 0.9 % IV SOLN
INTRAVENOUS | Status: DC
  Administered 2017-10-28: 10 mL/h via INTRAVENOUS

## 2017-10-28 MED ORDER — VITAMIN D (CHOLECALCIFEROL) 25 MCG (1000 UT) PO CAPS: 1.0000 | ORAL_CAPSULE | Freq: Every day | ORAL | Status: DC

## 2017-10-28 MED ORDER — ATORVASTATIN CALCIUM 80 MG PO TABS
80.0000 mg | ORAL_TABLET | Freq: Every day | ORAL | Status: DC
  Administered 2017-10-28: 80 mg via ORAL
  Filled 2017-10-28: qty 1

## 2017-10-28 MED ORDER — NITROGLYCERIN IN D5W 200-5 MCG/ML-% IV SOLN
0.0000 ug/min | INTRAVENOUS | Status: DC
  Administered 2017-10-28: 5 ug/min via INTRAVENOUS
  Filled 2017-10-28: qty 250

## 2017-10-28 MED ORDER — DIAZEPAM 5 MG PO TABS
10.0000 mg | ORAL_TABLET | ORAL | Status: AC
  Administered 2017-10-29: 10 mg via ORAL
  Filled 2017-10-28: qty 2

## 2017-10-28 MED ORDER — METOPROLOL TARTRATE 25 MG PO TABS
25.0000 mg | ORAL_TABLET | Freq: Two times a day (BID) | ORAL | Status: DC
  Administered 2017-10-28: 25 mg via ORAL
  Filled 2017-10-28: qty 1

## 2017-10-28 MED ORDER — INSULIN ASPART 100 UNIT/ML ~~LOC~~ SOLN: 0.0000 [IU] | Freq: Every day | SUBCUTANEOUS | Status: DC

## 2017-10-28 MED ORDER — FLUTICASONE FUROATE-VILANTEROL 200-25 MCG/INH IN AEPB
1.0000 | INHALATION_SPRAY | Freq: Every day | RESPIRATORY_TRACT | Status: DC
  Administered 2017-10-29: 1 via RESPIRATORY_TRACT
  Filled 2017-10-28: qty 28

## 2017-10-28 MED ORDER — ASPIRIN 300 MG RE SUPP: 300.0000 mg | RECTAL | Status: AC

## 2017-10-28 MED ORDER — EZETIMIBE 10 MG PO TABS
10.0000 mg | ORAL_TABLET | Freq: Every day | ORAL | Status: DC
  Administered 2017-10-29: 10 mg via ORAL
  Filled 2017-10-28: qty 1

## 2017-10-28 MED ORDER — ASPIRIN 81 MG PO CHEW
81.0000 mg | CHEWABLE_TABLET | ORAL | Status: AC
  Administered 2017-10-29: 81 mg via ORAL
  Filled 2017-10-28: qty 1

## 2017-10-28 MED ORDER — INSULIN ASPART 100 UNIT/ML ~~LOC~~ SOLN
0.0000 [IU] | Freq: Three times a day (TID) | SUBCUTANEOUS | Status: DC
  Administered 2017-10-29: 2 [IU] via SUBCUTANEOUS
  Administered 2017-10-29: 1 [IU] via SUBCUTANEOUS

## 2017-10-28 MED ORDER — ATORVASTATIN CALCIUM 80 MG PO TABS
80.0000 mg | ORAL_TABLET | Freq: Every day | ORAL | Status: DC
  Administered 2017-10-29: 80 mg via ORAL
  Filled 2017-10-28: qty 1

## 2017-10-28 MED ORDER — ONDANSETRON HCL 4 MG/2ML IJ SOLN: 4.0000 mg | Freq: Four times a day (QID) | INTRAMUSCULAR | Status: DC | PRN

## 2017-10-28 MED ORDER — CHOLECALCIFEROL 10 MCG (400 UNIT) PO TABS
400.0000 [IU] | ORAL_TABLET | Freq: Every day | ORAL | Status: DC
  Filled 2017-10-28: qty 1

## 2017-10-28 MED ORDER — IRBESARTAN 150 MG PO TABS
300.0000 mg | ORAL_TABLET | Freq: Every day | ORAL | Status: DC
  Administered 2017-10-29: 300 mg via ORAL
  Filled 2017-10-28: qty 2

## 2017-10-28 MED ORDER — SODIUM CHLORIDE 0.9% FLUSH
3.0000 mL | Freq: Two times a day (BID) | INTRAVENOUS | Status: DC
  Administered 2017-10-28: 3 mL via INTRAVENOUS

## 2017-10-28 MED ORDER — BUPROPION HCL ER (XL) 150 MG PO TB24
150.0000 mg | ORAL_TABLET | Freq: Every day | ORAL | Status: DC
  Administered 2017-10-29: 150 mg via ORAL
  Filled 2017-10-28 (×2): qty 1

## 2017-10-28 MED ORDER — VENLAFAXINE HCL ER 150 MG PO CP24
150.0000 mg | ORAL_CAPSULE | Freq: Every day | ORAL | Status: DC
  Administered 2017-10-29: 150 mg via ORAL
  Filled 2017-10-28 (×2): qty 1

## 2017-10-28 MED ORDER — HEPARIN BOLUS VIA INFUSION
4000.0000 [IU] | Freq: Once | INTRAVENOUS | Status: AC
  Administered 2017-10-28: 4000 [IU] via INTRAVENOUS
  Filled 2017-10-28: qty 4000

## 2017-10-28 MED ORDER — INSULIN GLARGINE 100 UNIT/ML ~~LOC~~ SOLN
70.0000 [IU] | Freq: Every day | SUBCUTANEOUS | Status: DC
  Filled 2017-10-28: qty 0.7

## 2017-10-28 MED ORDER — ACETAMINOPHEN 325 MG PO TABS: 650.0000 mg | ORAL_TABLET | ORAL | Status: DC | PRN

## 2017-10-28 NOTE — Patient Instructions (Addendum)
We are admitting you to the hospital today with plans for a cardiac catheterization.   Call the [place] office at [phone] if you have any questions, problems or concerns.

## 2017-10-28 NOTE — Addendum Note (Signed)
Addended by: Burtis Junes on: 10/28/2017 04:59 PM   Modules accepted: Orders, SmartSet

## 2017-10-28 NOTE — H&P (Addendum)
CARDIOLOGY HISTORY AND PHYSICAL  Date:  10/28/2017    [name] Date of Birth: 1970-05-23 Medical Record #[id number]  PCP:  Mayra Neer, MD   Cardiologist:  Johnsie Cancel        Chief Complaint  Patient presents with  . Chest Pain    Work in visit - seen for Dr. Johnsie Cancel    History of Present Illness: Amy Clarke is a 47 y.o. female who presents today for a work in visit. Seen for Dr. Johnsie Cancel.   She was seen here back in 2017 for tachycardia and cardiovascular risk factors which include DM, HTN and HLD. EKG with narrow complex tachycardia vs long RP tachycardia. She had had a viral component.  Echo with normal EF. Other issues include asthma. FH + for father having early CAD with an MI in his 8's.   She has not been seen here since February of 2018 - was felt to be doing ok at that time.   Comes in today. Here alone. Called for an appointment today.  Lots of symptoms. For the past month she has had a chest pressure sensation in her upper sternum. This is pretty constant. Worse with pain with minimal exertion. Gets better with rest.  Profound fatigue as well. She has had progressive DOE as well - to the point that she can't walk across the room. She is not able to exercise. 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 She did have some old NTG on hand - she has taken this twice - it did help some. She has lots of stress with her family - mother in a SNF and dying. Son has moved out that has mental illness. She has not been able to work due to "can't keep up". Has been trying to "take better care of myself". Her symptoms are progressive. She tried to get up in her husband's truck yesterday and "thought I would die".       Past Medical History:  Diagnosis Date  . Asthma   . Depression   . Diabetes ([place])   . GERD (gastroesophageal reflux disease)   . Hyperlipidemia   . Hypertension   . Kidney cysts   . Kidney  stones   . Migraine headache          Past Surgical History:  Procedure Laterality Date  . none       Medications: ActiveMedications      Current Meds  Medication Sig  . Albuterol Sulfate 108 (90 Base) MCG/ACT AEPB Inhale 2 puffs into the lungs as needed (as needed inhalation).  Marland Kitchen atorvastatin (LIPITOR) 80 MG tablet Take 80 mg by mouth daily.  . Beclomethasone Dipropionate (QNASL) 80 MCG/ACT AERS Place 2 sprays into the nose daily.  . budesonide-formoterol (SYMBICORT) 160-4.5 MCG/ACT inhaler Inhale 2 puffs into the lungs 2 (two) times daily.  [place] buPROPion (WELLBUTRIN XL) 150 MG 24 hr tablet Take 150 mg by mouth daily.  . Empagliflozin-metFORMIN HCl ER (SYNJARDY XR) 25-1000 MG TB24 Take 1 tablet by mouth daily.  Marland Kitchen ezetimibe (ZETIA) 10 MG tablet Take 1 tablet (10 mg total) by mouth daily.  . fluticasone (FLONASE) 50 MCG/ACT nasal spray SPRAY 2 SPRAYS INTO EACH NOSTRIL EVERY DAY  . glucose blood (ONE TOUCH ULTRA TEST) test strip Use as instructed to test 3 times daily  . HUMULIN R U-500 KWIKPEN 500 UNIT/ML kwikpen INJECT 60 UNITS INTO THE SKIN 30  MINUTES BEFORE EACH MEAL  . Insulin Degludec (TRESIBA FLEXTOUCH) 200 UNIT/ML SOPN Inject 116 Units into the skin daily.  . metoprolol tartrate (LOPRESSOR) 25 MG tablet Take 1 tablet (25 mg total) by mouth 2 (two) times daily. Please make overdue appt with Dr. Johnsie Cancel before anymore refills. 1st attempt  . nitroGLYCERIN (NITROSTAT) 0.4 MG SL tablet Place 1 tablet (0.4 mg total) under the tongue every 5 (five) minutes as needed for chest pain.  Marland Kitchen omeprazole (PRILOSEC) 20 MG capsule Take 20 mg by mouth 2 (two) times daily before a meal.   . valsartan (DIOVAN) 320 MG tablet Take 160 mg by mouth 2 (two) times daily.   Marland Kitchen venlafaxine XR (EFFEXOR-XR) 150 MG 24 hr capsule Take 150 mg by mouth daily with breakfast.  . Vitamin D, Cholecalciferol, 1000 units CAPS Take 1 capsule by mouth daily.       Allergies: Allergies  Allergen Reactions   . Farxiga [Dapagliflozin]     YEAST INFECTION   . Januvia [Sitagliptin]     GI upset  . Lisinopril Cough  . Sulfonamide Derivatives     REACTION: rash  . Tanzeum [Albiglutide]     50 mg causes vomiting  . Trulicity [Dulaglutide] Nausea Only    Social History: The patient  reports that she has never smoked. She has never used smokeless tobacco. She reports that she does not drink alcohol or use drugs.   Family History: The patient's family history includes Diabetes in her maternal grandmother and mother; Thyroid disease in her cousin. Father died at 58 from an MI. Her mother is 99 and in a nursing home due to dementia - she has had remote MI as well.   Review of Systems: Please see the history of present illness.   Otherwise, the review of systems is positive for none.   All other systems are reviewed and negative.   Physical Exam: VS:  BP 130/90 (BP Location: Left Arm, Patient Position: Sitting, Cuff Size: Normal)   Pulse 92   Ht 4\' 11"  (1.499 m)   Wt 166 lb 12.8 oz (75.7 kg)   BMI 33.69 kg/m  .  BMI Body mass index is 33.69 kg/m.     Wt Readings from Last 3 Encounters:  10/28/17 166 lb 12.8 oz (75.7 kg)  09/11/17 167 lb 6.4 oz (75.9 kg)  02/12/17 169 lb (76.7 kg)    General: Pleasant. She is short. She is overweight. Alert and in no acute distress but does endorse chest pressure - worse with going to the bathroom during the visit.   HEENT: Normal.  Neck: Supple, no JVD, carotid bruits, or masses noted.  Cardiac: Regular rate and rhythm. No murmurs, rubs, or gallops. No edema.  Respiratory:  Lungs are clear to auscultation bilaterally with normal work of breathing.  GI: Soft and nontender.  MS: No deformity or atrophy. Gait and ROM intact.  Skin: Warm and dry. Color is normal.  Neuro:  Strength and sensation are intact and no gross focal deficits noted.  Psych: Alert, appropriate and with normal affect.   LABORATORY DATA:  EKG:  EKG is  ordered today. This demonstrates NSR and is reviewed with Dr. Saunders Revel (DOD).  RecentLabs       Lab Results  Component Value Date   WBC 10.9 (H) 07/19/2009   HGB 13.1 07/19/2009   HCT 39.2 07/19/2009   PLT 219 07/19/2009   GLUCOSE 71 10/28/2017   GLUCOSE 71 10/28/2017   CHOL 215 (H) 10/28/2017  TRIG 265.0 (H) 10/28/2017   HDL 29.60 (L) 10/28/2017   LDLDIRECT 154.0 10/28/2017   ALT 29 10/28/2017   AST 23 10/28/2017   NA 139 10/28/2017   NA 139 10/28/2017   K 4.0 10/28/2017   K 4.0 10/28/2017   CL 102 10/28/2017   CL 102 10/28/2017   CREATININE 0.60 10/28/2017   CREATININE 0.60 10/28/2017   BUN 26 (H) 10/28/2017   BUN 26 (H) 10/28/2017   CO2 29 10/28/2017   CO2 29 10/28/2017   TSH 1.04 09/07/2016   HGBA1C 10.3 (A) 09/11/2017   MICROALBUR 1.1 09/07/2016       BNP (last 3 results) RecentLabs(withinlast365days)  No results for input(s): BNP in the last 8760 hours.    ProBNP (last 3 results) RecentLabs(withinlast365days)  No results for input(s): PROBNP in the last 8760 hours.     Other Studies Reviewed Today:  GXT Study Highlights 03/2016    Blood pressure demonstrated a normal response to exercise.  There was no ST segment deviation noted during stress.  Normal exercise treadmill stress test. Normal functional capacity. Normal BP response to exertion.     Echo Study Conclusions 06/2015  - Left ventricle: The cavity size was normal. Systolic function was normal. The estimated ejection fraction was in the range of 55% to 60%. Wall motion was normal; there were no regional wall motion abnormalities. Left ventricular diastolic function parameters were normal. - Atrial septum: No defect or patent foramen ovale was identified.  Assessment/Plan:  1. Chest pain -  Very worrisome for unstable angina. She has multiple risk factors which include uncontrolled DM, HTN, obesity, HLD and strongly +FH for  early CAD. She does not smoke. The patient was subsequently seen with Dr. Saunders Revel who agrees she needs to be admitted. Probable cath tomorrow. Will make NPO after MN tonight. Patient's husband has been called and he will transport to [facility]. Bed is available at this time.   2. History of tachycardia - not noted today.   3. DM - uncontrolled by A1C  4. HLD - has just changed to Lipitor  5. Obesity  6. Significant situational stress.     Current medicines are reviewed with the patient today.  The patient does not have concerns regarding medicines other than what has been noted above.  The following changes have been made:  See above.  Labs/ tests ordered today include:       Orders Placed This Encounter  Procedures  . EKG 12-Lead     Disposition:   Further disposition to follow.   Patient is agreeable to this plan and will call if any problems develop in the interim.   SignedTruitt Merle, NP  10/28/2017 3:53 PM  [place] [address] [place], [place]  [address] Phone: [phone] Fax: [phone]   Addendum: I have seen and examined the patient and agree with the findings and plan, as documented in the PA/NP's note, with the following additions/changes.  Patient is a 47 y/o woman with h/o tachycardia, HTN, HLD, DM, and strong family hx of premature ASCVD, presenting for evaluation of progressive chest pain over the last month.  She notes constant mild pressure that worsens with minimal activity such as climbing into a truck.  She notes significant chest tightness and shortness of breath walking from the exam room to the bathroom.  She denies a person h/o CAD but notes that several first degree relatives had MI's in their 63's.  Exam is notable for heart with RRR and no murmurs.  Lungs are clear.  No LE edema noted.  2+ radial pulse appreciated.  EKG personally reviewed and shows NSR without ischemic  changes.  Given worsening chest pressure over the last month, now present all the time and worsened with minimal activity, I have recommended direct admission to [facility] with medical optimization and likely cardiac catheterization tomorrow.  Procedure has been discussed with the patient, who is in agreement.  Nelva Bush, MD [facility] Pager: [phone]

## 2017-10-28 NOTE — H&P (View-Only) (Signed)
CARDIOLOGY HISTORY AND PHYSICAL  Date:  10/28/2017    [name] Date of Birth: 1970-07-26 Medical Record #[id number]  PCP:  Amy Neer, MD   Cardiologist:  Johnsie Cancel        Chief Complaint  Patient presents with  . Chest Pain    Work in visit - seen for Dr. Johnsie Cancel    History of Present Illness: Amy Clarke is a 47 y.o. female who presents today for a work in visit. Seen for Dr. Johnsie Cancel.   She was seen here back in 2017 for tachycardia and cardiovascular risk factors which include DM, HTN and HLD. EKG with narrow complex tachycardia vs long RP tachycardia. She had had a viral component.  Echo with normal EF. Other issues include asthma. FH + for father having early CAD with an MI in his 61's.   She has not been seen here since February of 2018 - was felt to be doing ok at that time.   Comes in today. Here alone. Called for an appointment today.  Lots of symptoms. For the past month she has had a chest pressure sensation in her upper sternum. This is pretty constant. Worse with pain with minimal exertion. Gets better with rest.  Profound fatigue as well. She has had progressive DOE as well - to the point that she can't walk across the room. She is not able to exercise. 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 She did have some old NTG on hand - she has taken this twice - it did help some. She has lots of stress with her family - mother in a SNF and dying. Son has moved out that has mental illness. She has not been able to work due to "can't keep up". Has been trying to "take better care of myself". Her symptoms are progressive. She tried to get up in her husband's truck yesterday and "thought I would die".       Past Medical History:  Diagnosis Date  . Asthma   . Depression   . Diabetes ([place])   . GERD (gastroesophageal reflux disease)   . Hyperlipidemia   . Hypertension   . Kidney cysts   . Kidney  stones   . Migraine headache          Past Surgical History:  Procedure Laterality Date  . none       Medications: ActiveMedications      Current Meds  Medication Sig  . Albuterol Sulfate 108 (90 Base) MCG/ACT AEPB Inhale 2 puffs into the lungs as needed (as needed inhalation).  Marland Kitchen atorvastatin (LIPITOR) 80 MG tablet Take 80 mg by mouth daily.  . Beclomethasone Dipropionate (QNASL) 80 MCG/ACT AERS Place 2 sprays into the nose daily.  . budesonide-formoterol (SYMBICORT) 160-4.5 MCG/ACT inhaler Inhale 2 puffs into the lungs 2 (two) times daily.  Marland Kitchen buPROPion (WELLBUTRIN XL) 150 MG 24 hr tablet Take 150 mg by mouth daily.  . Empagliflozin-metFORMIN HCl ER (SYNJARDY XR) 25-1000 MG TB24 Take 1 tablet by mouth daily.  Marland Kitchen ezetimibe (ZETIA) 10 MG tablet Take 1 tablet (10 mg total) by mouth daily.  . fluticasone (FLONASE) 50 MCG/ACT nasal spray SPRAY 2 SPRAYS INTO EACH NOSTRIL EVERY DAY  . glucose blood (ONE TOUCH ULTRA TEST) test strip Use as instructed to test 3 times daily  . HUMULIN R U-500 KWIKPEN 500 UNIT/ML kwikpen INJECT 60 UNITS INTO THE SKIN 30  MINUTES BEFORE EACH MEAL  . Insulin Degludec (TRESIBA FLEXTOUCH) 200 UNIT/ML SOPN Inject 116 Units into the skin daily.  . metoprolol tartrate (LOPRESSOR) 25 MG tablet Take 1 tablet (25 mg total) by mouth 2 (two) times daily. Please make overdue appt with Dr. Johnsie Cancel before anymore refills. 1st attempt  . nitroGLYCERIN (NITROSTAT) 0.4 MG SL tablet Place 1 tablet (0.4 mg total) under the tongue every 5 (five) minutes as needed for chest pain.  Marland Kitchen omeprazole (PRILOSEC) 20 MG capsule Take 20 mg by mouth 2 (two) times daily before a meal.   . valsartan (DIOVAN) 320 MG tablet Take 160 mg by mouth 2 (two) times daily.   Marland Kitchen venlafaxine XR (EFFEXOR-XR) 150 MG 24 hr capsule Take 150 mg by mouth daily with breakfast.  . Vitamin D, Cholecalciferol, 1000 units CAPS Take 1 capsule by mouth daily.       Allergies: Allergies  Allergen Reactions   . Farxiga [Dapagliflozin]     YEAST INFECTION   . Januvia [Sitagliptin]     GI upset  . Lisinopril Cough  . Sulfonamide Derivatives     REACTION: rash  . Tanzeum [Albiglutide]     50 mg causes vomiting  . Trulicity [Dulaglutide] Nausea Only    Social History: The patient  reports that she has never smoked. She has never used smokeless tobacco. She reports that she does not drink alcohol or use drugs.   Family History: The patient's family history includes Diabetes in her maternal grandmother and mother; Thyroid disease in her cousin. Father died at 48 from an MI. Her mother is 36 and in a nursing home due to dementia - she has had remote MI as well.   Review of Systems: Please see the history of present illness.   Otherwise, the review of systems is positive for none.   All other systems are reviewed and negative.   Physical Exam: VS:  BP 130/90 (BP Location: Left Arm, Patient Position: Sitting, Cuff Size: Normal)   Pulse 92   Ht 4\' 11"  (1.499 m)   Wt 166 lb 12.8 oz (75.7 kg)   BMI 33.69 kg/m  .  BMI Body mass index is 33.69 kg/m.     Wt Readings from Last 3 Encounters:  10/28/17 166 lb 12.8 oz (75.7 kg)  09/11/17 167 lb 6.4 oz (75.9 kg)  02/12/17 169 lb (76.7 kg)    General: Pleasant. She is short. She is overweight. Alert and in no acute distress but does endorse chest pressure - worse with going to the bathroom during the visit.   HEENT: Normal.  Neck: Supple, no JVD, carotid bruits, or masses noted.  Cardiac: Regular rate and rhythm. No murmurs, rubs, or gallops. No edema.  Respiratory:  Lungs are clear to auscultation bilaterally with normal work of breathing.  GI: Soft and nontender.  MS: No deformity or atrophy. Gait and ROM intact.  Skin: Warm and dry. Color is normal.  Neuro:  Strength and sensation are intact and no gross focal deficits noted.  Psych: Alert, appropriate and with normal affect.   LABORATORY DATA:  EKG:  EKG is  ordered today. This demonstrates NSR and is reviewed with Dr. Saunders Revel (DOD).  RecentLabs       Lab Results  Component Value Date   WBC 10.9 (H) 07/19/2009   HGB 13.1 07/19/2009   HCT 39.2 07/19/2009   PLT 219 07/19/2009   GLUCOSE 71 10/28/2017   GLUCOSE 71 10/28/2017   CHOL 215 (H) 10/28/2017  TRIG 265.0 (H) 10/28/2017   HDL 29.60 (L) 10/28/2017   LDLDIRECT 154.0 10/28/2017   ALT 29 10/28/2017   AST 23 10/28/2017   NA 139 10/28/2017   NA 139 10/28/2017   K 4.0 10/28/2017   K 4.0 10/28/2017   CL 102 10/28/2017   CL 102 10/28/2017   CREATININE 0.60 10/28/2017   CREATININE 0.60 10/28/2017   BUN 26 (H) 10/28/2017   BUN 26 (H) 10/28/2017   CO2 29 10/28/2017   CO2 29 10/28/2017   TSH 1.04 09/07/2016   HGBA1C 10.3 (A) 09/11/2017   MICROALBUR 1.1 09/07/2016       BNP (last 3 results) RecentLabs(withinlast365days)  No results for input(s): BNP in the last 8760 hours.    ProBNP (last 3 results) RecentLabs(withinlast365days)  No results for input(s): PROBNP in the last 8760 hours.     Other Studies Reviewed Today:  GXT Study Highlights 03/2016    Blood pressure demonstrated a normal response to exercise.  There was no ST segment deviation noted during stress.  Normal exercise treadmill stress test. Normal functional capacity. Normal BP response to exertion.     Echo Study Conclusions 06/2015  - Left ventricle: The cavity size was normal. Systolic function was normal. The estimated ejection fraction was in the range of 55% to 60%. Wall motion was normal; there were no regional wall motion abnormalities. Left ventricular diastolic function parameters were normal. - Atrial septum: No defect or patent foramen ovale was identified.  Assessment/Plan:  1. Chest pain -  Very worrisome for unstable angina. She has multiple risk factors which include uncontrolled DM, HTN, obesity, HLD and strongly +FH for  early CAD. She does not smoke. The patient was subsequently seen with Dr. Saunders Revel who agrees she needs to be admitted. Probable cath tomorrow. Will make NPO after MN tonight. Patient's husband has been called and he will transport to [facility]. Bed is available at this time.   2. History of tachycardia - not noted today.   3. DM - uncontrolled by A1C  4. HLD - has just changed to Lipitor  5. Obesity  6. Significant situational stress.     Current medicines are reviewed with the patient today.  The patient does not have concerns regarding medicines other than what has been noted above.  The following changes have been made:  See above.  Labs/ tests ordered today include:       Orders Placed This Encounter  Procedures  . EKG 12-Lead     Disposition:   Further disposition to follow.   Patient is agreeable to this plan and will call if any problems develop in the interim.   SignedTruitt Merle, NP  10/28/2017 3:53 PM  [place] [address] [place], [place]  [address] Phone: [phone] Fax: [phone]   Addendum: I have seen and examined the patient and agree with the findings and plan, as documented in the PA/NP's note, with the following additions/changes.  Patient is a 47 y/o woman with h/o tachycardia, HTN, HLD, DM, and strong family hx of premature ASCVD, presenting for evaluation of progressive chest pain over the last month.  She notes constant mild pressure that worsens with minimal activity such as climbing into a truck.  She notes significant chest tightness and shortness of breath walking from the exam room to the bathroom.  She denies a person h/o CAD but notes that several first degree relatives had MI's in their 60's.  Exam is notable for heart with RRR and no murmurs.  Lungs are clear.  No LE edema noted.  2+ radial pulse appreciated.  EKG personally reviewed and shows NSR without ischemic  changes.  Given worsening chest pressure over the last month, now present all the time and worsened with minimal activity, I have recommended direct admission to [facility] with medical optimization and likely cardiac catheterization tomorrow.  Procedure has been discussed with the patient, who is in agreement.  Nelva Bush, MD [facility] Pager: [phone]

## 2017-10-28 NOTE — Progress Notes (Signed)
ANTICOAGULATION CONSULT NOTE - Initial Consult  Pharmacy Consult for heparin Indication: chest pain/ACS  Allergies  Allergen Reactions  . Farxiga [Dapagliflozin]     YEAST INFECTION   . Januvia [Sitagliptin]     GI upset  . Lisinopril Cough  . Sulfonamide Derivatives     REACTION: rash  . Tanzeum [Albiglutide]     50 mg causes vomiting  . Trulicity [Dulaglutide] Nausea Only    Patient Measurements:   Heparin Dosing Weight: 60kg  Vital Signs: BP: 130/90 (09/30 1519) Pulse Rate: 92 (09/30 1519)  Labs: Recent Labs    10/28/17 1021  CREATININE 0.60  0.60    Estimated Creatinine Clearance: 76.3 mL/min (by C-G formula based on SCr of 0.6 mg/dL).   Medical History: Past Medical History:  Diagnosis Date  . Asthma   . Depression   . Diabetes ([place])   . GERD (gastroesophageal reflux disease)   . Hyperlipidemia   . Hypertension   . Kidney cysts   . Kidney stones   . Migraine headache     Assessment: 63 yoF admitted with CP and possible ACS. Pharmacy consulted for IV heparin. No OAC noted PTA, planning for cardiac cath at some point tomorrow 10/1.  Goal of Therapy:  Heparin level 0.3-0.7 units/ml Monitor platelets by anticoagulation protocol: Yes   Plan:  -Heparin 4000 units x1 -Heparin 750 units/hr -Check 6-hr heparin level -Monitor heparin level, CBC, S/Sx bleeding daily  Arrie Senate, PharmD, BCPS Clinical Pharmacist [phone] Please check AMION for all [facility] numbers 10/28/2017

## 2017-10-28 NOTE — Progress Notes (Signed)
CARDIOLOGY OFFICE NOTE  Date:  10/28/2017    [name] Date of Birth: [birth year] Medical Record #[id number]  PCP:  Mayra Neer, MD  Cardiologist:  Johnsie Cancel    Chief Complaint  Patient presents with  . Chest Pain    Work in visit - seen for Dr. Johnsie Cancel    History of Present Illness: Amy Clarke is a 47 y.o. female who presents today for a work in visit. Seen for Dr. Johnsie Cancel.   She was seen here back in 2017 for tachycardia and cardiovascular risk factors which include DM, HTN and HLD. EKG with narrow complex tachycardia vs long RP tachycardia. She had had a viral component.  Echo with normal EF. Other issues include asthma. FH + for father having early CAD with an MI in his 16's.   She has not been seen here since February of 2018 - was felt to be doing ok at that time.   Comes in today. Here alone. Called for an appointment today.  Lots of symptoms. For the past month she has had a chest pressure sensation in her upper sternum. This is pretty constant. Worse with pain with minimal exertion. Gets better with rest.  Profound fatigue as well. She has had progressive DOE as well - to the point that she can't walk across the room. She is not able to exercise. 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 She did have some old NTG on hand - she has taken this twice - it did help some. She has lots of stress with her family - mother in a SNF and dying. Son has moved out that has mental illness. She has not been able to work due to "can't keep up". Has been trying to "take better care of myself". Her symptoms are progressive. She tried to get up in her husband's truck yesterday and "thought I would die".   Past Medical History:  Diagnosis Date  . Asthma   . Depression   . Diabetes ([place])   . GERD (gastroesophageal reflux disease)   . Hyperlipidemia   . Hypertension   . Kidney cysts   . Kidney stones   . Migraine headache      Past Surgical History:  Procedure Laterality Date  . none       Medications: Current Meds  Medication Sig  . Albuterol Sulfate 108 (90 Base) MCG/ACT AEPB Inhale 2 puffs into the lungs as needed (as needed inhalation).  Marland Kitchen atorvastatin (LIPITOR) 80 MG tablet Take 80 mg by mouth daily.  . Beclomethasone Dipropionate (QNASL) 80 MCG/ACT AERS Place 2 sprays into the nose daily.  . budesonide-formoterol (SYMBICORT) 160-4.5 MCG/ACT inhaler Inhale 2 puffs into the lungs 2 (two) times daily.  Marland Kitchen buPROPion (WELLBUTRIN XL) 150 MG 24 hr tablet Take 150 mg by mouth daily.  . Empagliflozin-metFORMIN HCl ER (SYNJARDY XR) 25-1000 MG TB24 Take 1 tablet by mouth daily.  Marland Kitchen ezetimibe (ZETIA) 10 MG tablet Take 1 tablet (10 mg total) by mouth daily.  . fluticasone (FLONASE) 50 MCG/ACT nasal spray SPRAY 2 SPRAYS INTO EACH NOSTRIL EVERY DAY  . glucose blood (ONE TOUCH ULTRA TEST) test strip Use as instructed to test 3 times daily  . HUMULIN R U-500 KWIKPEN 500 UNIT/ML kwikpen INJECT 60 UNITS INTO THE SKIN 30 MINUTES BEFORE EACH MEAL  . Insulin Degludec (TRESIBA FLEXTOUCH) 200 UNIT/ML SOPN Inject 116 Units into the skin daily.  Marland Kitchen  metoprolol tartrate (LOPRESSOR) 25 MG tablet Take 1 tablet (25 mg total) by mouth 2 (two) times daily. Please make overdue appt with Dr. Johnsie Cancel before anymore refills. 1st attempt  . nitroGLYCERIN (NITROSTAT) 0.4 MG SL tablet Place 1 tablet (0.4 mg total) under the tongue every 5 (five) minutes as needed for chest pain.  Marland Kitchen omeprazole (PRILOSEC) 20 MG capsule Take 20 mg by mouth 2 (two) times daily before a meal.   . valsartan (DIOVAN) 320 MG tablet Take 160 mg by mouth 2 (two) times daily.   Marland Kitchen venlafaxine XR (EFFEXOR-XR) 150 MG 24 hr capsule Take 150 mg by mouth daily with breakfast.  . Vitamin D, Cholecalciferol, 1000 units CAPS Take 1 capsule by mouth daily.     Allergies: Allergies  Allergen Reactions  . Farxiga [Dapagliflozin]     YEAST INFECTION   . Januvia [Sitagliptin]      GI upset  . Lisinopril Cough  . Sulfonamide Derivatives     REACTION: rash  . Tanzeum [Albiglutide]     50 mg causes vomiting  . Trulicity [Dulaglutide] Nausea Only    Social History: The patient  reports that she has never smoked. She has never used smokeless tobacco. She reports that she does not drink alcohol or use drugs.   Family History: The patient's family history includes Diabetes in her maternal grandmother and mother; Thyroid disease in her cousin. Father died at 31 from an MI. Her mother is 33 and in a nursing home due to dementia - she has had remote MI as well.   Review of Systems: Please see the history of present illness.   Otherwise, the review of systems is positive for none.   All other systems are reviewed and negative.   Physical Exam: VS:  BP 130/90 (BP Location: Left Arm, Patient Position: Sitting, Cuff Size: Normal)   Pulse 92   Ht 4\' 11"  (1.499 m)   Wt 166 lb 12.8 oz (75.7 kg)   BMI 33.69 kg/m  .  BMI Body mass index is 33.69 kg/m.  Wt Readings from Last 3 Encounters:  10/28/17 166 lb 12.8 oz (75.7 kg)  09/11/17 167 lb 6.4 oz (75.9 kg)  02/12/17 169 lb (76.7 kg)    General: Pleasant. She is short. She is overweight. Alert and in no acute distress but does endorse chest pressure - worse with going to the bathroom during the visit.   HEENT: Normal.  Neck: Supple, no JVD, carotid bruits, or masses noted.  Cardiac: Regular rate and rhythm. No murmurs, rubs, or gallops. No edema.  Respiratory:  Lungs are clear to auscultation bilaterally with normal work of breathing.  GI: Soft and nontender.  MS: No deformity or atrophy. Gait and ROM intact.  Skin: Warm and dry. Color is normal.  Neuro:  Strength and sensation are intact and no gross focal deficits noted.  Psych: Alert, appropriate and with normal affect.   LABORATORY DATA:  EKG:  EKG is ordered today. This demonstrates NSR and is reviewed with Dr. Saunders Revel (DOD).  Lab Results  Component Value  Date   WBC 10.9 (H) 07/19/2009   HGB 13.1 07/19/2009   HCT 39.2 07/19/2009   PLT 219 07/19/2009   GLUCOSE 71 10/28/2017   GLUCOSE 71 10/28/2017   CHOL 215 (H) 10/28/2017   TRIG 265.0 (H) 10/28/2017   HDL 29.60 (L) 10/28/2017   LDLDIRECT 154.0 10/28/2017   ALT 29 10/28/2017   AST 23 10/28/2017   NA 139 10/28/2017   NA  139 10/28/2017   K 4.0 10/28/2017   K 4.0 10/28/2017   CL 102 10/28/2017   CL 102 10/28/2017   CREATININE 0.60 10/28/2017   CREATININE 0.60 10/28/2017   BUN 26 (H) 10/28/2017   BUN 26 (H) 10/28/2017   CO2 29 10/28/2017   CO2 29 10/28/2017   TSH 1.04 09/07/2016   HGBA1C 10.3 (A) 09/11/2017   MICROALBUR 1.1 09/07/2016     BNP (last 3 results) No results for input(s): BNP in the last 8760 hours.  ProBNP (last 3 results) No results for input(s): PROBNP in the last 8760 hours.   Other Studies Reviewed Today:  GXT Study Highlights 03/2016    Blood pressure demonstrated a normal response to exercise.  There was no ST segment deviation noted during stress.   Normal exercise treadmill stress test. Normal functional capacity. Normal BP response to exertion.     Echo Study Conclusions 06/2015  - Left ventricle: The cavity size was normal. Systolic function was   normal. The estimated ejection fraction was in the range of 55%   to 60%. Wall motion was normal; there were no regional wall   motion abnormalities. Left ventricular diastolic function   parameters were normal. - Atrial septum: No defect or patent foramen ovale was identified.  Assessment/Plan:  1. Chest pain -  Very worrisome for unstable angina. She has multiple risk factors which include uncontrolled DM, HTN, obesity, HLD and strongly +FH for early CAD. She does not smoke. The patient was subsequently seen with Dr. Saunders Revel who agrees she needs to be admitted. Probable cath tomorrow. Will make NPO after MN tonight. Patient's husband has been called and he will transport to [facility]. Bed is  available at this time.   2. History of tachycardia - not noted today.   3. DM - uncontrolled by A1C  4. HLD - has just changed to Lipitor  5. Obesity  6. Significant situational stress.     Current medicines are reviewed with the patient today.  The patient does not have concerns regarding medicines other than what has been noted above.  The following changes have been made:  See above.  Labs/ tests ordered today include:    Orders Placed This Encounter  Procedures  . EKG 12-Lead     Disposition:   Further disposition to follow.   Patient is agreeable to this plan and will call if any problems develop in the interim.   SignedTruitt Merle, NP  10/28/2017 3:53 PM  [place] [address] [place], [place]  [address] Phone: [phone] Fax: [phone]

## 2017-10-29 ENCOUNTER — Ambulatory Visit (HOSPITAL_COMMUNITY): Admission: RE | Admit: 2017-10-29 | Payer: 59 | Source: Ambulatory Visit | Admitting: Cardiology

## 2017-10-29 ENCOUNTER — Encounter (HOSPITAL_COMMUNITY): Payer: Self-pay | Admitting: Cardiology

## 2017-10-29 ENCOUNTER — Encounter (HOSPITAL_COMMUNITY)
Admission: AD | Disposition: A | Discharge status: Home / Self Care | Payer: Self-pay | Source: Ambulatory Visit | Attending: Internal Medicine

## 2017-10-29 DIAGNOSIS — R079 Chest pain, unspecified: Secondary | ICD-10-CM | POA: Diagnosis not present

## 2017-10-29 DIAGNOSIS — Z0389 Encounter for observation for other suspected diseases and conditions ruled out: Secondary | ICD-10-CM | POA: Diagnosis not present

## 2017-10-29 DIAGNOSIS — I10 Essential (primary) hypertension: Secondary | ICD-10-CM | POA: Diagnosis not present

## 2017-10-29 DIAGNOSIS — E1165 Type 2 diabetes mellitus with hyperglycemia: Secondary | ICD-10-CM | POA: Diagnosis not present

## 2017-10-29 DIAGNOSIS — IMO0001 Reserved for inherently not codable concepts without codable children: Secondary | ICD-10-CM

## 2017-10-29 DIAGNOSIS — R0789 Other chest pain: Secondary | ICD-10-CM | POA: Diagnosis not present

## 2017-10-29 HISTORY — PX: LEFT HEART CATH AND CORONARY ANGIOGRAPHY: CATH118249

## 2017-10-29 LAB — CBC
HCT: 41.5 % (ref 36.0–46.0)
HEMOGLOBIN: 13.1 g/dL (ref 12.0–15.0)
MCH: 26.7 pg (ref 26.0–34.0)
MCHC: 31.6 g/dL (ref 30.0–36.0)
MCV: 84.5 fL (ref 78.0–100.0)
PLATELETS: 215 10*3/uL (ref 150–400)
RBC: 4.91 MIL/uL (ref 3.87–5.11)
RDW: 15.6 % — AB (ref 11.5–15.5)
WBC: 6.3 10*3/uL (ref 4.0–10.5)

## 2017-10-29 LAB — TROPONIN I: Troponin I: <0.03 ng/mL (ref <0.03)

## 2017-10-29 LAB — GLUCOSE, CAPILLARY
GLUCOSE-CAPILLARY: 109 mg/dL — AB (ref 70–99)
GLUCOSE-CAPILLARY: 165 mg/dL — AB (ref 70–99)
Glucose-Capillary: 125 mg/dL — ABNORMAL HIGH (ref 70–99)
Glucose-Capillary: 128 mg/dL — ABNORMAL HIGH (ref 70–99)
Glucose-Capillary: 142 mg/dL — ABNORMAL HIGH (ref 70–99)

## 2017-10-29 LAB — HEPARIN LEVEL (UNFRACTIONATED): HEPARIN UNFRACTIONATED: 0.29 [IU]/mL — AB (ref 0.30–0.70)

## 2017-10-29 SURGERY — LEFT HEART CATH AND CORONARY ANGIOGRAPHY
Anesthesia: LOCAL

## 2017-10-29 MED ORDER — VERAPAMIL HCL 2.5 MG/ML IV SOLN
INTRAVENOUS | Status: DC | PRN
  Administered 2017-10-29: 10 mL via INTRA_ARTERIAL

## 2017-10-29 MED ORDER — FENTANYL CITRATE (PF) 100 MCG/2ML IJ SOLN
INTRAMUSCULAR | Status: AC
  Filled 2017-10-29: qty 2

## 2017-10-29 MED ORDER — SODIUM CHLORIDE 0.9 % WEIGHT BASED INFUSION
1.0000 mL/kg/h | INTRAVENOUS | Status: DC
  Administered 2017-10-29: 1 mL/kg/h via INTRAVENOUS

## 2017-10-29 MED ORDER — LIDOCAINE HCL (PF) 1 % IJ SOLN
INTRAMUSCULAR | Status: AC
  Filled 2017-10-29: qty 30

## 2017-10-29 MED ORDER — FENTANYL CITRATE (PF) 100 MCG/2ML IJ SOLN
INTRAMUSCULAR | Status: DC | PRN
  Administered 2017-10-29: 25 ug via INTRAVENOUS

## 2017-10-29 MED ORDER — SODIUM CHLORIDE 0.9% FLUSH: 3.0000 mL | Freq: Two times a day (BID) | INTRAVENOUS | Status: DC

## 2017-10-29 MED ORDER — HEPARIN (PORCINE) IN NACL 1000-0.9 UT/500ML-% IV SOLN
INTRAVENOUS | Status: DC | PRN
  Administered 2017-10-29 (×2): 500 mL

## 2017-10-29 MED ORDER — IOHEXOL 350 MG/ML SOLN
INTRAVENOUS | Status: DC | PRN
  Administered 2017-10-29: 75 mL via INTRA_ARTERIAL

## 2017-10-29 MED ORDER — HEPARIN (PORCINE) IN NACL 1000-0.9 UT/500ML-% IV SOLN
INTRAVENOUS | Status: AC
  Filled 2017-10-29: qty 1000

## 2017-10-29 MED ORDER — VERAPAMIL HCL 2.5 MG/ML IV SOLN
INTRAVENOUS | Status: AC
  Filled 2017-10-29: qty 2

## 2017-10-29 MED ORDER — SODIUM CHLORIDE 0.9 % IV SOLN: 250.0000 mL | INTRAVENOUS | Status: DC | PRN

## 2017-10-29 MED ORDER — MIDAZOLAM HCL 2 MG/2ML IJ SOLN
INTRAMUSCULAR | Status: AC
  Filled 2017-10-29: qty 2

## 2017-10-29 MED ORDER — SODIUM CHLORIDE 0.9% FLUSH: 3.0000 mL | INTRAVENOUS | Status: DC | PRN

## 2017-10-29 MED ORDER — HEPARIN SODIUM (PORCINE) 1000 UNIT/ML IJ SOLN
INTRAMUSCULAR | Status: DC | PRN
  Administered 2017-10-29: 3500 [IU] via INTRAVENOUS

## 2017-10-29 MED ORDER — MIDAZOLAM HCL 2 MG/2ML IJ SOLN
INTRAMUSCULAR | Status: DC | PRN
  Administered 2017-10-29: 2 mg via INTRAVENOUS

## 2017-10-29 MED ORDER — LIDOCAINE HCL (PF) 1 % IJ SOLN
INTRAMUSCULAR | Status: DC | PRN
  Administered 2017-10-29: 2 mL

## 2017-10-29 SURGICAL SUPPLY — 12 items
CATH 5FR JL3.5 JR4 ANG PIG MP (CATHETERS) ×2 IMPLANT
DEVICE RAD COMP TR BAND LRG (VASCULAR PRODUCTS) ×2 IMPLANT
GLIDESHEATH SLEND SS 6F .021 (SHEATH) ×2 IMPLANT
GUIDEWIRE INQWIRE 1.5J.035X260 (WIRE) ×1 IMPLANT
INQWIRE 1.5J .035X260CM (WIRE) ×2
KIT HEART LEFT (KITS) ×2 IMPLANT
PACK CARDIAC CATHETERIZATION (CUSTOM PROCEDURE TRAY) ×2 IMPLANT
SHEATH PROBE COVER 6X72 (BAG) ×2 IMPLANT
SYR MEDRAD MARK V 150ML (SYRINGE) ×2 IMPLANT
TRANSDUCER W/STOPCOCK (MISCELLANEOUS) ×2 IMPLANT
TUBING CIL FLEX 10 FLL-RA (TUBING) ×2 IMPLANT
WIRE HI TORQ VERSACORE-J 145CM (WIRE) ×2 IMPLANT

## 2017-10-29 NOTE — Research (Signed)
CADFEM Informed Consent   Subject Name: Amy Clarke  Subject met inclusion and exclusion criteria.  The informed consent form, study requirements and expectations were reviewed with the subject and questions and concerns were addressed prior to the signing of the consent form.  The subject verbalized understanding of the trail requirements.  The subject agreed to participate in the CADFEM trial and signed the informed consent.  The informed consent was obtained prior to performance of any protocol-specific procedures for the subject.  A copy of the signed informed consent was given to the subject and a copy was placed in the subject's medical record.  Neva Seat 10/29/2017, 8:55 AM

## 2017-10-29 NOTE — Interval H&P Note (Signed)
History and Physical Interval Note:  10/29/2017 10:16 AM  Amy Clarke  has presented today for surgery, with the diagnosis of ua  The various methods of treatment have been discussed with the patient and family. After consideration of risks, benefits and other options for treatment, the patient has consented to  Procedure(s): LEFT HEART CATH AND CORONARY ANGIOGRAPHY (N/A) as a surgical intervention .  The patient's history has been reviewed, patient examined, no change in status, stable for surgery.  I have reviewed the patient's chart and labs.  Questions were answered to the patient's satisfaction.   Cath Lab Visit (complete for each Cath Lab visit)  Clinical Evaluation Leading to the Procedure:   ACS: Yes.    Non-ACS:    Anginal Classification: CCS IV  Anti-ischemic medical therapy: Minimal Therapy (1 class of medications)  Non-Invasive Test Results: No non-invasive testing performed  Prior CABG: No previous CABG        [name] 10/29/2017 10:16 AM

## 2017-10-29 NOTE — Discharge Summary (Addendum)
Discharge Summary    Patient ID: Amy Clarke MRN: [id number]; DOB: 01/25/1971  Admit date: 10/28/2017 Discharge date: 10/29/2017  Primary Care Provider: Mayra Neer, MD  Primary Cardiologist: Jenkins Rouge, MD   Discharge Diagnoses    Principal Problem:   Unstable angina [facility]) Active Problems:   Type 2 diabetes mellitus without complication, without long-term current use of insulin (HCC)   Obesity, unspecified   HYPERTENSION, BENIGN ESSENTIAL   Mixed hyperlipidemia   Chest pain  Allergies Allergies  Allergen Reactions  . Wilder Glade [Dapagliflozin] Other (See Comments)    YEAST INFECTION   . Januvia [Sitagliptin] Diarrhea and Nausea And Vomiting  . Lisinopril Cough  . Tanzeum [Albiglutide] Nausea And Vomiting  . Trulicity [Dulaglutide] Nausea Only  . Sulfonamide Derivatives Rash   Diagnostic Studies/Procedures    Cardiac catheterization 10/29/2017:  The left ventricular systolic function is normal.  LV end diastolic pressure is normal.  The left ventricular ejection fraction is 55-65% by visual estimate.    1. Normal coronary anatomy 2. Normal LV function 3. Normal LVEDP  Plan: consider alternative causes of chest pain  No indication for antiplatelet therapy at this time.  History of Present Illness     Amy Kreischer Hardyis a 47 y.o.femalewho was seen in the office as a work in by Dr. Saunders Revel for Dr. Johnsie Cancel on 10/28/17. Per chart review, she was seen back in 2017 for tachycardia and cardiovascular risk factors which included DM, HTN and HLD. EKG with narrow complex tachycardia vs long RP tachycardia in which she was found to have a viral component. Echocardiogram revealed with normal EF (07/07/15). Other issues include asthma. FH + for father having early CAD with an MI in his 64's.   She has not been seen by our service since February of 2018 and was felt to be doing well at that time.   She presented to the office on 10/28/17 with lots of symptoms  including constant chest pressure for approximately one month which was found to be worse with minimal exertion and is relieved with rest as well as profound fatigue and progressive DOE to the point that she can't walk across the room and is not able to exercise. 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 She did have some old NTG on hand and has taken this twice with some relief. She reported lots of stress with her family including her mother in a SNF and dying. Son has moved out that has mental illness. She has not been able to work due to "can't keep up". Has been trying to "take better care of myself."   Given the above symptoms, worrisome for unstable angina with multiple risk factors including uncontrolled DM, HTN, obesity, HLD and strong family history of early CAD.  Plan was to admit the patient for cardiac catheterization.  Hospital Course   On 10/29/2017, patient was taken to the cardiac Cath Lab which revealed Normal coronary anatomy, normal LV function and normal LVEDP.  No recommendations for antiplatelet therapy at this time.  Plan is for risk reduction strategies at this time and will continue all home medications and plan for close follow-up with Dr. Johnsie Cancel or APP in 1 to 2 weeks.   Consultants: None   The patient has been seen and examined by Dr. Margaretann Loveless feels that the patient is stable and ready for discharge on 10/29/2017.  Cath site unremarkable.  Patient is chest pain-free. _____________  Discharge Vitals Blood pressure 104/71, pulse 80, temperature (!) 97.4 F (36.3 C), temperature source Oral, resp. rate 18, height 4\' 11"  (1.499 m), weight 74.8 kg, SpO2 96 %.  Filed Weights   10/28/17 1703 10/29/17 0453  Weight: 75.2 kg 74.8 kg   Labs & Radiologic Studies    CBC Recent Labs    10/28/17 1721 10/29/17 0445  WBC 8.3 6.3  NEUTROABS 4.9  --   HGB 14.2 13.1  HCT 44.4 41.5  MCV 83.3 84.5  PLT 251 888   Basic Metabolic  Panel Recent Labs    10/28/17 1021 10/28/17 1721  NA 139  139 137  K 4.0  4.0 3.6  CL 102  102 103  CO2 29  29 24   GLUCOSE 71  71 81  BUN 26*  26* 19  CREATININE 0.60  0.60 0.58  CALCIUM 9.6  9.6 9.3   Liver Function Tests Recent Labs    10/28/17 1021 10/28/17 1721  AST 23 30  ALT 29 36  ALKPHOS 66 64  BILITOT 0.5 0.5  PROT 7.4 7.3  ALBUMIN 4.2 4.2   Cardiac Enzymes Recent Labs    10/28/17 1721 10/28/17 2252 10/29/17 0445  TROPONINI <0.03 <0.03 <0.03   Fasting Lipid Panel Recent Labs    10/28/17 1021  CHOL 215*  HDL 29.60*  TRIG 265.0*  CHOLHDL 7  LDLDIRECT 154.0   Thyroid Function Tests Recent Labs    10/28/17 1721  TSH 0.770   Disposition   Pt is being discharged home today in good condition.  Follow-up Plans & Appointments   Follow-up Information    [facility] up.   Specialty:  Cardiology Why:  Our office will call you with date and time of close follow-up appointment.  Expect this appointment to be in the next 1 to 2 weeks.  If you have not heard from the office by Thursday of this week please call them for appointment. Contact information: [address] [place] [phone]         Discharge Instructions    Call MD for:  difficulty breathing, headache or visual disturbances   Complete by:  As directed    Call MD for:  extreme fatigue   Complete by:  As directed    Call MD for:  hives   Complete by:  As directed    Call MD for:  persistant dizziness or light-headedness   Complete by:  As directed    Call MD for:  persistant nausea and vomiting   Complete by:  As directed    Call MD for:  redness, tenderness, or signs of infection (pain, swelling, redness, odor or green/yellow discharge around incision site)   Complete by:  As directed    Call MD for:  severe uncontrolled pain   Complete by:  As directed    Call MD for:  temperature >100.4   Complete by:   As directed    Diet - low sodium heart healthy   Complete by:  As directed    Discharge instructions   Complete by:  As directed    Our office will call you with the date and time of follow-up appointment.  Please expect this appointment to be in the next 1 to 2 weeks.  If you have not heard from the office by Thursday please call them for follow-up appointment.  Please restart your metformin on 10/31/2017.  No driving for 3 days.  No lifting over 5 lbs for 1 week. No sexual activity for 1 week. Keep procedure site clean & dry. If you notice increased pain, swelling, bleeding or pus, call/return!  You may shower, but no soaking baths/hot tubs/pools for 1 week.   Increase activity slowly   Complete by:  As directed      Discharge Medications   Allergies as of 10/29/2017      Reactions   Farxiga [dapagliflozin] Other (See Comments)   YEAST INFECTION   Januvia [sitagliptin] Diarrhea, Nausea And Vomiting   Lisinopril Cough   Tanzeum [albiglutide] Nausea And Vomiting   Trulicity [dulaglutide] Nausea Only   Sulfonamide Derivatives Rash      Medication List    TAKE these medications   Albuterol Sulfate 108 (90 Base) MCG/ACT Aepb Inhale 2 puffs into the lungs as needed (shortness of breath).   atorvastatin 80 MG tablet Commonly known as:  LIPITOR Take 80 mg by mouth every evening.   budesonide-formoterol 160-4.5 MCG/ACT inhaler Commonly known as:  SYMBICORT Inhale 2 puffs into the lungs 2 (two) times daily as needed (asthma).   buPROPion 150 MG 24 hr tablet Commonly known as:  WELLBUTRIN XL Take 150 mg by mouth daily.   Empagliflozin-metFORMIN HCl ER 25-1000 MG Tb24 Take 1 tablet by mouth daily.   ezetimibe 10 MG tablet Commonly known as:  ZETIA Take 1 tablet (10 mg total) by mouth daily.   fluticasone 50 MCG/ACT nasal spray Commonly known as:  FLONASE SPRAY 2 SPRAYS INTO EACH NOSTRIL EVERY DAY   glucose blood test strip Use as instructed to test 3 times daily     HUMULIN R U-500 KWIKPEN 500 UNIT/ML kwikpen Generic drug:  insulin regular human CONCENTRATED INJECT 60 UNITS INTO THE SKIN 30 MINUTES BEFORE EACH MEAL What changed:  See the new instructions.   Insulin Degludec 200 UNIT/ML Sopn Inject 116 Units into the skin daily. What changed:    when to take this  reasons to take this   Magnesium 250 MG Tabs Take 250 mg by mouth daily.   metoprolol tartrate 25 MG tablet Commonly known as:  LOPRESSOR Take 1 tablet (25 mg total) by mouth 2 (two) times daily. Please make overdue appt with Dr. Johnsie Cancel before anymore refills. 1st attempt   nitroGLYCERIN 0.4 MG SL tablet Commonly known as:  NITROSTAT Place 1 tablet (0.4 mg total) under the tongue every 5 (five) minutes as needed for chest pain.   omeprazole 20 MG capsule Commonly known as:  PRILOSEC Take 20 mg by mouth 2 (two) times daily before a meal.   valsartan 320 MG tablet Commonly known as:  DIOVAN Take 320 mg by mouth daily.   venlafaxine XR 150 MG 24 hr capsule Commonly known as:  EFFEXOR-XR Take 150 mg by mouth daily with breakfast.   vitamin C 500 MG tablet Commonly known as:  ASCORBIC ACID Take 500 mg by mouth daily.   Vitamin D (Cholecalciferol) 1000 units Caps Take 1,000 Units by mouth daily.        Acute coronary syndrome (MI, NSTEMI, STEMI, etc) this admission?: No.    Outstanding Labs/Studies   None   Duration of Discharge Encounter   Greater than 30 minutes including physician time.  Signed, Kathyrn Drown, NP 10/29/2017, 6:47 PM   -----------------------------------------------------------------------------------   History and all data above reviewed.  Patient examined.  I agree with the findings as above.  Amy Clarke is a pleasant 47 year old female who was admitted to the  hospital for concerns of unstable angina in the setting of multiple risk factors including diabetes mellitus type 2 on insulin therapy, hypertension, hyperlipidemia, and early  family history of MI. She was seen by Dr. Saunders Revel in the clinic and admitted for angiography. Coronary angiography demonstrated normal LV systolic function, normal LV end-diastolic pressure, and normal coronary arteries.  I have shared these results with the patient and her husband who was present in the room.  We discussed the alternate causes of chest pain including cardiac causes such as coronary spasm, as well as noncardiac causes including esophageal, pulmonary, and musculoskeletal pathology.  We agreed that these are not urgent investigations, and can be pursued in close hospital follow-up in the cardiology clinic.  She is a patient of Dr. Johnsie Cancel and has seen Dr. Saunders Revel, and can follow with their clinic teams.  Constitutional: No acute distress CV: cath site was without hematoma. MSK: no edema PSYCH: alert and oriented x 3, normal mood and affect.   All available labs, radiology testing, previous records reviewed. Agree with documented assessment and plan of my colleague as stated above with the following additions or changes:  Principal Problem:   Normal coronary arteries Active Problems:   Type 2 diabetes mellitus without complication, without long-term current use of insulin (HCC)   Obesity, unspecified   HYPERTENSION, BENIGN ESSENTIAL   Mixed hyperlipidemia   Unstable angina (HCC)   Chest pain   Plan: She should continue her home medications as previously prescribed, we have made no changes in hospital.  Testing for coronary spasm and endothelial function could be pursued noninvasively with [facility] at the discretion of her primary cardiologist.  Continued risk factor modification is recommended.  Noncardiac causes of chest pain can continue to be investigated by her primary care physician.  Elouise Munroe, MD [facility] 6:57 PM  10/29/2017

## 2017-10-29 NOTE — Progress Notes (Signed)
[place] for heparin Indication: chest pain/ACS  Allergies  Allergen Reactions  . Wilder Glade [Dapagliflozin] Other (See Comments)    YEAST INFECTION   . Januvia [Sitagliptin] Diarrhea and Nausea And Vomiting  . Lisinopril Cough  . Tanzeum [Albiglutide] Nausea And Vomiting  . Trulicity [Dulaglutide] Nausea Only  . Sulfonamide Derivatives Rash    Patient Measurements: Height: 4\' 11"  (149.9 cm) Weight: 165 lb 12.6 oz (75.2 kg) IBW/kg (Calculated) : 43.2 Heparin Dosing Weight: 60kg  Vital Signs: Temp: 98.5 F (36.9 C) (09/30 2100) Temp Source: Oral (09/30 2100) BP: 113/72 (09/30 2120) Pulse Rate: 100 (09/30 2120)  Labs: Recent Labs    10/28/17 1021 10/28/17 1721 10/28/17 2252  HGB  --  14.2  --   HCT  --  44.4  --   PLT  --  251  --   APTT  --  31  --   LABPROT  --  12.9  --   INR  --  0.98  --   HEPARINUNFRC  --   --  0.27*  CREATININE 0.60  0.60 0.58  --   TROPONINI  --  <0.03  --     Estimated Creatinine Clearance: 76 mL/min (by C-G formula based on SCr of 0.58 mg/dL).   Medical History: Past Medical History:  Diagnosis Date  . Asthma   . Depression   . Diabetes ([place])   . GERD (gastroesophageal reflux disease)   . Hyperlipidemia   . Hypertension   . Kidney cysts   . Kidney stones   . Migraine headache     Assessment: 28 yoF admitted with CP and possible ACS. Pharmacy consulted for IV heparin. No OAC noted PTA, planning for cardiac cath at some point tomorrow 10/1. Initial heparin level 0.27 units/ml  Goal of Therapy:  Heparin level 0.3-0.7 units/ml Monitor platelets by anticoagulation protocol: Yes   Plan:  -Increase Heparin 900 units/hr -Check 6-hr heparin level -Monitor heparin level, CBC, S/Sx bleeding daily  Excell Seltzer, PharmD Clinical Pharmacist 10/29/2017

## 2017-10-30 ENCOUNTER — Telehealth: Payer: Self-pay | Admitting: Cardiovascular Disease

## 2017-10-30 NOTE — Telephone Encounter (Signed)
New message:      TOC appt on 10/69/19 at 10:00 w/ [facility] per Sharee Pimple

## 2017-10-30 NOTE — Telephone Encounter (Signed)
**Note De-Identified Amy Clarke Obfuscation** Patient contacted regarding discharge from [facility] on 10/29/2017.  Patient understands to follow up with provider Truitt Merle, NP on 11/06/17 at 10 am at [facility] in [place]. Patient understands discharge instructions? Yes Patient understands medications and regiment? Yes Patient understands to bring all medications to this visit? Yes

## 2017-10-31 ENCOUNTER — Encounter: Payer: Self-pay | Admitting: Endocrinology

## 2017-10-31 ENCOUNTER — Ambulatory Visit (INDEPENDENT_AMBULATORY_CARE_PROVIDER_SITE_OTHER): Payer: 59 | Admitting: Endocrinology

## 2017-10-31 ENCOUNTER — Other Ambulatory Visit: Payer: Self-pay | Admitting: Endocrinology

## 2017-10-31 VITALS — BP 128/92 | HR 95 | Ht 59.0 in | Wt 162.0 lb

## 2017-10-31 DIAGNOSIS — Z794 Long term (current) use of insulin: Secondary | ICD-10-CM

## 2017-10-31 DIAGNOSIS — E1165 Type 2 diabetes mellitus with hyperglycemia: Secondary | ICD-10-CM

## 2017-10-31 DIAGNOSIS — E782 Mixed hyperlipidemia: Secondary | ICD-10-CM

## 2017-10-31 MED ORDER — INSULIN ASPART (W/NIACINAMIDE) 100 UNIT/ML ~~LOC~~ SOPN: 20.0000 [IU] | PEN_INJECTOR | Freq: Two times a day (BID) | SUBCUTANEOUS | 1 refills | Status: DC

## 2017-10-31 MED ORDER — INSULIN LISPRO 100 UNIT/ML (KWIKPEN): 20.0000 [IU] | PEN_INJECTOR | Freq: Two times a day (BID) | SUBCUTANEOUS | 0 refills | Status: DC

## 2017-10-31 NOTE — Progress Notes (Signed)
Patient ID: Amy Clarke, female   DOB: 09-18-1970, 47 y.o.   MRN: [id number]           Reason for Appointment:  Follow-up for Type 2 Diabetes  Referring physician: Mayra Neer   History of Present Illness:          Date of diagnosis of type 2 diabetes mellitus: 2003?         Background history:   She had gestational diabetes in 1992 and subsequently was on diet alone She thinks she was started on diabetes medication about 15 years ago and probably took metformin which she took until about a year ago and this was stopped because of diarrhea.  Records show that she was taking 1000 mg of regular metformin twice a day With her PCP she has been on numerous diabetes medications over the years including Byetta, Trulicity, Jardiance and Januvia which apparently all caused side effects, mostly nausea or yeast infections with Vania Rea and Farxiga Her A1c has been mostly higher this year, last year has been as low as 7.2, in January her A1c was 8.4  Recent history:   INSULIN regimen is:  [place] U-200, 116 units in the morning.  Humulin R U-500, none  Non-insulin hypoglycemic drugs the patient is taking are: Synjardy  Her A1c last was 10.3 in August  Current management, blood sugar patterns and problems identified:  She has had a dramatic improvement in her blood sugars at home since her visit in August  At that time she was started back on Synjardy  Also she says that she has significantly cut back on her carbohydrate intake since her last visit; also previously was doing more stress eating  She appears to be much more motivated than usual  Although she was told to start taking the Humulin R because of her high readings she is taking this only when she is eating significant amount of carbohydrates  Also because of relatively lower blood sugars she has taken only 116 units of Tresiba once daily instead of 100 units twice a day that she was taking  No side effects with  Synjardy  FASTING blood sugars are excellent although occasionally higher  She will have periodic high readings after supper based on her carbohydrate intake  However she was frequently not take her Humulin R until she is ready to eat instead of 30 minutes before  This has at least once because her blood sugar to be low including once at 2 AM; blood sugar was higher after supper the previous night when eating pizza Usually is eating only small amounts of bread and leaving off other carbohydrates She has not tried the freestyle libre again, had difficulty keeping it on previously       Side effects from medications have been: Diarhea with regular metformin  Compliance with the medical regimen: Fairly good Hypoglycemia:   only if she is doing a lot of exercise  Glucose monitoring:  done  1-2 times a day         Glucometer: One Touch/       Blood Glucose readings by review her monitor download   PRE-MEAL Fasting Lunch Dinner  overnight Overall  Glucose range:  67-179   63-140  50-174  50-270  Mean/median:  109  134  95   120+/-49   POST-MEAL PC Breakfast PC Lunch PC Dinner  Glucose range:    53-270  Mean/median:    150     Self-care: The diet that  the patient has been following is: tries to limit high-fat foods and drinks with sugar .     Typical meal intake: Breakfast is sometimes cereal/bagel/granola Bar.  Usually eating breakfast only if she is working  [employer] usually yogurt, cheese, fruit and crackers.  Dinner chicken with vegetables.  She will have snacks with granola bar or popcorn                Dietician visit, most recent: Several years ago in class               Exercise: walking less  Weight history:  Wt Readings from Last 3 Encounters:  10/31/17 162 lb (73.5 kg)  10/29/17 164 lb 12.8 oz (74.8 kg)  10/28/17 166 lb 12.8 oz (75.7 kg)    Glycemic control:   Lab Results  Component Value Date   HGBA1C 10.3 (A) 09/11/2017   HGBA1C 8.0 (H) 02/08/2017   HGBA1C  8.4 09/07/2016   Lab Results  Component Value Date   MICROALBUR 1.1 09/07/2016   CREATININE 0.58 10/28/2017   Lab Results  Component Value Date   MICRALBCREAT 1.2 09/07/2016    Lab Results  Component Value Date   FRUCTOSAMINE 372 (H) 09/10/2017   FRUCTOSAMINE 216 11/16/2016      Allergies as of 10/31/2017      Reactions   Farxiga [dapagliflozin] Other (See Comments)   YEAST INFECTION   Januvia [sitagliptin] Diarrhea, Nausea And Vomiting   Lisinopril Cough   Tanzeum [albiglutide] Nausea And Vomiting   Trulicity [dulaglutide] Nausea Only   Sulfonamide Derivatives Rash      Medication List        Accurate as of 10/31/17  1:57 PM. Always use your most recent med list.          Albuterol Sulfate 108 (90 Base) MCG/ACT Aepb Inhale 2 puffs into the lungs as needed (shortness of breath).   atorvastatin 80 MG tablet Commonly known as:  LIPITOR Take 80 mg by mouth every evening.   budesonide-formoterol 160-4.5 MCG/ACT inhaler Commonly known as:  SYMBICORT Inhale 2 puffs into the lungs 2 (two) times daily as needed (asthma).   buPROPion 150 MG 24 hr tablet Commonly known as:  WELLBUTRIN XL Take 150 mg by mouth daily.   Empagliflozin-metFORMIN HCl ER 25-1000 MG Tb24 Take 1 tablet by mouth daily.   ezetimibe 10 MG tablet Commonly known as:  ZETIA Take 1 tablet (10 mg total) by mouth daily.   fluticasone 50 MCG/ACT nasal spray Commonly known as:  FLONASE SPRAY 2 SPRAYS INTO EACH NOSTRIL EVERY DAY   glucose blood test strip Use as instructed to test 3 times daily   HUMULIN R U-500 KWIKPEN 500 UNIT/ML kwikpen Generic drug:  insulin regular human CONCENTRATED INJECT 60 UNITS INTO THE SKIN 30 MINUTES BEFORE EACH MEAL   Insulin Aspart (w/Niacinamide) 100 UNIT/ML Sopn Inject 20 Units into the skin 2 (two) times daily after a meal.   Insulin Degludec 200 UNIT/ML Sopn Inject 116 Units into the skin daily.   Magnesium 250 MG Tabs Take 250 mg by mouth daily.    metoprolol tartrate 25 MG tablet Commonly known as:  LOPRESSOR Take 1 tablet (25 mg total) by mouth 2 (two) times daily. Please make overdue appt with Dr. Johnsie Cancel before anymore refills. 1st attempt   nitroGLYCERIN 0.4 MG SL tablet Commonly known as:  NITROSTAT Place 1 tablet (0.4 mg total) under the tongue every 5 (five) minutes as needed for chest pain.   omeprazole  20 MG capsule Commonly known as:  PRILOSEC Take 20 mg by mouth 2 (two) times daily before a meal.   valsartan 320 MG tablet Commonly known as:  DIOVAN Take 320 mg by mouth daily.   venlafaxine XR 150 MG 24 hr capsule Commonly known as:  EFFEXOR-XR Take 150 mg by mouth daily with breakfast.   vitamin C 500 MG tablet Commonly known as:  ASCORBIC ACID Take 500 mg by mouth daily.   Vitamin D (Cholecalciferol) 1000 units Caps Take 1,000 Units by mouth daily.       Allergies:  Allergies  Allergen Reactions  . Wilder Glade [Dapagliflozin] Other (See Comments)    YEAST INFECTION   . Januvia [Sitagliptin] Diarrhea and Nausea And Vomiting  . Lisinopril Cough  . Tanzeum [Albiglutide] Nausea And Vomiting  . Trulicity [Dulaglutide] Nausea Only  . Sulfonamide Derivatives Rash    Past Medical History:  Diagnosis Date  . Asthma   . Depression   . Diabetes ([place])   . GERD (gastroesophageal reflux disease)   . Hyperlipidemia   . Hypertension   . Kidney cysts   . Kidney stones   . Migraine headache     Past Surgical History:  Procedure Laterality Date  . LEFT HEART CATH AND CORONARY ANGIOGRAPHY N/A 10/29/2017   Procedure: LEFT HEART CATH AND CORONARY ANGIOGRAPHY;  Surgeon: Martinique, Peter M, MD;  Location: [facility];  Service: Cardiovascular;  Laterality: N/A;  . none      Family History  Problem Relation Age of Onset  . Diabetes Mother   . Diabetes Maternal Grandmother   . Thyroid disease Cousin     Social History:  reports that she has never smoked. She has never used smokeless tobacco. She reports  that she does not drink alcohol or use drugs.   Review of Systems   Lipid history: Last LDL 149, previously better, has been on Crestor instead of Lipitor 80 mg since about May from PCP, no recent labs available from PCP, triglycerides also are high    Lab Results  Component Value Date   CHOL 215 (H) 10/28/2017   HDL 29.60 (L) 10/28/2017   LDLDIRECT 154.0 10/28/2017   TRIG 265.0 (H) 10/28/2017   CHOLHDL 7 10/28/2017           Hypertension: Blood pressure has been high since her 46s, managed by PCP with valsartan  Most recent eye exam was In 04/2016  Most recent foot exam: 8/18  She has been on bupropion for her depression for PCP   Physical Examination:  BP (!) 128/92   Pulse 95   Ht 4\' 11"  (1.499 m)   Wt 162 lb (73.5 kg)   SpO2 98%   BMI 32.72 kg/m       ASSESSMENT:  Diabetes type 2, uncontrolled with BMI 33  See history of present illness for detailed discussion of current diabetes management, blood sugar patterns and problems identified  Her A1c last was 10.3  However her blood sugars are averaging only about 127 compared to 221 on her last visit with improved diet and restarting Synjardy She is also much more motivated to watch her diet especially with cutting back on carbohydrates and portions Surprisingly however has not lost weight as yet She can do better with exercise She has difficulty covering her meals that have carbohydrate as she cannot remember to take her Humulin R before eating Fasting readings are mostly controlled with 116 units of Tresiba which is a dose that she  has come up on her own   HYPERTENSION: Blood pressure is still not controlled and discussed need for her to follow-up with her PCP or cardiologist Not clear if she is symptomatic with her cardiovascular symptoms because of high blood pressure   LIPIDS: LDL significantly high and about the same; today she cannot be sure whether she is taking the Zetia that was prescribed in  addition to her statin on the last visit Her lipids should be better controlled because of her improved blood sugar control but she does need more weight loss for improved triglycerides  PLAN:    She will try FIASP insulin instead of Humulin R since she cannot time her regular insulin 30 minutes before  Also she is not needing mealtime insulin consistently and only when eating carbohydrates and small portions and her diet is usually better  Also since she is taking less insulin overall she may build to get by with 20 to 30 units of the Fiasp insulin or even less  Discussed that she can take this right at mealtimes and even right after eating  She will not change her Tyler Aas unless her morning sugars are consistently above normal  Encourage her to be consistently active with exercise  More blood sugars after meals  Also may reconsider using the freestyle libre sensor  For her lipids she needs to make sure she is taking Zetia since LDL does not appear to be any better Also she needs to make sure she is taking Crestor instead of Lipitor   Patient Instructions  Check on Zetia     Counseling time on subjects discussed in assessment and plan sections is over 50% of today's 25 minute visit     Elayne Snare 10/31/2017, 1:57 PM   Note: This office note was prepared with Dragon voice recognition system technology. Any transcriptional errors that result from this process are unintentional.

## 2017-10-31 NOTE — Telephone Encounter (Signed)
Send the same dose of Humalog

## 2017-10-31 NOTE — Patient Instructions (Signed)
Check on Zetia

## 2017-10-31 NOTE — Telephone Encounter (Signed)
Please advise pharmacy stated Amy Clarke is not covered.

## 2017-11-05 ENCOUNTER — Other Ambulatory Visit: Payer: Self-pay | Admitting: Family Medicine

## 2017-11-05 ENCOUNTER — Ambulatory Visit
Admission: RE | Admit: 2017-11-05 | Discharge: 2017-11-05 | Disposition: A | Discharge status: Home / Self Care | Payer: 59 | Source: Ambulatory Visit | Attending: Family Medicine | Admitting: Family Medicine

## 2017-11-05 DIAGNOSIS — R0600 Dyspnea, unspecified: Secondary | ICD-10-CM

## 2017-11-05 DIAGNOSIS — R0609 Other forms of dyspnea: Secondary | ICD-10-CM

## 2017-11-05 DIAGNOSIS — E1165 Type 2 diabetes mellitus with hyperglycemia: Secondary | ICD-10-CM | POA: Diagnosis not present

## 2017-11-05 DIAGNOSIS — J45909 Unspecified asthma, uncomplicated: Secondary | ICD-10-CM | POA: Diagnosis not present

## 2017-11-05 DIAGNOSIS — E782 Mixed hyperlipidemia: Secondary | ICD-10-CM | POA: Diagnosis not present

## 2017-11-05 DIAGNOSIS — R5383 Other fatigue: Secondary | ICD-10-CM | POA: Diagnosis not present

## 2017-11-06 ENCOUNTER — Encounter: Payer: Self-pay | Admitting: Nurse Practitioner

## 2017-11-06 ENCOUNTER — Ambulatory Visit: Payer: 59 | Admitting: Nurse Practitioner

## 2017-11-06 VITALS — BP 138/90 | HR 85 | Ht 59.0 in | Wt 164.1 lb

## 2017-11-06 DIAGNOSIS — Z9889 Other specified postprocedural states: Secondary | ICD-10-CM | POA: Diagnosis not present

## 2017-11-06 MED ORDER — NITROGLYCERIN 0.4 MG SL SUBL: 0.4000 mg | SUBLINGUAL_TABLET | SUBLINGUAL | 3 refills | Status: DC | PRN

## 2017-11-06 NOTE — Progress Notes (Signed)
CARDIOLOGY OFFICE NOTE  Date:  11/06/2017    Amy Clarke Date of Birth: 02/03/1970 Medical Record #[id number]  PCP:  Mayra Neer, MD  Cardiologist:  Gillian Shields   Chief Complaint  Patient presents with  . Follow-up    Post cath visit - seen for Dr. Johnsie Cancel    History of Present Illness: Amy Clarke is a 47 y.o. female who presents today for a post hospital visit. Seen for Dr. Johnsie Cancel.   She was seen here back in 2017 for tachycardia and cardiovascular risk factors which include DM, HTN and HLD. EKG with narrow complex tachycardia vs long RP tachycardia. She had had a viral component.  Echo with normal EF. Other issues include asthma. FH + for father having early CAD with an MI in his 74's.   She had not been seen here since February of 2018 - was felt to be doing ok at that time. Then presented earlier this month with lots of symptoms - active chest pain - worrisome for angina - admitted from the office and referred for cardiac cath.   Comes in today. Here alone. She is doing well. Says she "was pleasantly surprised" about the results. She has seen her PCP - has had CXR - this was normal and resulted to her. She may be having CT scan. She seems motivated to work on CV risk factor modification.   Past Medical History:  Diagnosis Date  . Asthma   . Depression   . Diabetes ([place])   . GERD (gastroesophageal reflux disease)   . Hyperlipidemia   . Hypertension   . Kidney cysts   . Kidney stones   . Migraine headache     Past Surgical History:  Procedure Laterality Date  . LEFT HEART CATH AND CORONARY ANGIOGRAPHY N/A 10/29/2017   Procedure: LEFT HEART CATH AND CORONARY ANGIOGRAPHY;  Surgeon: Martinique, Peter M, MD;  Location: [facility];  Service: Cardiovascular;  Laterality: N/A;  . none       Medications: Current Meds  Medication Sig  . Albuterol Sulfate 108 (90 Base) MCG/ACT AEPB Inhale 2 puffs into the lungs as needed (shortness of breath).     Marland Kitchen atorvastatin (LIPITOR) 80 MG tablet Take 80 mg by mouth every evening.   . budesonide-formoterol (SYMBICORT) 160-4.5 MCG/ACT inhaler Inhale 2 puffs into the lungs 2 (two) times daily as needed (asthma).   Marland Kitchen buPROPion (WELLBUTRIN XL) 150 MG 24 hr tablet Take 150 mg by mouth daily.  . Empagliflozin-metFORMIN HCl ER (SYNJARDY XR) 25-1000 MG TB24 Take 1 tablet by mouth daily.  Marland Kitchen ezetimibe (ZETIA) 10 MG tablet Take 1 tablet (10 mg total) by mouth daily.  . fluticasone (FLONASE) 50 MCG/ACT nasal spray SPRAY 2 SPRAYS INTO EACH NOSTRIL EVERY DAY  . glucose blood (ONE TOUCH ULTRA TEST) test strip Use as instructed to test 3 times daily  . Insulin Aspart, w/Niacinamide, (FIASP FLEXTOUCH) 100 UNIT/ML SOPN Inject 20 Units into the skin 2 (two) times daily after a meal.  . Insulin Degludec (TRESIBA FLEXTOUCH) 200 UNIT/ML SOPN Inject 116 Units into the skin daily. (Patient taking differently: Inject 116 Units into the skin daily as needed (based on diet). )  . insulin lispro (HUMALOG KWIKPEN) 100 UNIT/ML KiwkPen Inject 0.2 mLs (20 Units total) into the skin 2 (two) times daily.  . Magnesium 250 MG TABS Take 250 mg by mouth daily.  . metoprolol tartrate (LOPRESSOR) 25 MG tablet Take 25 mg by mouth 2 (  two) times daily.  . nitroGLYCERIN (NITROSTAT) 0.4 MG SL tablet Place 1 tablet (0.4 mg total) under the tongue every 5 (five) minutes as needed for chest pain.  Marland Kitchen omeprazole (PRILOSEC) 20 MG capsule Take 20 mg by mouth 2 (two) times daily before a meal.   . valsartan (DIOVAN) 320 MG tablet Take 320 mg by mouth daily.   Marland Kitchen venlafaxine XR (EFFEXOR-XR) 150 MG 24 hr capsule Take 150 mg by mouth daily with breakfast.  . vitamin C (ASCORBIC ACID) 500 MG tablet Take 500 mg by mouth daily.  . Vitamin D, Cholecalciferol, 1000 units CAPS Take 1,000 Units by mouth daily.   . [DISCONTINUED] metoprolol tartrate (LOPRESSOR) 25 MG tablet Take 1 tablet (25 mg total) by mouth 2 (two) times daily. Please make overdue appt with Dr.  Johnsie Cancel before anymore refills. 1st attempt (Patient taking differently: Take 25 mg by mouth 2 (two) times daily. )  . [DISCONTINUED] nitroGLYCERIN (NITROSTAT) 0.4 MG SL tablet Place 1 tablet (0.4 mg total) under the tongue every 5 (five) minutes as needed for chest pain.     Allergies: Allergies  Allergen Reactions  . Wilder Glade [Dapagliflozin] Other (See Comments)    YEAST INFECTION   . Januvia [Sitagliptin] Diarrhea and Nausea And Vomiting  . Lisinopril Cough  . Tanzeum [Albiglutide] Nausea And Vomiting  . Trulicity [Dulaglutide] Nausea Only  . Sulfonamide Derivatives Rash    Social History: The patient  reports that she has never smoked. She has never used smokeless tobacco. She reports that she does not drink alcohol or use drugs.   Family History: The patient's family history includes Diabetes in her maternal grandmother and mother; Thyroid disease in her cousin.   Review of Systems: Please see the history of present illness.   Otherwise, the review of systems is positive for none.   All other systems are reviewed and negative.   Physical Exam: VS:  BP 138/90 (BP Location: Left Arm, Patient Position: Sitting, Cuff Size: Normal)   Pulse 85   Ht 4\' 11"  (1.499 m)   Wt 164 lb 1.9 oz (74.4 kg)   SpO2 98% Comment: at rest  BMI 33.15 kg/m  .  BMI Body mass index is 33.15 kg/m.  Wt Readings from Last 3 Encounters:  11/06/17 164 lb 1.9 oz (74.4 kg)  10/31/17 162 lb (73.5 kg)  10/29/17 164 lb 12.8 oz (74.8 kg)    General: Pleasant. Well developed, well nourished and in no acute distress.  Skin warm and dry. Color is normal.  Her cath site is fine.     LABORATORY DATA:  EKG:  EKG is not ordered today.  Lab Results  Component Value Date   WBC 6.3 10/29/2017   HGB 13.1 10/29/2017   HCT 41.5 10/29/2017   PLT 215 10/29/2017   GLUCOSE 81 10/28/2017   CHOL 215 (H) 10/28/2017   TRIG 265.0 (H) 10/28/2017   HDL 29.60 (L) 10/28/2017   LDLDIRECT 154.0 10/28/2017   ALT 36  10/28/2017   AST 30 10/28/2017   NA 137 10/28/2017   K 3.6 10/28/2017   CL 103 10/28/2017   CREATININE 0.58 10/28/2017   BUN 19 10/28/2017   CO2 24 10/28/2017   TSH 0.770 10/28/2017   INR 0.98 10/28/2017   HGBA1C 10.3 (A) 09/11/2017   MICROALBUR 1.1 09/07/2016     BNP (last 3 results) Recent Labs    10/28/17 1721  BNP 11.7    ProBNP (last 3 results) No results for input(s): PROBNP in  the last 8760 hours.   Other Studies Reviewed Today:  LEFT HEART CATH AND CORONARY ANGIOGRAPHY 10/2017  Conclusion     The left ventricular systolic function is normal.  LV end diastolic pressure is normal.  The left ventricular ejection fraction is 55-65% by visual estimate.    1. Normal coronary anatomy 2. Normal LV function 3. Normal LVEDP  Plan: consider alternative causes of chest pain  No indication for antiplatelet therapy at this time.     GXT Study Highlights 03/2016    Blood pressure demonstrated a normal response to exercise.  There was no ST segment deviation noted during stress.  Normal exercise treadmill stress test. Normal functional capacity. Normal BP response to exertion.     Echo Study Conclusions 06/2015  - Left ventricle: The cavity size was normal. Systolic function was normal. The estimated ejection fraction was in the range of 55% to 60%. Wall motion was normal; there were no regional wall motion abnormalities. Left ventricular diastolic function parameters were normal. - Atrial septum: No defect or patent foramen ovale was identified.  Assessment/Plan:  1. S/p cardiac cath for chest pain - this is reassuring. Normal coronaries noted. She had prior normal echo 2 years ago.  She is following up with PCP regarding further work up. CXR normal from yesterday.   2. History of tachycardia - on beta blocker. Not an issue.   3. DM - uncontrolled by A1C - per PCP  4. HLD - has just changed to Lipitor  5. Obesity - CV risk  factor modification encouraged.   6. Significant situational stress. This may be the culprit of her symptoms.    Current medicines are reviewed with the patient today.  The patient does not have concerns regarding medicines other than what has been noted above.  The following changes have been made:  See above.  Labs/ tests ordered today include:   No orders of the defined types were placed in this encounter.    Disposition:   FU with [place] as needed.   Patient is agreeable to this plan and will call if any problems develop in the interim.   SignedTruitt Merle, NP  11/06/2017 10:23 AM  [facility] [address] [place], [place]  [address] Phone: [phone] Fax: [phone]

## 2017-11-06 NOTE — Patient Instructions (Addendum)
We will be checking the following labs today - NONE   Medication Instructions:    Continue with your current medicines.    If you need a refill on your cardiac medications before your next appointment, please call your pharmacy.     Testing/Procedures To Be Arranged:  N/A  Follow-Up:   See [place] back as needed.     At [facility], you and your health needs are our priority.  As part of our continuing mission to provide you with exceptional heart care, we have created designated Provider Care Teams.  These Care Teams include your primary Cardiologist (physician) and Advanced Practice Providers (APPs -  Physician Assistants and Nurse Practitioners) who all work together to provide you with the care you need, when you need it.  Special Instructions:  . None  Call the [place] office at [phone] if you have any questions, problems or concerns.

## 2017-11-28 ENCOUNTER — Other Ambulatory Visit: Payer: Self-pay | Admitting: Cardiovascular Disease

## 2017-12-22 ENCOUNTER — Other Ambulatory Visit: Payer: Self-pay | Admitting: Endocrinology

## 2017-12-23 ENCOUNTER — Other Ambulatory Visit: Payer: Self-pay | Admitting: Endocrinology

## 2018-02-03 ENCOUNTER — Ambulatory Visit: Payer: 59 | Admitting: Endocrinology

## 2018-02-24 ENCOUNTER — Ambulatory Visit: Payer: 59 | Admitting: Endocrinology

## 2018-03-19 DIAGNOSIS — Z23 Encounter for immunization: Secondary | ICD-10-CM | POA: Diagnosis not present

## 2018-03-19 DIAGNOSIS — E782 Mixed hyperlipidemia: Secondary | ICD-10-CM | POA: Diagnosis not present

## 2018-03-19 DIAGNOSIS — Z0000 Encounter for general adult medical examination without abnormal findings: Secondary | ICD-10-CM | POA: Diagnosis not present

## 2018-03-19 DIAGNOSIS — N181 Chronic kidney disease, stage 1: Secondary | ICD-10-CM | POA: Diagnosis not present

## 2018-04-01 ENCOUNTER — Other Ambulatory Visit: Payer: Self-pay | Admitting: Family Medicine

## 2018-04-01 DIAGNOSIS — Z1231 Encounter for screening mammogram for malignant neoplasm of breast: Secondary | ICD-10-CM

## 2018-04-23 ENCOUNTER — Other Ambulatory Visit: Payer: Self-pay | Admitting: Endocrinology

## 2018-05-08 ENCOUNTER — Other Ambulatory Visit (INDEPENDENT_AMBULATORY_CARE_PROVIDER_SITE_OTHER): Payer: 59

## 2018-05-08 ENCOUNTER — Other Ambulatory Visit: Payer: Self-pay

## 2018-05-08 DIAGNOSIS — E782 Mixed hyperlipidemia: Secondary | ICD-10-CM | POA: Diagnosis not present

## 2018-05-08 DIAGNOSIS — E1165 Type 2 diabetes mellitus with hyperglycemia: Secondary | ICD-10-CM | POA: Diagnosis not present

## 2018-05-08 DIAGNOSIS — Z794 Long term (current) use of insulin: Secondary | ICD-10-CM | POA: Diagnosis not present

## 2018-05-08 LAB — COMPREHENSIVE METABOLIC PANEL
ALT: 35 U/L (ref 0–35)
AST: 21 U/L (ref 0–37)
Albumin: 4.2 g/dL (ref 3.5–5.2)
Alkaline Phosphatase: 94 U/L (ref 39–117)
BUN: 13 mg/dL (ref 6–23)
CO2: 26 mEq/L (ref 19–32)
Calcium: 9.4 mg/dL (ref 8.4–10.5)
Chloride: 99 mEq/L (ref 96–112)
Creatinine, Ser: 0.64 mg/dL (ref 0.40–1.20)
GFR: 98.57 mL/min (ref >60.00)
Glucose, Bld: 252 mg/dL — ABNORMAL HIGH (ref 70–99)
Potassium: 3.9 mEq/L (ref 3.5–5.1)
Sodium: 135 mEq/L (ref 135–145)
Total Bilirubin: 0.5 mg/dL (ref 0.2–1.2)
Total Protein: 7.6 g/dL (ref 6.0–8.3)

## 2018-05-08 LAB — LIPID PANEL
Cholesterol: 210 mg/dL — ABNORMAL HIGH (ref 0–200)
HDL: 33.2 mg/dL — ABNORMAL LOW (ref >39.00)
Total CHOL/HDL Ratio: 6
Triglycerides: 451 mg/dL — ABNORMAL HIGH (ref 0.0–149.0)

## 2018-05-08 LAB — LDL CHOLESTEROL, DIRECT: Direct LDL: 125 mg/dL

## 2018-05-08 LAB — HEMOGLOBIN A1C: Hgb A1c MFr Bld: 9.7 % — ABNORMAL HIGH (ref 4.6–6.5)

## 2018-05-14 ENCOUNTER — Ambulatory Visit (INDEPENDENT_AMBULATORY_CARE_PROVIDER_SITE_OTHER): Payer: 59 | Admitting: Endocrinology

## 2018-05-14 ENCOUNTER — Other Ambulatory Visit: Payer: Self-pay

## 2018-05-14 ENCOUNTER — Encounter: Payer: Self-pay | Admitting: Endocrinology

## 2018-05-14 DIAGNOSIS — E1165 Type 2 diabetes mellitus with hyperglycemia: Secondary | ICD-10-CM | POA: Diagnosis not present

## 2018-05-14 DIAGNOSIS — Z794 Long term (current) use of insulin: Secondary | ICD-10-CM

## 2018-05-14 DIAGNOSIS — E782 Mixed hyperlipidemia: Secondary | ICD-10-CM

## 2018-05-14 MED ORDER — ROSUVASTATIN CALCIUM 40 MG PO TABS: 40.0000 mg | ORAL_TABLET | Freq: Every day | ORAL | 3 refills | Status: DC

## 2018-05-14 MED ORDER — EZETIMIBE 10 MG PO TABS: 10.0000 mg | ORAL_TABLET | Freq: Every day | ORAL | 3 refills | Status: DC

## 2018-05-14 MED ORDER — INSULIN LISPRO 200 UNIT/ML ~~LOC~~ SOPN: 80.0000 [IU] | PEN_INJECTOR | Freq: Two times a day (BID) | SUBCUTANEOUS | 1 refills | Status: DC

## 2018-05-14 MED ORDER — FREESTYLE LIBRE 14 DAY READER DEVI: 1.0000 | Freq: Once | 0 refills | Status: AC

## 2018-05-14 MED ORDER — FREESTYLE LIBRE 14 DAY SENSOR MISC: 1.0000 [IU] | 4 refills | Status: DC

## 2018-05-14 NOTE — Progress Notes (Addendum)
Patient ID: Amy Clarke, female   DOB: [birth year], 48 y.o.   MRN: [id number]           Reason for Appointment:  Follow-up for Type 2 Diabetes  Referring physician: Mayra Neer   Today's office visit was provided via telemedicine using video technique Explained to the patient and the the limitations of evaluation and management by telemedicine and the availability of in person appointments.  The patient understood the limitations and agreed to proceed. Patient also understood that the telehealth visit is billable. . Location of the patient: Home . Location of the provider: Office Only the patient and myself were participating in the encounter    History of Present Illness:          Date of diagnosis of type 2 diabetes mellitus: 2003?         Background history:   She had gestational diabetes in 1992 and subsequently was on diet alone She thinks she was started on diabetes medication about 15 years ago and probably took metformin which she took until about a year ago and this was stopped because of diarrhea.  Records show that she was taking 1000 mg of regular metformin twice a day With her PCP she has been on numerous diabetes medications over the years including Byetta, Trulicity, Jardiance and Januvia which apparently all caused side effects, mostly nausea or yeast infections with Vania Rea and Farxiga Her A1c has been mostly higher this year, last year has been as low as 7.2, in January her A1c was 8.4  Recent history:   INSULIN regimen is:  [place] U-200, 116 units in the morning.  Humalog 80 units twice daily  Non-insulin hypoglycemic drugs the patient is taking are: Synjardy XR 25/1000 in am  Her A1c last was 10.3 in August and is now 9.7  Current management, blood sugar patterns and problems identified:  She appeared to have better blood sugars with starting Synjardy previously but her A1c is not much better now compared to last year  Also again is irregular with  her follow-up  Previously she was having more postprandial hyperglycemia and was not taking her Humulin R U-500 before meals consistently she was switched to [place]  This was not covered and she is now taking Humalog  Even though she thinks she is taking Humalog regularly her blood sugars at home are fluctuating significantly both morning and evening  Also blood sugar TESTING is being done sporadically recently and last month was doing readings mostly fasting and before dinner  She was using the freestyle libre until about 7 to 10 days ago but was using her old 10-day sensor  POSTPRANDIAL readings are difficult to assess with lack of adequate monitoring but 3 out of her 4 readings at bedtime are over 200  Her last prescription of Humalog was only for 1 box with 1 refills but she still thinks she has not been induced of her prescription despite stating that she is taking up to 80 units twice a day  She thinks she has been eating irregularly with only a planned meal in the evening, snacks during the day and mostly just 2 cups of coffee in the morning  She thinks she is taking her Synjardy regularly in the morning  FASTING blood sugars are relatively better but still has a few readings over 200, lowest reading 83  No reported hypoglycemia       Side effects from medications have been: Diarhea with regular metformin   Glucose  monitoring:  done  1-2 times a day         Glucometer: One Touch ultra      Blood Glucose readings by review her monitor download   PRE-MEAL Fasting Lunch Dinner Bedtime Overall  Glucose range:  83-240  140-290  135-332  165-256   Mean/median:     196   PREVIOUS readings:  PRE-MEAL Fasting Lunch Dinner  overnight Overall  Glucose range:  67-179   63-140  50-174  50-270  Mean/median:  109  134  95   120+/-49   POST-MEAL PC Breakfast PC Lunch PC Dinner  Glucose range:    53-270  Mean/median:    150     Self-care: The diet that the patient has been  following is: tries to limit high-fat foods and drinks with sugar .     Typical meal intake: Breakfast is sometimes cereal/bagel/granola Bar.  Usually eating breakfast only if she is working  [employer] usually yogurt, cheese, fruit and crackers.  Dinner chicken with vegetables.  She will have snacks with granola bar or popcorn                Dietician visit, most recent: Several years ago in class               Exercise: Occasional walking  Weight history:  Wt Readings from Last 3 Encounters:  11/06/17 164 lb 1.9 oz (74.4 kg)  10/31/17 162 lb (73.5 kg)  10/29/17 164 lb 12.8 oz (74.8 kg)    Glycemic control:   Lab Results  Component Value Date   HGBA1C 9.7 (H) 05/08/2018   HGBA1C 10.3 (A) 09/11/2017   HGBA1C 8.0 (H) 02/08/2017   Lab Results  Component Value Date   MICROALBUR 1.1 09/07/2016   CREATININE 0.64 05/08/2018   Lab Results  Component Value Date   MICRALBCREAT 1.2 09/07/2016    Lab Results  Component Value Date   FRUCTOSAMINE 372 (H) 09/10/2017   FRUCTOSAMINE 216 11/16/2016      Allergies as of 05/14/2018      Reactions   Farxiga [dapagliflozin] Other (See Comments)   YEAST INFECTION   Januvia [sitagliptin] Diarrhea, Nausea And Vomiting   Lisinopril Cough   Tanzeum [albiglutide] Nausea And Vomiting   Trulicity [dulaglutide] Nausea Only   Sulfonamide Derivatives Rash      Medication List       Accurate as of May 14, 2018  1:45 PM. Always use your most recent med list.        Albuterol Sulfate 108 (90 Base) MCG/ACT Aepb Inhale 2 puffs into the lungs as needed (shortness of breath).   atorvastatin 80 MG tablet Commonly known as:  LIPITOR Take 80 mg by mouth every evening.   budesonide-formoterol 160-4.5 MCG/ACT inhaler Commonly known as:  SYMBICORT Inhale 2 puffs into the lungs 2 (two) times daily as needed (asthma).   buPROPion 150 MG 24 hr tablet Commonly known as:  WELLBUTRIN XL Take 150 mg by mouth daily.   ezetimibe 10 MG tablet  Commonly known as:  Zetia Take 1 tablet (10 mg total) by mouth daily.   fluticasone 50 MCG/ACT nasal spray Commonly known as:  FLONASE SPRAY 2 SPRAYS INTO EACH NOSTRIL EVERY DAY   glucose blood test strip Commonly known as:  ONE TOUCH ULTRA TEST Use as instructed to test 3 times daily   insulin lispro 100 UNIT/ML KwikPen Commonly known as:  HumaLOG KwikPen Inject 20 units before each meal based on meal size  Magnesium 250 MG Tabs Take 250 mg by mouth daily.   metoprolol tartrate 25 MG tablet Commonly known as:  LOPRESSOR Take 25 mg by mouth 2 (two) times daily.   metoprolol tartrate 25 MG tablet Commonly known as:  LOPRESSOR Take 1 tablet (25 mg total) by mouth 2 (two) times daily.   nitroGLYCERIN 0.4 MG SL tablet Commonly known as:  NITROSTAT Place 1 tablet (0.4 mg total) under the tongue every 5 (five) minutes as needed for chest pain.   omeprazole 20 MG capsule Commonly known as:  PRILOSEC Take 20 mg by mouth 2 (two) times daily before a meal.   Synjardy XR 25-1000 MG Tb24 Generic drug:  Empagliflozin-metFORMIN HCl ER TAKE 1 TABLET BY MOUTH EVERY DAY   Tresiba FlexTouch 200 UNIT/ML Sopn Generic drug:  Insulin Degludec INJECT 116 UNITS INTO THE SKIN DAILY.   valsartan 320 MG tablet Commonly known as:  DIOVAN Take 320 mg by mouth daily.   venlafaxine XR 150 MG 24 hr capsule Commonly known as:  EFFEXOR-XR Take 150 mg by mouth daily with breakfast.   vitamin C 500 MG tablet Commonly known as:  ASCORBIC ACID Take 500 mg by mouth daily.   Vitamin D (Cholecalciferol) 25 MCG (1000 UT) Caps Take 1,000 Units by mouth daily.       Allergies:  Allergies  Allergen Reactions  . [place] [Dapagliflozin] Other (See Comments)    YEAST INFECTION   . Januvia [Sitagliptin] Diarrhea and Nausea And Vomiting  . Lisinopril Cough  . Tanzeum [Albiglutide] Nausea And Vomiting  . Trulicity [Dulaglutide] Nausea Only  . Sulfonamide Derivatives Rash    Past Medical  History:  Diagnosis Date  . Asthma   . Depression   . Diabetes ([place])   . GERD (gastroesophageal reflux disease)   . Hyperlipidemia   . Hypertension   . Kidney cysts   . Kidney stones   . Migraine headache     Past Surgical History:  Procedure Laterality Date  . LEFT HEART CATH AND CORONARY ANGIOGRAPHY N/A 10/29/2017   Procedure: LEFT HEART CATH AND CORONARY ANGIOGRAPHY;  Surgeon: Martinique, Peter M, MD;  Location: [facility];  Service: Cardiovascular;  Laterality: N/A;  . none      Family History  Problem Relation Age of Onset  . Diabetes Mother   . Diabetes Maternal Grandmother   . Thyroid disease Cousin     Social History:  reports that she has never smoked. She has never used smokeless tobacco. She reports that she does not drink alcohol or use drugs.   Review of Systems   Lipid history: Currently on Lipitor 80 mg and was prescribed Zetia Also she was supposed to switch to Crestor but does not have this on her list  LDL is still well over 100 and triglycerides are 451 although were 611 in February with PCP    Lab Results  Component Value Date   CHOL 210 (H) 05/08/2018   HDL 33.20 (L) 05/08/2018   LDLDIRECT 125.0 05/08/2018   TRIG (H) 05/08/2018    451.0 Triglyceride is over 400; calculations on Lipids are invalid.   CHOLHDL 6 05/08/2018           Hypertension: Blood pressure has been high since her 50s, managed by PCP with valsartan, last BP 130/88  Most recent eye exam was In 04/2016  Most recent foot exam: 03/2018    Physical Examination:  There were no vitals taken for this visit.  ASSESSMENT:  Diabetes type 2, uncontrolled with BMI 33  See history of present illness for detailed discussion of current diabetes management, blood sugar patterns and problems identified  Her A1c is now 9.7, previously was 10.3  She is not monitoring blood sugars enough and likely has a lot of postprandial hyperglycemia causing her A1c to be consistently  high As before she is insulin resistant Only benefiting somewhat from using Synjardy currently She can likely do better if she is more consistent with diet and exercise regimen Also since she has not refilled her Humalog since last November likely is not taking the prescribed dose despite stating that she is taking 80 units with her meals She may do better with an insulin pump with better control and compliance and less insulin requirement and this was discussed   HYPERTENSION: Blood pressure is going to be followed by PCP    LIPIDS: LDL significantly high and about the same; today she cannot be sure whether she is taking the Zetia that was prescribed in addition to her statin on the last visit Her lipids should be better controlled because of her improved blood sugar control but she does need more weight loss for improved triglycerides  PLAN:    She will start using the freestyle libre sensor and the new prescription for the 14-day sensor and reader will be sent  Discussed needing to check blood sugars very consistently and importance of doing more readings after meals which she is not currently doing  She will also need to take her Humalog consistently which she is unlikely to be doing  Does need to try and improve her diet with avoiding junk food and snacks as well as having more balanced meals  Take Humalog consistently before breakfast and most likely will need to take 90 to 100 units at dinnertime instead of 80 if she confirmed that her readings are mostly high after supper  No change in [place]  She will be sent information on the Omni pod insulin pump which should be able to improve her level of control and compliance with mealtime insulin  Continue Synjardy in the morning  More regular follow-up, needs to be seen back in follow-up in 2 months  She needs to start back on Zetia for which she did not have a refill  Also will need to switch her to Crestor again since LDL may  be better with that compared to Lipitor     There are no Patient Instructions on file for this visit.    Counseling time on subjects discussed in assessment and plan sections is over 50% of today's 25 minute visit     Elayne Snare 05/14/2018, 1:45 PM   Note: This office note was prepared with Dragon voice recognition system technology. Any transcriptional errors that result from this process are unintentional.

## 2018-05-20 DIAGNOSIS — S40261A Insect bite (nonvenomous) of right shoulder, initial encounter: Secondary | ICD-10-CM | POA: Diagnosis not present

## 2018-05-27 ENCOUNTER — Other Ambulatory Visit: Payer: Self-pay | Admitting: Endocrinology

## 2018-07-17 ENCOUNTER — Other Ambulatory Visit: Payer: Self-pay | Admitting: Endocrinology

## 2018-07-18 ENCOUNTER — Other Ambulatory Visit (INDEPENDENT_AMBULATORY_CARE_PROVIDER_SITE_OTHER): Payer: 59

## 2018-07-18 ENCOUNTER — Other Ambulatory Visit: Payer: Self-pay

## 2018-07-18 DIAGNOSIS — Z794 Long term (current) use of insulin: Secondary | ICD-10-CM | POA: Diagnosis not present

## 2018-07-18 DIAGNOSIS — E1165 Type 2 diabetes mellitus with hyperglycemia: Secondary | ICD-10-CM

## 2018-07-18 DIAGNOSIS — E782 Mixed hyperlipidemia: Secondary | ICD-10-CM | POA: Diagnosis not present

## 2018-07-18 LAB — BASIC METABOLIC PANEL
BUN: 12 mg/dL (ref 6–23)
CO2: 29 mEq/L (ref 19–32)
Calcium: 9 mg/dL (ref 8.4–10.5)
Chloride: 101 mEq/L (ref 96–112)
Creatinine, Ser: 0.54 mg/dL (ref 0.40–1.20)
GFR: 119.83 mL/min (ref >60.00)
Glucose, Bld: 183 mg/dL — ABNORMAL HIGH (ref 70–99)
Potassium: 4 mEq/L (ref 3.5–5.1)
Sodium: 137 mEq/L (ref 135–145)

## 2018-07-18 LAB — LIPID PANEL
Cholesterol: 133 mg/dL (ref 0–200)
HDL: 33.9 mg/dL — ABNORMAL LOW (ref >39.00)
NonHDL: 99.41
Total CHOL/HDL Ratio: 4
Triglycerides: 286 mg/dL — ABNORMAL HIGH (ref 0.0–149.0)
VLDL: 57.2 mg/dL — ABNORMAL HIGH (ref 0.0–40.0)

## 2018-07-18 LAB — LDL CHOLESTEROL, DIRECT: Direct LDL: 79 mg/dL

## 2018-07-19 LAB — FRUCTOSAMINE: Fructosamine: 265 umol/L (ref 0–285)

## 2018-07-23 ENCOUNTER — Encounter: Payer: Self-pay | Admitting: Endocrinology

## 2018-07-23 ENCOUNTER — Ambulatory Visit: Payer: 59 | Admitting: Endocrinology

## 2018-07-23 ENCOUNTER — Other Ambulatory Visit: Payer: Self-pay

## 2018-07-23 VITALS — BP 120/62 | HR 94 | Temp 98.3°F | Ht 59.0 in | Wt 176.4 lb

## 2018-07-23 DIAGNOSIS — I10 Essential (primary) hypertension: Secondary | ICD-10-CM | POA: Diagnosis not present

## 2018-07-23 DIAGNOSIS — E1165 Type 2 diabetes mellitus with hyperglycemia: Secondary | ICD-10-CM | POA: Diagnosis not present

## 2018-07-23 DIAGNOSIS — Z794 Long term (current) use of insulin: Secondary | ICD-10-CM

## 2018-07-23 DIAGNOSIS — E782 Mixed hyperlipidemia: Secondary | ICD-10-CM

## 2018-07-23 MED ORDER — VICTOZA 18 MG/3ML ~~LOC~~ SOPN: 1.2000 mg | PEN_INJECTOR | Freq: Every day | SUBCUTANEOUS | 3 refills | Status: DC

## 2018-07-23 NOTE — Progress Notes (Signed)
Patient ID: Amy Clarke, female   DOB: April 19, 1970, 48 y.o.   MRN: [id number]           Reason for Appointment:  Follow-up for Type 2 Diabetes  Referring physician: Mayra Neer   History of Present Illness:          Date of diagnosis of type 2 diabetes mellitus: 2003?         Background history:   She had gestational diabetes in 1992 and subsequently was on diet alone She thinks she was started on diabetes medication about 15 years ago and probably took metformin which she took until about a year ago and this was stopped because of diarrhea.  Records show that she was taking 1000 mg of regular metformin twice a day With her PCP she has been on numerous diabetes medications over the years including Byetta, Trulicity, Jardiance and Januvia which apparently all caused side effects, mostly nausea or yeast infections with Vania Rea and Farxiga Her A1c has been mostly higher this year, last year has been as low as 7.2, in January her A1c was 8.4  Recent history:   INSULIN regimen is:  [place] U-200, 116 units in the morning.  Humalog 80 units twice daily  Non-insulin hypoglycemic drugs the patient is taking are: Synjardy XR 25/1000 in am  Her A1c last was 9.7 Fructosamine is 265, recent MEAN blood sugar on her fingerstick readings is 206  Current management, blood sugar patterns and problems identified:  She has been using the freestyle libre system recently but unable to link her account and she is using her phone to upload her data  On an average her blood sugars are looking higher than before on her meter  She appears to have fairly consistently high FASTING blood sugars despite taking 116 units of Tresiba  She also thinks that in the evening her blood sugars are higher because she has an excessive amount of hunger later in the evening after dinner  She has gained 12 pounds since her last weight check in the office  She is currently not exercising  Postprandial blood  sugars are not usually higher but not consistently checked also  It appears that she will also frequently check her blood sugars after the meal and take additional 30-40 Humalog to bring the blood sugar down and this has always cause hypoglycemia with blood sugar of 61  Last night she took extra insulin at 1:30 AM and her blood sugar was down to 114 this morning and she rebounded up to 326  She is taking Synjardy  In the past she had taken Trulicity and Byetta and she is not clear what kind of side effects she had with them        Side effects from medications have been: Diarhea with regular metformin   Glucose monitoring:  done  1-2 times a day         Glucometer: One Touch ultra 2 and freestyle libre     Blood Glucose readings by review her monitor download   PRE-MEAL Fasting Lunch Dinner  7-11 PM Overall  Glucose range:    61-293  103-356  61-356  Mean/median:  246  170  186  242  214   Previous readings:  PRE-MEAL Fasting Lunch Dinner Bedtime Overall  Glucose range:  83-240  140-290  135-332  165-256   Mean/median:     196     Self-care: The diet that the patient has been following is: tries to  limit high-fat foods and drinks with sugar .     Typical meal intake: Breakfast is sometimes cereal/bagel/granola Bar.  Usually eating breakfast only if she is working  [employer] usually yogurt, cheese, fruit and crackers.  Dinner chicken with vegetables.  She will have snacks with granola bar or popcorn                Dietician visit, most recent: Several years ago in class               Exercise: not walking  Weight history:  Wt Readings from Last 3 Encounters:  07/23/18 176 lb 6.4 oz (80 kg)  11/06/17 164 lb 1.9 oz (74.4 kg)  10/31/17 162 lb (73.5 kg)    Glycemic control:   Lab Results  Component Value Date   HGBA1C 9.7 (H) 05/08/2018   HGBA1C 10.3 (A) 09/11/2017   HGBA1C 8.0 (H) 02/08/2017   Lab Results  Component Value Date   MICROALBUR 1.1 09/07/2016    CREATININE 0.54 07/18/2018   Lab Results  Component Value Date   MICRALBCREAT 1.2 09/07/2016    Lab Results  Component Value Date   FRUCTOSAMINE 265 07/18/2018   FRUCTOSAMINE 372 (H) 09/10/2017   FRUCTOSAMINE 216 11/16/2016      Allergies as of 07/23/2018      Reactions   Farxiga [dapagliflozin] Other (See Comments)   YEAST INFECTION   Januvia [sitagliptin] Diarrhea, Nausea And Vomiting   Lisinopril Cough   Tanzeum [albiglutide] Nausea And Vomiting   Trulicity [dulaglutide] Nausea Only   Sulfonamide Derivatives Rash      Medication List       Accurate as of July 23, 2018  2:38 PM. If you have any questions, ask your nurse or doctor.        Albuterol Sulfate 108 (90 Base) MCG/ACT Aepb Inhale 2 puffs into the lungs as needed (shortness of breath).   atorvastatin 80 MG tablet Commonly known as: LIPITOR Take 80 mg by mouth every evening.   budesonide-formoterol 160-4.5 MCG/ACT inhaler Commonly known as: SYMBICORT Inhale 2 puffs into the lungs 2 (two) times daily as needed (asthma).   buPROPion 150 MG 24 hr tablet Commonly known as: WELLBUTRIN XL Take 150 mg by mouth daily.   ezetimibe 10 MG tablet Commonly known as: Zetia Take 1 tablet (10 mg total) by mouth daily.   fluticasone 50 MCG/ACT nasal spray Commonly known as: FLONASE SPRAY 2 SPRAYS INTO EACH NOSTRIL EVERY DAY   FreeStyle Libre 14 Day Sensor Misc 1 Units by Does not apply route every 14 (fourteen) days.   glucose blood test strip Commonly known as: ONE TOUCH ULTRA TEST Use as instructed to test 3 times daily   Insulin Lispro 200 UNIT/ML Sopn Commonly known as: HumaLOG KwikPen Inject 80 Units into the skin 2 (two) times daily before a meal.   insulin lispro 100 UNIT/ML KwikPen Commonly known as: HumaLOG KwikPen INJECT 20 UNITS BEFORE EACH MEAL BASED ON MEAL SIZE   Magnesium 250 MG Tabs Take 250 mg by mouth daily.   metoprolol tartrate 25 MG tablet Commonly known as: LOPRESSOR Take 25 mg  by mouth 2 (two) times daily.   metoprolol tartrate 25 MG tablet Commonly known as: LOPRESSOR Take 1 tablet (25 mg total) by mouth 2 (two) times daily.   nitroGLYCERIN 0.4 MG SL tablet Commonly known as: NITROSTAT Place 1 tablet (0.4 mg total) under the tongue every 5 (five) minutes as needed for chest pain.   omeprazole 20 MG  capsule Commonly known as: PRILOSEC Take 20 mg by mouth 2 (two) times daily before a meal.   rosuvastatin 40 MG tablet Commonly known as: Crestor Take 1 tablet (40 mg total) by mouth daily.   Synjardy XR 25-1000 MG Tb24 Generic drug: Empagliflozin-metFORMIN HCl ER TAKE 1 TABLET BY MOUTH EVERY DAY   Tresiba FlexTouch 200 UNIT/ML Sopn Generic drug: Insulin Degludec INJECT 116 UNITS INTO THE SKIN DAILY.   valsartan 320 MG tablet Commonly known as: DIOVAN Take 320 mg by mouth daily.   venlafaxine XR 150 MG 24 hr capsule Commonly known as: EFFEXOR-XR Take 150 mg by mouth daily with breakfast.   Victoza 18 MG/3ML Sopn Generic drug: liraglutide Inject 0.2 mLs (1.2 mg total) into the skin daily. Inject once daily at the same time Started by: Elayne Snare, MD   vitamin C 500 MG tablet Commonly known as: ASCORBIC ACID Take 500 mg by mouth daily.   Vitamin D (Cholecalciferol) 25 MCG (1000 UT) Caps Take 1,000 Units by mouth daily.       Allergies:  Allergies  Allergen Reactions  . Wilder Glade [Dapagliflozin] Other (See Comments)    YEAST INFECTION   . Januvia [Sitagliptin] Diarrhea and Nausea And Vomiting  . Lisinopril Cough  . Tanzeum [Albiglutide] Nausea And Vomiting  . Trulicity [Dulaglutide] Nausea Only  . Sulfonamide Derivatives Rash    Past Medical History:  Diagnosis Date  . Asthma   . Depression   . Diabetes ([place])   . GERD (gastroesophageal reflux disease)   . Hyperlipidemia   . Hypertension   . Kidney cysts   . Kidney stones   . Migraine headache     Past Surgical History:  Procedure Laterality Date  . LEFT HEART CATH AND  CORONARY ANGIOGRAPHY N/A 10/29/2017   Procedure: LEFT HEART CATH AND CORONARY ANGIOGRAPHY;  Surgeon: Martinique, Peter M, MD;  Location: [facility];  Service: Cardiovascular;  Laterality: N/A;  . none      Family History  Problem Relation Age of Onset  . Diabetes Mother   . Diabetes Maternal Grandmother   . Thyroid disease Cousin     Social History:  reports that she has never smoked. She has never used smokeless tobacco. She reports that she does not drink alcohol or use drugs.   Review of Systems   Lipid history:  She has mixed hyperlipidemia She has been switched to Crestor 40 mg daily because of persistently high LDL Also taking Zetia  Her LDL is below 100 Triglycerides are improving at 286 compared to 451    Lab Results  Component Value Date   CHOL 133 07/18/2018   CHOL 210 (H) 05/08/2018   CHOL 215 (H) 10/28/2017   Lab Results  Component Value Date   HDL 33.90 (L) 07/18/2018   HDL 33.20 (L) 05/08/2018   HDL 29.60 (L) 10/28/2017   No results found for: [facility] Lab Results  Component Value Date   TRIG 286.0 (H) 07/18/2018   TRIG (H) 05/08/2018    451.0 Triglyceride is over 400; calculations on Lipids are invalid.   TRIG 265.0 (H) 10/28/2017   Lab Results  Component Value Date   CHOLHDL 4 07/18/2018   CHOLHDL 6 05/08/2018   CHOLHDL 7 10/28/2017   Lab Results  Component Value Date   LDLDIRECT 79.0 07/18/2018   LDLDIRECT 125.0 05/08/2018   LDLDIRECT 154.0 10/28/2017            Hypertension: Blood pressure has been high since her 20s, managed  by PCP with valsartan  Most recent eye exam was In 04/2016  Most recent foot exam: 03/2018    Physical Examination:  BP 120/62 (BP Location: Left Arm, Patient Position: Sitting, Cuff Size: Normal)   Pulse 94   Temp 98.3 F (36.8 C) (Oral)   Ht 4\' 11"  (1.499 m)   Wt 176 lb 6.4 oz (80 kg)   SpO2 97%   BMI 35.63 kg/m       ASSESSMENT:  Diabetes type 2, uncontrolled with BMI 33  See history of  present illness for detailed discussion of current diabetes management, blood sugar patterns and problems identified  Her A1c is last 9.7, fructosamine 265  Her blood sugar control is still inadequate with significant amount of insulin resistance She is interested in insulin pumps but cannot afford the cost  It appears that she likely needs much more basal insulin because of her fasting readings being consistently high even with time 116 units of Tresiba Blood sugars do not improve with taking Humalog and sometimes they do come down to low normal levels when she takes Humalog in between meals or late at night She is also having difficulty with excessive snacking and carbohydrate intake after supper and evening Weight has gone up She is not exercising    HYPERTENSION: Blood pressure is excellent  LIPIDS: Much better controlled now with improved LDL and triglycerides with Crestor and Zetia She may also need fenofibrate but triglycerides should improve with weight loss and better diabetes control  PLAN:    She will continue using the freestyle libre sensor but also need to link her account to our website account and she will check on her email to allow this connection  She agrees to a trial of the GLP-1 drug to help her with control as well as improve her satiety and prevent excessive snacking at night  For convenience and ease of titration she will use Victoza 0.6 mg for the first week and then increase to 1.2.  Also showed her how to use an intermediate dose if needed to control nausea and she will continue to titrate the dose upwards  Tresiba 130 units daily and she will increase the dose by 10 units every 3 days until her morning sugar is below 140 at least  She will not take Humalog postprandially but adjust the dose before meals based on the blood sugar level and what she is eating  Walk daily for exercise  Counseling time on subjects discussed in assessment and plan sections is  over 50% of today's 25 minute visit      Patient Instructions  Start VICTOZA injection as shown once daily at the same time of the day.  Dial the dose to 0.6 mg on the pen for the first week.   You may inject in the stomach, thigh or arm. You may experience nausea in the first few days which usually goes away.   You will feel fullness of the stomach with starting the medication and should try to keep the portions at meals small.  After 1 week increase the dose to 1.2mg  daily if no nausea present.   If any questions or concerns are present call the office or the [place] helpline at [phone]. Visit [email or web address].[email or web address] for more useful information  Exercise daily           Elayne Snare 07/23/2018, 2:38 PM   Note: This office note was prepared with Dragon voice recognition system technology. Any transcriptional errors that result  from this process are unintentional.

## 2018-07-23 NOTE — Patient Instructions (Signed)
Start VICTOZA injection as shown once daily at the same time of the day.  Dial the dose to 0.6 mg on the pen for the first week.   You may inject in the stomach, thigh or arm. You may experience nausea in the first few days which usually goes away.   You will feel fullness of the stomach with starting the medication and should try to keep the portions at meals small.  After 1 week increase the dose to 1.2mg  daily if no nausea present.   If any questions or concerns are present call the office or the [place] helpline at [phone]. Visit [email or web address] for more useful information  Exercise daily

## 2018-07-24 ENCOUNTER — Encounter: Payer: Self-pay | Admitting: Endocrinology

## 2018-07-24 ENCOUNTER — Other Ambulatory Visit: Payer: Self-pay | Admitting: Endocrinology

## 2018-08-29 ENCOUNTER — Other Ambulatory Visit: Payer: Self-pay

## 2018-08-29 ENCOUNTER — Other Ambulatory Visit (INDEPENDENT_AMBULATORY_CARE_PROVIDER_SITE_OTHER): Payer: 59

## 2018-08-29 DIAGNOSIS — E1165 Type 2 diabetes mellitus with hyperglycemia: Secondary | ICD-10-CM | POA: Diagnosis not present

## 2018-08-29 DIAGNOSIS — Z794 Long term (current) use of insulin: Secondary | ICD-10-CM | POA: Diagnosis not present

## 2018-08-29 LAB — BASIC METABOLIC PANEL
BUN: 14 mg/dL (ref 6–23)
CO2: 28 mEq/L (ref 19–32)
Calcium: 9.6 mg/dL (ref 8.4–10.5)
Chloride: 104 mEq/L (ref 96–112)
Creatinine, Ser: 0.73 mg/dL (ref 0.40–1.20)
GFR: 84.58 mL/min (ref >60.00)
Glucose, Bld: 181 mg/dL — ABNORMAL HIGH (ref 70–99)
Potassium: 4.3 mEq/L (ref 3.5–5.1)
Sodium: 139 mEq/L (ref 135–145)

## 2018-08-29 LAB — HEMOGLOBIN A1C: Hgb A1c MFr Bld: 8.1 % — ABNORMAL HIGH (ref 4.6–6.5)

## 2018-08-29 MED ORDER — TRESIBA FLEXTOUCH 200 UNIT/ML ~~LOC~~ SOPN: 130.0000 [IU] | PEN_INJECTOR | Freq: Every day | SUBCUTANEOUS | 3 refills | Status: DC

## 2018-09-01 ENCOUNTER — Telehealth: Payer: Self-pay

## 2018-09-01 NOTE — Telephone Encounter (Signed)
PA initiated via [email or web address] for Tresiba U200 130 units SQ QD. Quillian Quince Key: [id number] - PA Case ID: [id number] Need help? Call us at [phone] Status Sent to [facility] FlexTouch (insulin degludec injection) 200 Units/mL solution Form [employer] Electronic Prior Authorization Form (2017 NCPDP)

## 2018-09-01 NOTE — Telephone Encounter (Signed)
Will need a PA, using very high doses of insulin and changing will be risky for her diabetes.  Failed Lantus in 2019

## 2018-09-01 NOTE — Telephone Encounter (Signed)
Received fax from pt's pharmacy stating that Amy Clarke requires a PA. Would you like to do PA or change medication?

## 2018-09-02 NOTE — Telephone Encounter (Signed)
Noted  

## 2018-09-02 NOTE — Telephone Encounter (Signed)
She will take Toujeo and we can send the prescription tomorrow when she comes in

## 2018-09-02 NOTE — Telephone Encounter (Signed)
Received fax from [employer] stating that he patient has been denied for coverage for Tresiba Flextouch U200. Reason for denial is because patient must first fail Lantus, and Toujeo. Pt has only tried lantus. How would you like to proceed from this?

## 2018-09-03 ENCOUNTER — Other Ambulatory Visit: Payer: Self-pay

## 2018-09-03 ENCOUNTER — Ambulatory Visit: Payer: 59 | Admitting: Endocrinology

## 2018-09-03 ENCOUNTER — Encounter: Payer: Self-pay | Admitting: Endocrinology

## 2018-09-03 VITALS — BP 94/62 | HR 98 | Ht 59.0 in | Wt 170.0 lb

## 2018-09-03 DIAGNOSIS — I952 Hypotension due to drugs: Secondary | ICD-10-CM | POA: Diagnosis not present

## 2018-09-03 DIAGNOSIS — Z794 Long term (current) use of insulin: Secondary | ICD-10-CM | POA: Diagnosis not present

## 2018-09-03 DIAGNOSIS — E1165 Type 2 diabetes mellitus with hyperglycemia: Secondary | ICD-10-CM | POA: Diagnosis not present

## 2018-09-03 MED ORDER — TOUJEO SOLOSTAR 300 UNIT/ML ~~LOC~~ SOPN: 120.0000 [IU] | PEN_INJECTOR | Freq: Every day | SUBCUTANEOUS | 3 refills | Status: DC

## 2018-09-03 MED ORDER — INSULIN PEN NEEDLE 31G X 5 MM MISC: 1 refills | Status: AC

## 2018-09-03 MED ORDER — ONDANSETRON HCL 4 MG PO TABS: 4.0000 mg | ORAL_TABLET | Freq: Three times a day (TID) | ORAL | 1 refills | Status: DC | PRN

## 2018-09-03 NOTE — Patient Instructions (Addendum)
Tresiba 110 units Skip Humalog in am Victoza 1.2mg  daily  Toujeo 10 units more than Tresiba  No Valsartan for now

## 2018-09-03 NOTE — Progress Notes (Signed)
Patient ID: Amy Clarke, female   DOB: October 09, 1970, 48 y.o.   MRN: [id number]           Reason for Appointment:  Follow-up for Type 2 Diabetes  Referring physician: Mayra Neer   History of Present Illness:          Date of diagnosis of type 2 diabetes mellitus: 2003?         Background history:   She had gestational diabetes in 1992 and subsequently was on diet alone She thinks she was started on diabetes medication about 15 years ago and probably took metformin which she took until about a year ago and this was stopped because of diarrhea.  Records show that she was taking 1000 mg of regular metformin twice a day With her PCP she has been on numerous diabetes medications over the years including Byetta, Trulicity, Jardiance and Januvia which apparently all caused side effects, mostly nausea or yeast infections with Vania Rea and Farxiga Her A1c has been mostly higher this year, last year has been as low as 7.2, in January her A1c was 8.4  Recent history:   INSULIN regimen is:  [place] U-200, 100 units in the morning.  Humalog 80 units twice daily  Non-insulin hypoglycemic drugs the patient is taking are: Synjardy XR 25/1000 in am, Victoza 1.8 mg daily  Her A1c last was 9.7 and is now 8.1  Current management, blood sugar patterns and problems identified:  She has been using Victoza since her last visit about 6 weeks ago  Previously had difficulty with weight gain, progressively increasing insulin requirement and continued insulin resistance  Her blood sugars which were previously averaging over 200 and as high as 356 are now significantly better  The freestyle libre system has not been used because she cannot keep the sensor sticking on her arm because of the heat and sweating  Although she is checking blood sugars nearly 3 times a day she is doing this mostly in the mornings and evenings  FASTING readings are mildly increased and started improving about 15 days ago  On  her own she reduced her [place] by 30 units even though she was recommended taking 130 units  She has increased her Victoza progressively and for the last 2 days is taking 1.8 mg  However she says that periodically she will have nausea and malaise and a morning sickness feeling with this especially when increasing the dose and this has been present even with the 1.2 mg doses  Also occasionally she will forget the dose because she tries to take it at bedtime and the next day sugars are higher  POSTPRANDIAL readings are excellent now with only occasional readings over 200 at night  She has not reduce her HUMALOG in the morning even though she said that she has very little appetite at breakfast and lunch  With this she is getting HYPOGLYCEMIA sporadically midday and afternoon with blood sugars as low as 53  Her weight has come down 6 pounds  Recently she is trying to do a little walking        Side effects from medications have been: Diarhea with regular metformin   Glucose monitoring:  done  1-2 times a day         Glucometer: One Touch ultra 2 and freestyle libre     Blood Glucose readings by review her monitor download   PRE-MEAL Fasting Lunch Dinner Bedtime Overall  Glucose range:  99-241     53-283  Mean/median:  156  127  124   144+/-53   POST-MEAL PC Breakfast PC Lunch PC Dinner  Glucose range:    67-283  Mean/median:      Previous readings:  PRE-MEAL Fasting Lunch Dinner  7-11 PM Overall  Glucose range:    61-293  103-356  61-356  Mean/median:  246  170  186  242  214      Self-care: The diet that the patient has been following is: tries to limit high-fat foods and drinks with sugar .     Typical meal intake: Breakfast is sometimes cereal/bagel/granola Bar.  Usually eating breakfast only if she is working  [employer] usually yogurt, cheese, fruit and crackers.  Dinner chicken with vegetables.  She will have snacks with granola bar or popcorn                Dietician  visit, most recent: Several years ago in class                 Weight history:  Wt Readings from Last 3 Encounters:  09/03/18 170 lb (77.1 kg)  07/23/18 176 lb 6.4 oz (80 kg)  11/06/17 164 lb 1.9 oz (74.4 kg)    Glycemic control:   Lab Results  Component Value Date   HGBA1C 8.1 (H) 08/29/2018   HGBA1C 9.7 (H) 05/08/2018   HGBA1C 10.3 (A) 09/11/2017   Lab Results  Component Value Date   MICROALBUR 1.1 09/07/2016   CREATININE 0.73 08/29/2018   Lab Results  Component Value Date   MICRALBCREAT 1.2 09/07/2016    Lab Results  Component Value Date   FRUCTOSAMINE 265 07/18/2018   FRUCTOSAMINE 372 (H) 09/10/2017   FRUCTOSAMINE 216 11/16/2016      Allergies as of 09/03/2018      Reactions   Farxiga [dapagliflozin] Other (See Comments)   YEAST INFECTION   Januvia [sitagliptin] Diarrhea, Nausea And Vomiting   Lisinopril Cough   Tanzeum [albiglutide] Nausea And Vomiting   Trulicity [dulaglutide] Nausea Only   Sulfonamide Derivatives Rash      Medication List       Accurate as of September 03, 2018  4:19 PM. If you have any questions, ask your nurse or doctor.        STOP taking these medications   FreeStyle Libre 14 Day Sensor Misc Stopped by: Elayne Snare, MD   nitroGLYCERIN 0.4 MG SL tablet Commonly known as: NITROSTAT Stopped by: Elayne Snare, MD   Tyler Aas FlexTouch 200 UNIT/ML Sopn Generic drug: Insulin Degludec Stopped by: Elayne Snare, MD     TAKE these medications   Albuterol Sulfate 108 (90 Base) MCG/ACT Aepb Inhale 2 puffs into the lungs as needed (shortness of breath).   atorvastatin 80 MG tablet Commonly known as: LIPITOR Take 80 mg by mouth every evening.   budesonide-formoterol 160-4.5 MCG/ACT inhaler Commonly known as: SYMBICORT Inhale 2 puffs into the lungs 2 (two) times daily as needed (asthma).   buPROPion 150 MG 24 hr tablet Commonly known as: WELLBUTRIN XL Take 150 mg by mouth daily.   ezetimibe 10 MG tablet Commonly known as: Zetia  Take 1 tablet (10 mg total) by mouth daily.   fluticasone 50 MCG/ACT nasal spray Commonly known as: FLONASE SPRAY 2 SPRAYS INTO EACH NOSTRIL EVERY DAY   glucose blood test strip Commonly known as: ONE TOUCH ULTRA TEST Use as instructed to test 3 times daily   Insulin Lispro 200 UNIT/ML Sopn Commonly known as: HumaLOG KwikPen Inject 80 Units  into the skin 2 (two) times daily before a meal. What changed: Another medication with the same name was removed. Continue taking this medication, and follow the directions you see here. Changed by: Elayne Snare, MD   Insulin Pen Needle 31G X 5 MM Misc Use for insulin and Victoza Started by: Elayne Snare, MD   Magnesium 250 MG Tabs Take 250 mg by mouth daily.   metoprolol tartrate 25 MG tablet Commonly known as: LOPRESSOR Take 25 mg by mouth 2 (two) times daily.   metoprolol tartrate 25 MG tablet Commonly known as: LOPRESSOR Take 1 tablet (25 mg total) by mouth 2 (two) times daily.   omeprazole 20 MG capsule Commonly known as: PRILOSEC Take 20 mg by mouth 2 (two) times daily before a meal.   ondansetron 4 MG tablet Commonly known as: Zofran Take 1 tablet (4 mg total) by mouth every 8 (eight) hours as needed for nausea or vomiting. Started by: Elayne Snare, MD   rosuvastatin 40 MG tablet Commonly known as: Crestor Take 1 tablet (40 mg total) by mouth daily.   Synjardy XR 25-1000 MG Tb24 Generic drug: Empagliflozin-metFORMIN HCl ER TAKE 1 TABLET BY MOUTH EVERY DAY   Toujeo SoloStar 300 UNIT/ML Sopn Generic drug: Insulin Glargine (1 Unit Dial) Inject 120 Units into the skin daily. Started by: Elayne Snare, MD   valsartan 320 MG tablet Commonly known as: DIOVAN Take 320 mg by mouth daily.   venlafaxine XR 150 MG 24 hr capsule Commonly known as: EFFEXOR-XR Take 150 mg by mouth daily with breakfast.   Victoza 18 MG/3ML Sopn Generic drug: liraglutide Inject 0.2 mLs (1.2 mg total) into the skin daily. Inject once daily at the same  time What changed: how much to take   vitamin C 500 MG tablet Commonly known as: ASCORBIC ACID Take 500 mg by mouth daily.   Vitamin D (Cholecalciferol) 25 MCG (1000 UT) Caps Take 1,000 Units by mouth daily.       Allergies:  Allergies  Allergen Reactions  . Wilder Glade [Dapagliflozin] Other (See Comments)    YEAST INFECTION   . Januvia [Sitagliptin] Diarrhea and Nausea And Vomiting  . Lisinopril Cough  . Tanzeum [Albiglutide] Nausea And Vomiting  . Trulicity [Dulaglutide] Nausea Only  . Sulfonamide Derivatives Rash    Past Medical History:  Diagnosis Date  . Asthma   . Depression   . Diabetes ([place])   . GERD (gastroesophageal reflux disease)   . Hyperlipidemia   . Hypertension   . Kidney cysts   . Kidney stones   . Migraine headache     Past Surgical History:  Procedure Laterality Date  . LEFT HEART CATH AND CORONARY ANGIOGRAPHY N/A 10/29/2017   Procedure: LEFT HEART CATH AND CORONARY ANGIOGRAPHY;  Surgeon: Martinique, Peter M, MD;  Location: [facility];  Service: Cardiovascular;  Laterality: N/A;  . none      Family History  Problem Relation Age of Onset  . Diabetes Mother   . Diabetes Maternal Grandmother   . Thyroid disease Cousin     Social History:  reports that she has never smoked. She has never used smokeless tobacco. She reports that she does not drink alcohol or use drugs.   Review of Systems   HYPERLIPIDEMIA:  She has mixed hyperlipidemia She has been switched to Crestor 40 mg daily because of persistently high LDL Also taking Zetia  Her LDL is below 100 Triglycerides are last at 286 compared to 451    Lab Results  Component Value Date   CHOL 133 07/18/2018   CHOL 210 (H) 05/08/2018   CHOL 215 (H) 10/28/2017   Lab Results  Component Value Date   HDL 33.90 (L) 07/18/2018   HDL 33.20 (L) 05/08/2018   HDL 29.60 (L) 10/28/2017   No results found for: [facility] Lab Results  Component Value Date   TRIG 286.0 (H) 07/18/2018   TRIG  (H) 05/08/2018    451.0 Triglyceride is over 400; calculations on Lipids are invalid.   TRIG 265.0 (H) 10/28/2017   Lab Results  Component Value Date   CHOLHDL 4 07/18/2018   CHOLHDL 6 05/08/2018   CHOLHDL 7 10/28/2017   Lab Results  Component Value Date   LDLDIRECT 79.0 07/18/2018   LDLDIRECT 125.0 05/08/2018   LDLDIRECT 154.0 10/28/2017            Hypertension: Blood pressure has been high since her 27s, managed by PCP with valsartan 320 mg Even though she has a blood pressure meter at home she has not checked it recently She says she feels somewhat weak and tired and occasionally lightheaded also  BP Readings from Last 3 Encounters:  09/03/18 94/62  07/23/18 120/62  11/06/17 138/90     Most recent eye exam was In 04/2016  Most recent foot exam: 03/2018    Physical Examination:  BP 94/62 (BP Location: Right Arm, Patient Position: Sitting, Cuff Size: Large)   Pulse 98   Ht 4\' 11"  (1.499 m)   Wt 170 lb (77.1 kg)   SpO2 95%   BMI 34.34 kg/m       ASSESSMENT:  Diabetes type 2, uncontrolled with BMI 33  See history of present illness for detailed discussion of current diabetes management, blood sugar patterns and problems identified  Her A1c is 8.1 compared to 9.7, fructosamine previously 265  She has had insulin resistance and requiring large doses of insulin  Currently with using Victoza and reducing her portions as well as overall caloric intake her blood sugars are dramatically better As discussed above she is having some nausea with the Victoza but this is not consistent Currently is just starting 1.8 mg dosage  She is currently wanting to keep her blood sugars better controlled and feels like she can continue this Although she has reduced her Tyler Aas however she is still having overall high fasting readings  She is starting to get hypoglycemia during the daytime from her decreased intake and has not adjusted her Humalog Discussed actions and effects  of basal and bolus insulins on blood sugars by time of day   HYPERTENSION: Blood pressure is unusually low for her and this may be related to her weight loss, overall decreased food intake and lower sodium content of her food  Elevate at home  PLAN:    She will try using the freestyle libre sensor on her abdomen to see if it works  Otherwise she can switch to the [employer] which will stay on better than the freestyle libre and avoid the problem with the sensor falling off  She will go down to 1.2 on the Victoza  She will try to take this right after supper instead of at bedtime so she can remember it better  Also she can take Zofran as needed for nausea control  Since she is going to be switching to [employer] because of insurance preference she will use 10 more units then the [place] dose for now  In the meantime she will take 110 units of Tresiba  She will not take any Humalog in the morning for now  Stop VALSARTAN and check blood pressure regularly at home, may need to only 80 mg instead of 320  Regular walking for exercise  Follow-up in 2 months  Counseling time on subjects discussed in assessment and plan sections is over 50% of today's 25 minute visit     Patient Instructions  Tresiba 110 units Skip Humalog in am Victoza 1.2mg  daily  Toujeo 10 units more than Tresiba  No Valsartan for now           Elayne Snare 09/03/2018, 4:19 PM   Note: This office note was prepared with Dragon voice recognition system technology. Any transcriptional errors that result from this process are unintentional.

## 2018-09-16 ENCOUNTER — Other Ambulatory Visit: Payer: Self-pay | Admitting: Endocrinology

## 2018-10-24 ENCOUNTER — Other Ambulatory Visit: Payer: Self-pay | Admitting: Endocrinology

## 2018-10-24 ENCOUNTER — Telehealth: Payer: Self-pay | Admitting: Endocrinology

## 2018-10-24 DIAGNOSIS — N914 Secondary oligomenorrhea: Secondary | ICD-10-CM

## 2018-10-24 NOTE — Telephone Encounter (Signed)
Yes

## 2018-10-24 NOTE — Telephone Encounter (Signed)
Patient is wanting to know if Dr. Dwyane Dee will put in an order for her labs to do a hormone test.  Please Advise, Thanks

## 2018-10-24 NOTE — Telephone Encounter (Signed)
Please ask her if she wants to be checked for menopause

## 2018-10-31 ENCOUNTER — Other Ambulatory Visit: Payer: Self-pay

## 2018-10-31 ENCOUNTER — Other Ambulatory Visit (INDEPENDENT_AMBULATORY_CARE_PROVIDER_SITE_OTHER): Payer: 59

## 2018-10-31 DIAGNOSIS — N914 Secondary oligomenorrhea: Secondary | ICD-10-CM

## 2018-10-31 DIAGNOSIS — Z794 Long term (current) use of insulin: Secondary | ICD-10-CM | POA: Diagnosis not present

## 2018-10-31 DIAGNOSIS — E1165 Type 2 diabetes mellitus with hyperglycemia: Secondary | ICD-10-CM | POA: Diagnosis not present

## 2018-10-31 LAB — BASIC METABOLIC PANEL
BUN: 15 mg/dL (ref 6–23)
CO2: 27 mEq/L (ref 19–32)
Calcium: 9.6 mg/dL (ref 8.4–10.5)
Chloride: 100 mEq/L (ref 96–112)
Creatinine, Ser: 0.57 mg/dL (ref 0.40–1.20)
GFR: 112.45 mL/min (ref >60.00)
Glucose, Bld: 229 mg/dL — ABNORMAL HIGH (ref 70–99)
Potassium: 4.1 mEq/L (ref 3.5–5.1)
Sodium: 137 mEq/L (ref 135–145)

## 2018-10-31 LAB — FOLLICLE STIMULATING HORMONE: FSH: 37.9 m[IU]/mL

## 2018-11-01 LAB — FRUCTOSAMINE: Fructosamine: 278 umol/L (ref 0–285)

## 2018-11-03 ENCOUNTER — Other Ambulatory Visit: Payer: Self-pay

## 2018-11-03 ENCOUNTER — Ambulatory Visit (INDEPENDENT_AMBULATORY_CARE_PROVIDER_SITE_OTHER): Payer: 59 | Admitting: Endocrinology

## 2018-11-03 ENCOUNTER — Encounter: Payer: Self-pay | Admitting: Endocrinology

## 2018-11-03 DIAGNOSIS — E1165 Type 2 diabetes mellitus with hyperglycemia: Secondary | ICD-10-CM

## 2018-11-03 DIAGNOSIS — Z794 Long term (current) use of insulin: Secondary | ICD-10-CM | POA: Diagnosis not present

## 2018-11-03 DIAGNOSIS — E782 Mixed hyperlipidemia: Secondary | ICD-10-CM | POA: Diagnosis not present

## 2018-11-03 NOTE — Progress Notes (Signed)
Recent glucose readings per patient:

## 2018-11-03 NOTE — Progress Notes (Signed)
Patient ID: Amy Clarke, female   DOB: Feb 02, 1970, 48 y.o.   MRN: [id number]           Reason for Appointment:  Follow-up for Type 2 Diabetes  Referring physician: Mayra Neer  Today's office visit was provided via telemedicine using video technique The patient was explained the limitations of evaluation and management by telemedicine and the availability of in person appointments.  The patient understood the limitations and agreed to proceed. Patient also understood that the telehealth visit is billable. . Location of the patient: Patient's home . Location of the provider: Physician office Only the patient and myself were participating in the encounter    History of Present Illness:          Date of diagnosis of type 2 diabetes mellitus: 2003?         Background history:   She had gestational diabetes in 1992 and subsequently was on diet alone She thinks she was started on diabetes medication about 15 years ago and probably took metformin which she took until about a year ago and this was stopped because of diarrhea.  Records show that she was taking 1000 mg of regular metformin twice a day With her PCP she has been on numerous diabetes medications over the years including Byetta, Trulicity, Jardiance and Januvia which apparently all caused side effects, mostly nausea or yeast infections with Vania Rea and Farxiga Her A1c has been mostly higher this year, last year has been as low as 7.2, in January her A1c was 8.4  Recent history:   INSULIN regimen is:  Toujeo 120 units in the morning.  Humalog 80 units twice daily  Non-insulin hypoglycemic drugs the patient is taking are: Synjardy XR 25/1000 in am, Victoza 1.8 mg daily  Her A1c last was 9.7 and is now 8.1  Current management, blood sugar patterns and problems identified:  She has been using Victoza 1.2 mg for the last few weeks as she was able to tolerate this fairly well  She is dilated up to more clicks after the  1.2 doses  However most of her high blood sugars recently in the last 2 weeks have been from stressful situation and stress eating  She thinks her weight is about the same her last visit about 6 weeks ago  Her insurance will not cover [place] and now she is taking TOUJEO but is needing at least 10 more units compared to [place] now  However FASTING blood sugars are still mostly high including this morning and also all the lab her fasting glucose was 229  She did not adjust her Humalog much based on what she is eating and usually takes the same amount  She is usually not eating breakfast but not clear which readings on her meter are before or after lunch  Generally tends to have higher nonfasting readings including at least 2 or 3 readings over 200 recently  Currently she is trying to walk up to 30 minutes on most days  Recent renal function is normal with continuing Synjardy  No recent hypoglycemia  She stopped using the freestyle libre because she has difficulty with the sensor sticking and she has a skin reaction to the armband.  Also with trying it on her abdomen she had falsely low readings        Side effects from medications have been: Diarhea with regular metformin   Glucose monitoring:  done  1-2 times a day  Glucometer: One Touch ultra 2 mostly  Blood Glucose readings by review her monitor reviewed by patient    PRE-MEAL Fasting Lunch Dinner Bedtime Overall  Glucose range:  146-229   171, 189  258   Mean/median:        POST-MEAL PC Breakfast PC Lunch PC Dinner  Glucose range:    169-267  Mean/median:       Previous readings:     PRE-MEAL Fasting Lunch Dinner Bedtime Overall  Glucose range:  99-241     53-283  Mean/median:  156  127  124   144+/-53   POST-MEAL PC Breakfast PC Lunch PC Dinner  Glucose range:    67-283  Mean/median:         Self-care: The diet that the patient has been following is: tries to limit high-fat foods and drinks with  sugar .     Typical meal intake: Breakfast is sometimes cereal/bagel/granola Bar.  Usually eating breakfast only if she is working  [employer] usually yogurt, cheese, fruit and crackers.  Dinner chicken with vegetables.  She will have snacks with granola bar or popcorn                Dietician visit, most recent: Several years ago in class                 Weight history:  Wt Readings from Last 3 Encounters:  09/03/18 170 lb (77.1 kg)  07/23/18 176 lb 6.4 oz (80 kg)  11/06/17 164 lb 1.9 oz (74.4 kg)    Glycemic control:   Lab Results  Component Value Date   HGBA1C 8.1 (H) 08/29/2018   HGBA1C 9.7 (H) 05/08/2018   HGBA1C 10.3 (A) 09/11/2017   Lab Results  Component Value Date   MICROALBUR 1.1 09/07/2016   CREATININE 0.57 10/31/2018   Lab Results  Component Value Date   MICRALBCREAT 1.2 09/07/2016    Lab Results  Component Value Date   FRUCTOSAMINE 278 10/31/2018   FRUCTOSAMINE 265 07/18/2018   FRUCTOSAMINE 372 (H) 09/10/2017      Allergies as of 11/03/2018      Reactions   Farxiga [dapagliflozin] Other (See Comments)   YEAST INFECTION   Januvia [sitagliptin] Diarrhea, Nausea And Vomiting   Lisinopril Cough   Tanzeum [albiglutide] Nausea And Vomiting   Trulicity [dulaglutide] Nausea Only   Sulfonamide Derivatives Rash      Medication List       Accurate as of November 03, 2018 12:00 PM. If you have any questions, ask your nurse or doctor.        Albuterol Sulfate 108 (90 Base) MCG/ACT Aepb Inhale 2 puffs into the lungs as needed (shortness of breath).   atorvastatin 80 MG tablet Commonly known as: LIPITOR Take 80 mg by mouth every evening.   budesonide-formoterol 160-4.5 MCG/ACT inhaler Commonly known as: SYMBICORT Inhale 2 puffs into the lungs 2 (two) times daily as needed (asthma).   buPROPion 150 MG 24 hr tablet Commonly known as: WELLBUTRIN XL Take 150 mg by mouth daily.   ezetimibe 10 MG tablet Commonly known as: Zetia Take 1 tablet (10 mg  total) by mouth daily.   fluticasone 50 MCG/ACT nasal spray Commonly known as: FLONASE SPRAY 2 SPRAYS INTO EACH NOSTRIL EVERY DAY   glucose blood test strip Commonly known as: ONE TOUCH ULTRA TEST Use as instructed to test 3 times daily   insulin lispro 100 UNIT/ML KwikPen Commonly known as: HumaLOG KwikPen Inject 80 units under the skin  once daily in the evening.   Insulin Pen Needle 31G X 5 MM Misc Use for insulin and Victoza   Magnesium 250 MG Tabs Take 250 mg by mouth daily.   metoprolol tartrate 25 MG tablet Commonly known as: LOPRESSOR Take 1 tablet (25 mg total) by mouth 2 (two) times daily.   omeprazole 20 MG capsule Commonly known as: PRILOSEC Take 20 mg by mouth 2 (two) times daily before a meal.   ondansetron 4 MG tablet Commonly known as: Zofran Take 1 tablet (4 mg total) by mouth every 8 (eight) hours as needed for nausea or vomiting.   rosuvastatin 40 MG tablet Commonly known as: Crestor Take 1 tablet (40 mg total) by mouth daily.   Synjardy XR 25-1000 MG Tb24 Generic drug: Empagliflozin-metFORMIN HCl ER TAKE 1 TABLET BY MOUTH EVERY DAY   Toujeo SoloStar 300 UNIT/ML Sopn Generic drug: Insulin Glargine (1 Unit Dial) Inject 120 Units into the skin daily.   valsartan 320 MG tablet Commonly known as: DIOVAN Take 320 mg by mouth daily.   venlafaxine XR 150 MG 24 hr capsule Commonly known as: EFFEXOR-XR Take 150 mg by mouth daily with breakfast.   Victoza 18 MG/3ML Sopn Generic drug: liraglutide Inject 0.2 mLs (1.2 mg total) into the skin daily. Inject once daily at the same time What changed: how much to take   vitamin C 500 MG tablet Commonly known as: ASCORBIC ACID Take 500 mg by mouth daily.   Vitamin D (Cholecalciferol) 25 MCG (1000 UT) Caps Take 1,000 Units by mouth daily.       Allergies:  Allergies  Allergen Reactions  . Wilder Glade [Dapagliflozin] Other (See Comments)    YEAST INFECTION   . Januvia [Sitagliptin] Diarrhea and Nausea  And Vomiting  . Lisinopril Cough  . Tanzeum [Albiglutide] Nausea And Vomiting  . Trulicity [Dulaglutide] Nausea Only  . Sulfonamide Derivatives Rash    Past Medical History:  Diagnosis Date  . Asthma   . Depression   . Diabetes ([place])   . GERD (gastroesophageal reflux disease)   . Hyperlipidemia   . Hypertension   . Kidney cysts   . Kidney stones   . Migraine headache     Past Surgical History:  Procedure Laterality Date  . LEFT HEART CATH AND CORONARY ANGIOGRAPHY N/A 10/29/2017   Procedure: LEFT HEART CATH AND CORONARY ANGIOGRAPHY;  Surgeon: Martinique, Peter M, MD;  Location: [facility];  Service: Cardiovascular;  Laterality: N/A;  . none      Family History  Problem Relation Age of Onset  . Diabetes Mother   . Diabetes Maternal Grandmother   . Thyroid disease Cousin     Social History:  reports that she has never smoked. She has never used smokeless tobacco. She reports that she does not drink alcohol or use drugs.   Review of Systems   HYPERLIPIDEMIA:  She has mixed hyperlipidemia She has been taking Crestor 40 mg daily because of persistently high LDL Also taking Zetia  Her LDL is below 100, was 42 in August Triglycerides are last at 258 compared to 286    Lab Results  Component Value Date   CHOL 133 07/18/2018   CHOL 210 (H) 05/08/2018   CHOL 215 (H) 10/28/2017   Lab Results  Component Value Date   HDL 33.90 (L) 07/18/2018   HDL 33.20 (L) 05/08/2018   HDL 29.60 (L) 10/28/2017   No results found for: [facility] Lab Results  Component Value Date   TRIG  286.0 (H) 07/18/2018   TRIG (H) 05/08/2018    451.0 Triglyceride is over 400; calculations on Lipids are invalid.   TRIG 265.0 (H) 10/28/2017   Lab Results  Component Value Date   CHOLHDL 4 07/18/2018   CHOLHDL 6 05/08/2018   CHOLHDL 7 10/28/2017   Lab Results  Component Value Date   LDLDIRECT 79.0 07/18/2018   LDLDIRECT 125.0 05/08/2018   LDLDIRECT 154.0 10/28/2017             Hypertension: Blood pressure has been high since her 15s, managed by PCP with valsartan 320 mg Even though she has a blood pressure meter at home she has not checked it recently She says she feels somewhat weak and tired and occasionally lightheaded also  BP Readings from Last 3 Encounters:  09/03/18 94/62  07/23/18 120/62  11/06/17 138/90   Microalbuminuria: Last microalbumin ratio 73.6, done on 2/20  Most recent eye exam was In 04/2016  Most recent foot exam: 03/2018    Physical Examination:  There were no vitals taken for this visit.      ASSESSMENT:  Diabetes type 2, uncontrolled with BMI 33  See history of present illness for detailed discussion of current diabetes management, blood sugar patterns and problems identified  Her A1c was last 8.1 compared to 9.7, fructosamine now 278, previously 265  She has had insulin resistance and requiring large doses of insulin  Generally with using Victoza her blood sugars have been better overall although starting to go up more recently because of poor diet and inconsistent control of her caloric intake Weight has leveled off Although her blood sugars are little better than last 3 to 4 days with stress level going down she still has mildly increased fasting readings and frequently increased postprandial readings when she monitors them Not benefiting from [place] as much recently although likely still has better control as judged by her fructosamine  Recommendations: See below   HYPERTENSION: Blood pressure is reportedly back to normal and she is back on valsartan as before  Perimenopause: Cucumber is mildly increased at 38, likely in early menopause  PLAN:    She will need to talk to the diabetes educator regarding options for insulin pumps preferably the closed-loop systems  Discussed the 3 options available and how they are different  Most likely may need to change to U-500 insulin when she goes on the pump, currently taking  280 units of insulin a day  She will also look into the Dexcom sensor and this should help her with more consistent blood sugar monitoring and evaluation of postprandial readings  Toujeo 130 units daily  Humalog at supper 90 units with trying to get the reading after meals at least under 180, may be able to reduce if blood sugars are improving with better diet  No change in Victoza as yet  She will continue to improve her diet and she can discuss stress management with her PCP  Brisk walking for exercise  Follow-up in 2 months  Counseling time on subjects discussed in assessment and plan sections is over 50% of today's 25 minute visit     There are no Patient Instructions on file for this visit.          Elayne Snare 11/03/2018, 12:00 PM   Note: This office note was prepared with Dragon voice recognition system technology. Any transcriptional errors that result from this process are unintentional.

## 2018-11-13 ENCOUNTER — Other Ambulatory Visit: Payer: Self-pay | Admitting: Endocrinology

## 2018-11-21 ENCOUNTER — Other Ambulatory Visit: Payer: Self-pay | Admitting: Endocrinology

## 2018-11-28 ENCOUNTER — Other Ambulatory Visit: Payer: Self-pay | Admitting: Endocrinology

## 2018-12-09 NOTE — Progress Notes (Signed)
CARDIOLOGY OFFICE NOTE  Date:  12/10/2018    [name] Date of Birth: [birth year] Medical Record [id number]  PCP:  Mayra Neer, MD  Cardiologist:  Gillian Shields   No chief complaint on file.   History of Present Illness: Amy Clarke is a 48 y.o. female with history of chest pain   She was seen here back in 2017 for tachycardia and cardiovascular risk factors which include DM, HTN and HLD. EKG with narrow complex tachycardia vs long RP tachycardia. She had had a viral component.  Echo with normal EF. Other issues include asthma. FH + for father having early CAD with an MI in his 72's.   SSCP seen by PA October 2019 subsequent cath with no significant CAD per Dr Martinique with normal EF and normal LVEDP  She is on insulin as she had multiple side effects mostly GI/Yest infections with oral meds  A1c 8.1 in July 2020   HR high today Some dyspnea no chest pain 3 of her 4 sons are home one 71 yo is at [facility]    Past Medical History:  Diagnosis Date  . Asthma   . Depression   . Diabetes ([place])   . GERD (gastroesophageal reflux disease)   . Hyperlipidemia   . Hypertension   . Kidney cysts   . Kidney stones   . Migraine headache     Past Surgical History:  Procedure Laterality Date  . LEFT HEART CATH AND CORONARY ANGIOGRAPHY N/A 10/29/2017   Procedure: LEFT HEART CATH AND CORONARY ANGIOGRAPHY;  Surgeon: Martinique, Jaeson Molstad M, MD;  Location: [facility];  Service: Cardiovascular;  Laterality: N/A;  . none       Medications: Current Meds  Medication Sig  . Albuterol Sulfate 108 (90 Base) MCG/ACT AEPB Inhale 2 puffs into the lungs as needed (shortness of breath).   Marland Kitchen atorvastatin (LIPITOR) 80 MG tablet Take 80 mg by mouth every evening.   . budesonide-formoterol (SYMBICORT) 160-4.5 MCG/ACT inhaler Inhale 2 puffs into the lungs 2 (two) times daily as needed (asthma).   Marland Kitchen buPROPion (WELLBUTRIN XL) 150 MG 24 hr tablet Take 150 mg by mouth daily.  Marland Kitchen  ezetimibe (ZETIA) 10 MG tablet Take 1 tablet (10 mg total) by mouth daily.  . fluticasone (FLONASE) 50 MCG/ACT nasal spray SPRAY 2 SPRAYS INTO EACH NOSTRIL EVERY DAY  . glucose blood (ONE TOUCH ULTRA TEST) test strip Use as instructed to test 3 times daily  . Insulin Glargine, 1 Unit Dial, (TOUJEO SOLOSTAR) 300 UNIT/ML SOPN Inject 120 Units into the skin daily.  . insulin lispro (HUMALOG KWIKPEN) 100 UNIT/ML KwikPen INJECT 80 UNITS UNDER THE SKIN ONCE DAILY IN THE EVENING.  . Insulin Pen Needle 31G X 5 MM MISC Use for insulin and Victoza  . Magnesium 250 MG TABS Take 250 mg by mouth daily.  . metoprolol tartrate (LOPRESSOR) 25 MG tablet Take 1 tablet (25 mg total) by mouth 2 (two) times daily.  Marland Kitchen omeprazole (PRILOSEC) 20 MG capsule Take 20 mg by mouth 2 (two) times daily before a meal.   . ondansetron (ZOFRAN) 4 MG tablet Take 1 tablet (4 mg total) by mouth every 8 (eight) hours as needed for nausea or vomiting.  . rosuvastatin (CRESTOR) 40 MG tablet Take 1 tablet (40 mg total) by mouth daily.  Marland Kitchen SYNJARDY XR 25-1000 MG TB24 TAKE 1 TABLET BY MOUTH EVERY DAY  . valsartan (DIOVAN) 320 MG tablet Take 320 mg by mouth  daily.   . venlafaxine XR (EFFEXOR-XR) 150 MG 24 hr capsule Take 150 mg by mouth daily with breakfast.  . VICTOZA 18 MG/3ML SOPN Inject 0.3 mLs (1.8 mg total) into the skin daily. Inject 1.8mg  under the skin once daily.  . vitamin C (ASCORBIC ACID) 500 MG tablet Take 500 mg by mouth daily.  . Vitamin D, Cholecalciferol, 1000 units CAPS Take 1,000 Units by mouth daily.      Allergies: Allergies  Allergen Reactions  . Wilder Glade [Dapagliflozin] Other (See Comments)    YEAST INFECTION   . Januvia [Sitagliptin] Diarrhea and Nausea And Vomiting  . Lisinopril Cough  . Tanzeum [Albiglutide] Nausea And Vomiting  . Trulicity [Dulaglutide] Nausea Only  . Sulfonamide Derivatives Rash    Social History: The patient  reports that she has never smoked. She has never used smokeless tobacco.  She reports that she does not drink alcohol or use drugs.   Family History: The patient's family history includes Diabetes in her maternal grandmother and mother; Thyroid disease in her cousin.   Review of Systems: Please see the history of present illness.   Otherwise, the review of systems is positive for none.   All other systems are reviewed and negative.   Physical Exam: VS:  BP 112/76   Pulse (!) 111   Ht 4\' 11"  (1.499 m)   Wt 170 lb (77.1 kg)   SpO2 98%   BMI 34.34 kg/m  .  BMI Body mass index is 34.34 kg/m.  Wt Readings from Last 3 Encounters:  12/10/18 170 lb (77.1 kg)  09/03/18 170 lb (77.1 kg)  07/23/18 176 lb 6.4 oz (80 kg)    Affect appropriate Healthy:  appears stated age HEENT: normal Neck supple with no adenopathy JVP normal no bruits no thyromegaly Lungs clear with no wheezing and good diaphragmatic motion Heart:  S1/S2 no murmur, no rub, gallop or click PMI normal Abdomen: benighn, BS positve, no tenderness, no AAA no bruit.  No HSM or HJR Distal pulses intact with no bruits No edema Neuro non-focal Skin warm and dry No muscular weakness   LABORATORY DATA:  EKG:   12/10/18 ST rate 111 nonspecific ST changes   Lab Results  Component Value Date   WBC 6.3 10/29/2017   HGB 13.1 10/29/2017   HCT 41.5 10/29/2017   PLT 215 10/29/2017   GLUCOSE 229 (H) 10/31/2018   CHOL 133 07/18/2018   TRIG 286.0 (H) 07/18/2018   HDL 33.90 (L) 07/18/2018   LDLDIRECT 79.0 07/18/2018   ALT 35 05/08/2018   AST 21 05/08/2018   NA 137 10/31/2018   K 4.1 10/31/2018   CL 100 10/31/2018   CREATININE 0.57 10/31/2018   BUN 15 10/31/2018   CO2 27 10/31/2018   TSH 0.770 10/28/2017   INR 0.98 10/28/2017   HGBA1C 8.1 (H) 08/29/2018   MICROALBUR 1.1 09/07/2016     BNP (last 3 results) No results for input(s): BNP in the last 8760 hours.  ProBNP (last 3 results) No results for input(s): PROBNP in the last 8760 hours.   Other Studies Reviewed Today:  LEFT  HEART CATH AND CORONARY ANGIOGRAPHY 10/2017  Conclusion     The left ventricular systolic function is normal.  LV end diastolic pressure is normal.  The left ventricular ejection fraction is 55-65% by visual estimate.    1. Normal coronary anatomy 2. Normal LV function 3. Normal LVEDP  Plan: consider alternative causes of chest pain  No indication for antiplatelet therapy at  this time.     GXT Study Highlights 03/2016    Blood pressure demonstrated a normal response to exercise.  There was no ST segment deviation noted during stress.  Normal exercise treadmill stress test. Normal functional capacity. Normal BP response to exertion.     Echo Study Conclusions 06/2015  - Left ventricle: The cavity size was normal. Systolic function was normal. The estimated ejection fraction was in the range of 55% to 60%. Wall motion was normal; there were no regional wall motion abnormalities. Left ventricular diastolic function parameters were normal. - Atrial septum: No defect or patent foramen ovale was identified.  Assessment/Plan:  1. Chest Pain :  Normal cath 10/29/17 observe   2. History of tachycardia - on beta blocker. HR still high increase Lopressor to 50 bid 48 hours monitor to see average and make Sure she has normal circadian drop at night f/u echo to make sure EF still normal   3. DM - uncontrolled by A1C - per PCP need to see eye doctor   4. HLD - on crestor labs with primary   5. Obesity - CV risk factor modification encouraged.   6. Significant situational stress. This may be the culprit of her symptoms.    Current medicines are reviewed with the patient today.  The patient does not have concerns regarding medicines other than what has been noted above.  The following changes have been made:  See above.  Labs/ tests ordered today include: Holter, TTE Increase Lopressor 50 bid   No orders of the defined types were placed in this  encounter.    Disposition:   FU with [place] as needed.   Patient is agreeable to this plan and will call if any problems develop in the interim.   Signed: Jenkins Rouge, MD  12/10/2018 10:14 AM  [facility] [address] [place], [place]  [address] Phone: [phone] Fax: [phone]

## 2018-12-10 ENCOUNTER — Ambulatory Visit: Payer: 59 | Admitting: Cardiovascular Disease

## 2018-12-10 ENCOUNTER — Encounter: Payer: Self-pay | Admitting: Cardiovascular Disease

## 2018-12-10 ENCOUNTER — Other Ambulatory Visit: Payer: Self-pay

## 2018-12-10 VITALS — BP 112/76 | HR 111 | Ht 59.0 in | Wt 170.0 lb

## 2018-12-10 DIAGNOSIS — R000 Tachycardia, unspecified: Secondary | ICD-10-CM | POA: Diagnosis not present

## 2018-12-10 MED ORDER — METOPROLOL TARTRATE 50 MG PO TABS: 50.0000 mg | ORAL_TABLET | Freq: Two times a day (BID) | ORAL | 3 refills | Status: DC

## 2018-12-10 NOTE — Patient Instructions (Signed)
Medication Instructions:  Your physician has recommended you make the following change in your medication:   1-Increase Metoprolol 50 mg by mouth twice daily.   *If you need a refill on your cardiac medications before your next appointment, please call your pharmacy*  Lab Work:  If you have labs (blood work) drawn today and your tests are completely normal, you will receive your results only by: Marland Kitchen MyChart Message (if you have MyChart) OR . A paper copy in the mail If you have any lab test that is abnormal or we need to change your treatment, we will call you to review the results.  Testing/Procedures: Your physician has requested that you have an echocardiogram. Echocardiography is a painless test that uses sound waves to create images of your heart. It provides your doctor with information about the size and shape of your heart and how well your heart's chambers and valves are working. This procedure takes approximately one hour. There are no restrictions for this procedure.  Your physician has recommended that you wear a holter monitor 48 hours. Holter monitors are medical devices that record the heart's electrical activity. Doctors most often use these monitors to diagnose arrhythmias. Arrhythmias are problems with the speed or rhythm of the heartbeat. The monitor is a small, portable device. You can wear one while you do your normal daily activities. This is usually used to diagnose what is causing palpitations/syncope (passing out).  Follow-Up: At [facility] you and your health needs are our priority.  As part of our continuing mission to provide you with exceptional heart care, we have created designated Provider Care Teams.  These Care Teams include your primary Cardiologist (physician) and Advanced Practice Providers (APPs -  Physician Assistants and Nurse Practitioners) who all work together to provide you with the care you need, when you need it.  Your next appointment:   3  months  The format for your next appointment:   In Person  Provider:   You may see Jenkins Rouge, MD or one of the following Advanced Practice Providers on your designated Care Team:    Richardson Dopp, PA-C  Vin Birmingham, [place]  Daune Perch, [place]

## 2018-12-18 ENCOUNTER — Other Ambulatory Visit: Payer: Self-pay

## 2018-12-18 ENCOUNTER — Ambulatory Visit (HOSPITAL_COMMUNITY): Payer: 59 | Attending: Cardiology

## 2018-12-18 DIAGNOSIS — R000 Tachycardia, unspecified: Secondary | ICD-10-CM | POA: Insufficient documentation

## 2018-12-19 ENCOUNTER — Telehealth: Payer: Self-pay

## 2018-12-19 NOTE — Telephone Encounter (Signed)
3 day ZIO ordered and mailed to pt. Order changed from Holter to Long Term due to [place] not bringing pt's in the office at this time.

## 2018-12-29 ENCOUNTER — Other Ambulatory Visit: Payer: Self-pay | Admitting: Endocrinology

## 2019-01-01 ENCOUNTER — Other Ambulatory Visit: Payer: Self-pay | Admitting: Endocrinology

## 2019-01-01 ENCOUNTER — Telehealth: Payer: Self-pay

## 2019-01-01 DIAGNOSIS — E1165 Type 2 diabetes mellitus with hyperglycemia: Secondary | ICD-10-CM

## 2019-01-01 DIAGNOSIS — Z794 Long term (current) use of insulin: Secondary | ICD-10-CM

## 2019-01-01 NOTE — Telephone Encounter (Signed)
Have discussed with patient today.  She wants a closed-loop pump.   Linda please set up an appointment for her to see the pumps.  I told her to start back on the Toujeo and Humalog and not stop the Toujeo along with the Victoza and Synjardy Referral done.  I will see her when we are starting the pump unless there is a delay

## 2019-01-01 NOTE — Telephone Encounter (Signed)
Patient called in wanting to let Dr know she has decided to go on Dexcom and Amonipod.  And she no longer wants to take VICTOZA 18 MG/3ML SOPN, Insulin Glargine, 1 Unit Dial, (TOUJEO SOLOSTAR) 300 UNIT/ML SOPN  Patient states she has been sick on her stomach and yeast infection since she has been taking the medication    Please call and advise

## 2019-01-05 ENCOUNTER — Ambulatory Visit (INDEPENDENT_AMBULATORY_CARE_PROVIDER_SITE_OTHER): Payer: 59

## 2019-01-05 ENCOUNTER — Telehealth: Payer: Self-pay

## 2019-01-05 DIAGNOSIS — R000 Tachycardia, unspecified: Secondary | ICD-10-CM

## 2019-01-05 NOTE — Telephone Encounter (Signed)
MEDICATION: insulin lispro (HUMALOG KWIKPEN) 100 UNIT/ML KwikPen  PHARMACY:  [id number] - [place], [place] - [address]  IS THIS A 90 DAY SUPPLY :   IS PATIENT OUT OF MEDICATION:   IF NOT; HOW MUCH IS LEFT:   LAST APPOINTMENT DATE: @12 /03/2018  NEXT APPOINTMENT DATE:@Visit  date not found  DO WE HAVE YOUR PERMISSION TO LEAVE A DETAILED MESSAGE:  OTHER COMMENTS:    **Let patient know to contact pharmacy at the end of the day to make sure medication is ready. **  ** Please notify patient to allow 48-72 hours to process**  **Encourage patient to contact the pharmacy for refills or they can request refills through [facility]**

## 2019-01-05 NOTE — Telephone Encounter (Signed)
Message left on machine to call me to schedule an appointment to discuss insulin pumps

## 2019-01-06 ENCOUNTER — Other Ambulatory Visit: Payer: Self-pay

## 2019-01-06 MED ORDER — INSULIN LISPRO (1 UNIT DIAL) 100 UNIT/ML (KWIKPEN): PEN_INJECTOR | SUBCUTANEOUS | 2 refills | Status: DC

## 2019-01-06 NOTE — Telephone Encounter (Signed)
Rx sent 

## 2019-01-07 ENCOUNTER — Other Ambulatory Visit: Payer: Self-pay

## 2019-01-07 ENCOUNTER — Encounter: Payer: 59 | Attending: Endocrinology | Admitting: Nutrition

## 2019-01-07 DIAGNOSIS — E119 Type 2 diabetes mellitus without complications: Secondary | ICD-10-CM | POA: Insufficient documentation

## 2019-01-09 ENCOUNTER — Other Ambulatory Visit: Payer: Self-pay

## 2019-01-09 MED ORDER — DEXCOM G6 TRANSMITTER MISC: 1.0000 | 3 refills | Status: DC

## 2019-01-09 MED ORDER — DEXCOM G6 RECEIVER DEVI: 1.0000 | 0 refills | Status: DC

## 2019-01-09 MED ORDER — DEXCOM G6 SENSOR MISC: 1.0000 | 3 refills | Status: DC

## 2019-01-12 ENCOUNTER — Encounter: Payer: 59 | Admitting: Nutrition

## 2019-01-12 NOTE — Progress Notes (Signed)
Amy Clarke is wanting a new pump and wanted a review of all pumps on the market.  We discussed each model, she was given a brochure on each model.  We discussed the advantages and disadvantages of each model and their repective CGMS.  She decided on the tandem and paperwork was filled out and faxed to [employer].

## 2019-01-12 NOTE — Patient Instructions (Signed)
Call to schedule training when pump comes in

## 2019-01-21 ENCOUNTER — Telehealth: Payer: Self-pay

## 2019-01-21 NOTE — Telephone Encounter (Signed)
PA initiated via [email or web address] for Dexcom G6 sensors and transmitters.   PA for Sensors: Kamya Reineke Key: [id number] - PA Case ID: [id number] Need help? Call us at [phone] Status Sent to [place] Sensor Form [facility] Electronic Prior Authorization Form [phone] NCPDP) [facility] is reviewing your PA request. Typically an electronic response will be received within 72 hours. To check for an update later, open this request from your dashboard.  You may close this dialog and return to your dashboard to perform other tasks.  PA for Transmitter: Samyia Taran Key: [id number] - PA Case ID: [id number] Need help? Call us at [phone] Status Sent to [place] Transmitter Form [facility] Electronic Prior Authorization Form [phone] NCPDP) Your information has been sent to [facility].

## 2019-01-27 NOTE — Telephone Encounter (Signed)
Pt scheduled for f/u with prior labs next month.

## 2019-01-27 NOTE — Telephone Encounter (Signed)
Needed.  She needs to schedule follow-up next month with labs

## 2019-01-27 NOTE — Telephone Encounter (Signed)
Received fax from [facility] stating that pt has been denied for Dexcom G6 sensors and transmitter because they are both non-covered benefit items and are excluded from coverage in accordance with the terms and conditions of the pt's insurance plan.

## 2019-01-29 ENCOUNTER — Ambulatory Visit
Admission: RE | Admit: 2019-01-29 | Discharge: 2019-01-29 | Disposition: A | Discharge status: Home / Self Care | Payer: 59 | Source: Ambulatory Visit | Attending: Family Medicine | Admitting: Family Medicine

## 2019-01-29 ENCOUNTER — Other Ambulatory Visit: Payer: Self-pay

## 2019-01-29 DIAGNOSIS — Z1231 Encounter for screening mammogram for malignant neoplasm of breast: Secondary | ICD-10-CM

## 2019-02-17 ENCOUNTER — Other Ambulatory Visit: Payer: Self-pay | Admitting: Endocrinology

## 2019-02-20 ENCOUNTER — Other Ambulatory Visit: Payer: 59

## 2019-02-24 ENCOUNTER — Ambulatory Visit: Payer: 59 | Admitting: Endocrinology

## 2019-03-03 NOTE — Progress Notes (Signed)
CARDIOLOGY OFFICE NOTE  Date:  03/11/2019    [name] Date of Birth: November 08, 1970 Medical Record [id number]  PCP:  Mayra Neer, MD  Cardiologist:  Gillian Shields   No chief complaint on file.   History of Present Illness: [name] is a 49 y.o. female with history of chest pain   She was seen here back in 2017 for tachycardia and cardiovascular risk factors which include DM, HTN and HLD. EKG with narrow complex tachycardia vs long RP tachycardia. She had had a viral component.  Echo with normal EF. Other issues include asthma. FH + for father having early CAD with an MI in his 46's.   SSCP seen by PA October 2019 subsequent cath with no significant CAD per Dr Martinique with normal EF and normal LVEDP  She is on insulin as she had multiple side effects mostly GI/Yest infections with oral meds  A1c 8.1 in July 2020   3 of her 4 sons are home They all went to [place] high Lopressor dose adjusted 12/10/18 to 50 mg bid   Monitor 01/27/29 showed no arrhythmia with average HR 92 bpm Only one run of atrial tachycardia 8 beats  Normal circadian drop at night lowest HR 63 bpm   Echo 12/18/18 EF 60-65% no valve disease reviewed  Risk factors not well modified weight up A1c 8 range  Kids are doing ok youngest graduating [employer], one is Building control surveyor and uses her garage for projects and one is an Geographical information systems officer and was at [employer] but now back home   Past Medical History:  Diagnosis Date  . Asthma   . Depression   . Diabetes ([place])   . GERD (gastroesophageal reflux disease)   . Hyperlipidemia   . Hypertension   . Kidney cysts   . Kidney stones   . Migraine headache     Past Surgical History:  Procedure Laterality Date  . LEFT HEART CATH AND CORONARY ANGIOGRAPHY N/A 10/29/2017   Procedure: LEFT HEART CATH AND CORONARY ANGIOGRAPHY;  Surgeon: Martinique, Genavieve Mangiapane M, MD;  Location: [facility];  Service: Cardiovascular;  Laterality: N/A;  . none        Medications: Current Meds  Medication Sig  . Albuterol Sulfate 108 (90 Base) MCG/ACT AEPB Inhale 2 puffs into the lungs as needed (shortness of breath).   Marland Kitchen atorvastatin (LIPITOR) 80 MG tablet Take 80 mg by mouth every evening.   . budesonide-formoterol (SYMBICORT) 160-4.5 MCG/ACT inhaler Inhale 2 puffs into the lungs 2 (two) times daily as needed (asthma).   Marland Kitchen buPROPion (WELLBUTRIN XL) 150 MG 24 hr tablet Take 150 mg by mouth daily.  . Continuous Blood Gluc Receiver (Advance) [place] 1 each by Does not apply route See admin instructions. Use to monitor blood sugars with Dexcom G6 sensor and transmitter.  . Continuous Blood Gluc Sensor (DEXCOM G6 SENSOR) MISC 1 each by Does not apply route See admin instructions. Use one sensor every 10 days to monitor blood sugar.  . Continuous Blood Gluc Transmit (DEXCOM G6 TRANSMITTER) MISC 1 each by Does not apply route See admin instructions. Use one transmitter every 90 days to transmit blood sugar levels.  . ezetimibe (ZETIA) 10 MG tablet Take 1 tablet (10 mg total) by mouth daily.  . fluticasone (FLONASE) 50 MCG/ACT nasal spray SPRAY 2 SPRAYS INTO EACH NOSTRIL EVERY DAY  . glucose blood (ONE TOUCH ULTRA TEST) test strip Use as instructed to test 3 times daily  .  insulin lispro (HUMALOG KWIKPEN) 100 UNIT/ML KwikPen Inject 90 units under the skin up to twice daily.  . Insulin Pen Needle 31G X 5 MM MISC Use for insulin and Victoza  . Magnesium 250 MG TABS Take 250 mg by mouth daily.  . metoprolol tartrate (LOPRESSOR) 50 MG tablet Take 1 tablet (50 mg total) by mouth 2 (two) times daily.  Marland Kitchen omeprazole (PRILOSEC) 20 MG capsule Take 20 mg by mouth 2 (two) times daily before a meal.   . ondansetron (ZOFRAN) 4 MG tablet Take 1 tablet (4 mg total) by mouth every 8 (eight) hours as needed for nausea or vomiting.  . rosuvastatin (CRESTOR) 40 MG tablet Take 1 tablet (40 mg total) by mouth daily.  . TOUJEO SOLOSTAR 300 UNIT/ML SOPN INJECT 120 UNITS  INTO THE SKIN DAILY  . valsartan (DIOVAN) 320 MG tablet Take 320 mg by mouth daily.   Marland Kitchen venlafaxine XR (EFFEXOR-XR) 150 MG 24 hr capsule Take 150 mg by mouth daily with breakfast.  . vitamin C (ASCORBIC ACID) 500 MG tablet Take 500 mg by mouth daily.  . Vitamin D, Cholecalciferol, 1000 units CAPS Take 1,000 Units by mouth daily.      Allergies: Allergies  Allergen Reactions  . Wilder Glade [Dapagliflozin] Other (See Comments)    YEAST INFECTION   . Januvia [Sitagliptin] Diarrhea and Nausea And Vomiting  . Lisinopril Cough  . Tanzeum [Albiglutide] Nausea And Vomiting  . Trulicity [Dulaglutide] Nausea Only  . Sulfonamide Derivatives Rash    Social History: The patient  reports that she has never smoked. She has never used smokeless tobacco. She reports that she does not drink alcohol or use drugs.   Family History: The patient's family history includes Diabetes in her maternal grandmother and mother; Thyroid disease in her cousin.   Review of Systems: Please see the history of present illness.   Otherwise, the review of systems is positive for none.   All other systems are reviewed and negative.   Physical Exam: VS:  BP 110/68   Pulse 87   Ht 4\' 11"  (1.499 m)   Wt 175 lb (79.4 kg)   SpO2 95%   BMI 35.35 kg/m  .  BMI Body mass index is 35.35 kg/m.  Wt Readings from Last 3 Encounters:  03/11/19 175 lb (79.4 kg)  12/10/18 170 lb (77.1 kg)  09/03/18 170 lb (77.1 kg)    Affect appropriate Healthy:  appears stated age HEENT: normal Neck supple with no adenopathy JVP normal no bruits no thyromegaly Lungs clear with no wheezing and good diaphragmatic motion Heart:  S1/S2 no murmur, no rub, gallop or click PMI normal Abdomen: benighn, BS positve, no tenderness, no AAA no bruit.  No HSM or HJR Distal pulses intact with no bruits No edema Neuro non-focal Skin warm and dry No muscular weakness   LABORATORY DATA:  EKG:   12/10/18 ST rate 111 nonspecific ST changes   Lab  Results  Component Value Date   WBC 6.3 10/29/2017   HGB 13.1 10/29/2017   HCT 41.5 10/29/2017   PLT 215 10/29/2017   GLUCOSE 229 (H) 10/31/2018   CHOL 133 07/18/2018   TRIG 286.0 (H) 07/18/2018   HDL 33.90 (L) 07/18/2018   LDLDIRECT 79.0 07/18/2018   ALT 35 05/08/2018   AST 21 05/08/2018   NA 137 10/31/2018   K 4.1 10/31/2018   CL 100 10/31/2018   CREATININE 0.57 10/31/2018   BUN 15 10/31/2018   CO2 27 10/31/2018   TSH  0.770 10/28/2017   INR 0.98 10/28/2017   HGBA1C 8.1 (H) 08/29/2018   MICROALBUR 1.1 09/07/2016     BNP (last 3 results) No results for input(s): BNP in the last 8760 hours.  ProBNP (last 3 results) No results for input(s): PROBNP in the last 8760 hours.   Other Studies Reviewed Today:  LEFT HEART CATH AND CORONARY ANGIOGRAPHY 10/2017  Conclusion     The left ventricular systolic function is normal.  LV end diastolic pressure is normal.  The left ventricular ejection fraction is 55-65% by visual estimate.    1. Normal coronary anatomy 2. Normal LV function 3. Normal LVEDP  Plan: consider alternative causes of chest pain  No indication for antiplatelet therapy at this time.     GXT Study Highlights 03/2016    Blood pressure demonstrated a normal response to exercise.  There was no ST segment deviation noted during stress.  Normal exercise treadmill stress test. Normal functional capacity. Normal BP response to exertion.     Echo Study Conclusions 06/2015  - Left ventricle: The cavity size was normal. Systolic function was normal. The estimated ejection fraction was in the range of 55% to 60%. Wall motion was normal; there were no regional wall motion abnormalities. Left ventricular diastolic function parameters were normal. - Atrial septum: No defect or patent foramen ovale was identified.  Assessment/Plan:  1. Chest Pain :  Normal cath 10/29/17 observe Normal echo 12/18/18 EF 60-65%  2. Tachycardia:   Improved on beta blocker normal nightly circadian drop   3. DM - uncontrolled by A1C - per PCP need to see eye doctor   4. HLD - on crestor labs with primary   5. Obesity - CV risk factor modification encouraged.   6. Significant situational stress. This may be the culprit of her symptoms.    Current medicines are reviewed with the patient today.  The patient does not have concerns regarding medicines other than what has been noted above.  The following changes have been made:  See above.  Labs/ tests ordered today include:  None   No orders of the defined types were placed in this encounter.    Disposition:   FU with [place] in a year    Signed: Jenkins Rouge, MD  03/11/2019 11:06 AM  [facility] [address] [place], [place]  [address] Phone: [phone] Fax: [phone]

## 2019-03-11 ENCOUNTER — Other Ambulatory Visit: Payer: Self-pay

## 2019-03-11 ENCOUNTER — Encounter: Payer: Self-pay | Admitting: Cardiovascular Disease

## 2019-03-11 ENCOUNTER — Other Ambulatory Visit (INDEPENDENT_AMBULATORY_CARE_PROVIDER_SITE_OTHER): Payer: 59

## 2019-03-11 ENCOUNTER — Ambulatory Visit (INDEPENDENT_AMBULATORY_CARE_PROVIDER_SITE_OTHER): Payer: 59 | Admitting: Cardiovascular Disease

## 2019-03-11 VITALS — BP 110/68 | HR 87 | Ht 59.0 in | Wt 175.0 lb

## 2019-03-11 DIAGNOSIS — R000 Tachycardia, unspecified: Secondary | ICD-10-CM

## 2019-03-11 DIAGNOSIS — Z794 Long term (current) use of insulin: Secondary | ICD-10-CM

## 2019-03-11 DIAGNOSIS — E1165 Type 2 diabetes mellitus with hyperglycemia: Secondary | ICD-10-CM

## 2019-03-11 LAB — COMPREHENSIVE METABOLIC PANEL
ALT: 40 U/L — ABNORMAL HIGH (ref 0–35)
AST: 28 U/L (ref 0–37)
Albumin: 4.2 g/dL (ref 3.5–5.2)
Alkaline Phosphatase: 78 U/L (ref 39–117)
BUN: 17 mg/dL (ref 6–23)
CO2: 29 mEq/L (ref 19–32)
Calcium: 9.2 mg/dL (ref 8.4–10.5)
Chloride: 99 mEq/L (ref 96–112)
Creatinine, Ser: 0.69 mg/dL (ref 0.40–1.20)
GFR: 90.07 mL/min (ref >60.00)
Glucose, Bld: 245 mg/dL — ABNORMAL HIGH (ref 70–99)
Potassium: 3.9 mEq/L (ref 3.5–5.1)
Sodium: 134 mEq/L — ABNORMAL LOW (ref 135–145)
Total Bilirubin: 0.6 mg/dL (ref 0.2–1.2)
Total Protein: 7.5 g/dL (ref 6.0–8.3)

## 2019-03-11 LAB — MICROALBUMIN / CREATININE URINE RATIO
Creatinine,U: 146 mg/dL
Microalb Creat Ratio: 3.4 mg/g (ref 0.0–30.0)
Microalb, Ur: 4.9 mg/dL — ABNORMAL HIGH (ref 0.0–1.9)

## 2019-03-11 LAB — HEMOGLOBIN A1C: Hgb A1c MFr Bld: 9.2 % — ABNORMAL HIGH (ref 4.6–6.5)

## 2019-03-11 NOTE — Patient Instructions (Signed)
Medication Instructions:   *If you need a refill on your cardiac medications before your next appointment, please call your pharmacy*  Lab Work:  If you have labs (blood work) drawn today and your tests are completely normal, you will receive your results only by: Marland Kitchen MyChart Message (if you have MyChart) OR . A paper copy in the mail If you have any lab test that is abnormal or we need to change your treatment, we will call you to review the results.  Follow-Up: At [facility], you and your health needs are our priority.  As part of our continuing mission to provide you with exceptional heart care, we have created designated Provider Care Teams.  These Care Teams include your primary Cardiologist (physician) and Advanced Practice Providers (APPs -  Physician Assistants and Nurse Practitioners) who all work together to provide you with the care you need, when you need it.  Your next appointment:   1 year(s)  The format for your next appointment:   In Person  Provider:   You may see Jenkins Rouge, MD or one of the following Advanced Practice Providers on your designated Care Team:    Truitt Merle, NP  Cecilie Kicks, NP  Kathyrn Drown, NP

## 2019-03-16 ENCOUNTER — Other Ambulatory Visit: Payer: Self-pay | Admitting: Endocrinology

## 2019-03-16 ENCOUNTER — Ambulatory Visit: Payer: Self-pay | Admitting: Endocrinology

## 2019-03-16 ENCOUNTER — Encounter: Payer: Self-pay | Admitting: Endocrinology

## 2019-03-16 ENCOUNTER — Other Ambulatory Visit: Payer: Self-pay

## 2019-03-16 VITALS — BP 110/70 | HR 90 | Ht 59.0 in | Wt 176.2 lb

## 2019-03-16 DIAGNOSIS — I10 Essential (primary) hypertension: Secondary | ICD-10-CM

## 2019-03-16 DIAGNOSIS — Z794 Long term (current) use of insulin: Secondary | ICD-10-CM

## 2019-03-16 DIAGNOSIS — E1165 Type 2 diabetes mellitus with hyperglycemia: Secondary | ICD-10-CM

## 2019-03-16 MED ORDER — HUMULIN R U-500 KWIKPEN 500 UNIT/ML ~~LOC~~ SOPN: PEN_INJECTOR | SUBCUTANEOUS | 1 refills | Status: DC

## 2019-03-16 MED ORDER — VICTOZA 18 MG/3ML ~~LOC~~ SOPN: 1.2000 mg | PEN_INJECTOR | Freq: Every day | SUBCUTANEOUS | 3 refills | Status: DC

## 2019-03-16 NOTE — Progress Notes (Signed)
Patient ID: Amy Clarke, female   DOB: 05/26/1970, 49 y.o.   MRN: [id number]           Reason for Appointment:  Follow-up for Type 2 Diabetes  Referring physician: Mayra Neer   History of Present Illness:          Date of diagnosis of type 2 diabetes mellitus: 2003?         Background history:   She had gestational diabetes in 1992 and subsequently was on diet alone She thinks she was started on diabetes medication about 15 years ago and probably took metformin which she took until about a year ago and this was stopped because of diarrhea.  Records show that she was taking 1000 mg of regular metformin twice a day With her PCP she has been on numerous diabetes medications over the years including Byetta, Trulicity, Jardiance and Januvia which apparently all caused side effects, mostly nausea or yeast infections with Vania Rea and Farxiga Her A1c has been mostly higher this year, last year has been as low as 7.2, in January her A1c was 8.4  Recent history:   INSULIN regimen is:  Toujeo 120 units in the morning.  Humalog 80 units twice daily  Non-insulin hypoglycemic drugs the patient is taking are: None, was on Victoza 1.8 mg daily  Her A1c last was 8.1 and now 9.2  Current management, blood sugar patterns and problems identified:  She has been off Synjardy and Victoza  Last year she was having frequent yeast infections despite taking Diflucan and she on her own stopped both Victoza and Synjardy  She was recommended the insulin pump and the Dexcom sensor but her insurance denied both of these  Her highest blood sugars are FASTING despite using 120 units of Toujeo  She has gained 6 pounds in the last 3 months  Her diet is quite variable and she may sometimes eat more carbohydrate and eating 2-3 meals a day  Previously had difficulties with stress eating  Currently not adjusting her Humalog based on what she is eating  Also not clear what her blood sugars are after  dinner  She stopped using the freestyle libre previously because of the sensor not sticking and she has a skin reaction to the armband.  Also with trying it on her abdomen she had falsely low readings  She has had sporadic hypoglycemia, once early morning, once midday and another day at dinnertime.  May get hypoglycemia if she is more active such as shopping        Side effects from medications have been: Diarhea with regular metformin, frequent candidiasis from [place] are 10 AM, 2 PM and 6 PM, may skip lunch or have a snack only  Glucose monitoring:  done  1-2 times a day         Glucometer: One Touch ultra 2 mostly  Blood Glucose readings by monitor download    PRE-MEAL Fasting Lunch Dinner Bedtime Overall  Glucose range: 138-311  51-228  60-271    Mean/median:  210    168   POST-MEAL PC Breakfast PC Lunch PC Dinner  Glucose range:    247  Mean/median:      Previous readings:  PRE-MEAL Fasting Lunch Dinner Bedtime Overall  Glucose range:  146-229   171, 189  258   Mean/median:        POST-MEAL PC Breakfast PC Lunch PC Dinner  Glucose range:    169-267  Mean/median:  Self-care: The diet that the patient has been following is: tries to limit high-fat foods and drinks with sugar .     Typical meal intake: Breakfast is sometimes cereal/bagel/granola Bar.  Usually eating breakfast only if she is working  [employer] usually yogurt, cheese, fruit and crackers.  Dinner chicken with vegetables.  She will have snacks with granola bar or popcorn                Dietician visit, most recent: Several years ago in class                Weight history:  Wt Readings from Last 3 Encounters:  03/16/19 176 lb 3.2 oz (79.9 kg)  03/11/19 175 lb (79.4 kg)  12/10/18 170 lb (77.1 kg)    Glycemic control:   Lab Results  Component Value Date   HGBA1C 9.2 (H) 03/11/2019   HGBA1C 8.1 (H) 08/29/2018   HGBA1C 9.7 (H) 05/08/2018   Lab Results  Component Value Date    MICROALBUR 4.9 (H) 03/11/2019   CREATININE 0.69 03/11/2019   Lab Results  Component Value Date   MICRALBCREAT 3.4 03/11/2019    Lab Results  Component Value Date   FRUCTOSAMINE 278 10/31/2018   FRUCTOSAMINE 265 07/18/2018   FRUCTOSAMINE 372 (H) 09/10/2017      Allergies as of 03/16/2019      Reactions   Farxiga [dapagliflozin] Other (See Comments)   YEAST INFECTION   Januvia [sitagliptin] Diarrhea, Nausea And Vomiting   Lisinopril Cough   Tanzeum [albiglutide] Nausea And Vomiting   Trulicity [dulaglutide] Nausea Only   Sulfonamide Derivatives Rash      Medication List       Accurate as of March 16, 2019  4:50 PM. If you have any questions, ask your nurse or doctor.        STOP taking these medications   Dexcom G6 Receiver Devi Stopped by: Elayne Snare, MD   Dexcom G6 Sensor Misc Stopped by: Elayne Snare, MD   Dexcom G6 Transmitter Misc Stopped by: Elayne Snare, MD     TAKE these medications   Albuterol Sulfate 108 (90 Base) MCG/ACT Aepb Inhale 2 puffs into the lungs as needed (shortness of breath).   atorvastatin 80 MG tablet Commonly known as: LIPITOR Take 80 mg by mouth every evening.   budesonide-formoterol 160-4.5 MCG/ACT inhaler Commonly known as: SYMBICORT Inhale 2 puffs into the lungs 2 (two) times daily as needed (asthma).   buPROPion 150 MG 24 hr tablet Commonly known as: WELLBUTRIN XL Take 150 mg by mouth daily.   ezetimibe 10 MG tablet Commonly known as: Zetia Take 1 tablet (10 mg total) by mouth daily.   fluticasone 50 MCG/ACT nasal spray Commonly known as: FLONASE SPRAY 2 SPRAYS INTO EACH NOSTRIL EVERY DAY   glucose blood test strip Commonly known as: ONE TOUCH ULTRA TEST Use as instructed to test 3 times daily   HumuLIN R U-500 KwikPen 500 UNIT/ML kwikpen Generic drug: insulin regular human CONCENTRATED 80 units before meals and bedtime, adjust as directed Started by: Elayne Snare, MD   insulin lispro 100 UNIT/ML KwikPen Commonly  known as: HumaLOG KwikPen Inject 90 units under the skin up to twice daily.   Insulin Pen Needle 31G X 5 MM Misc Use for insulin and Victoza   Magnesium 250 MG Tabs Take 250 mg by mouth daily.   metoprolol tartrate 50 MG tablet Commonly known as: LOPRESSOR Take 1 tablet (50 mg total) by mouth 2 (two) times daily.  omeprazole 20 MG capsule Commonly known as: PRILOSEC Take 20 mg by mouth 2 (two) times daily before a meal.   ondansetron 4 MG tablet Commonly known as: Zofran Take 1 tablet (4 mg total) by mouth every 8 (eight) hours as needed for nausea or vomiting.   rosuvastatin 40 MG tablet Commonly known as: Crestor Take 1 tablet (40 mg total) by mouth daily.   Toujeo SoloStar 300 UNIT/ML Sopn Generic drug: Insulin Glargine (1 Unit Dial) INJECT 120 UNITS INTO THE SKIN DAILY   valsartan 320 MG tablet Commonly known as: DIOVAN Take 320 mg by mouth daily.   venlafaxine XR 150 MG 24 hr capsule Commonly known as: EFFEXOR-XR Take 150 mg by mouth daily with breakfast.   Victoza 18 MG/3ML Sopn Generic drug: liraglutide Inject 0.2 mLs (1.2 mg total) into the skin daily. Inject once daily at the same time Started by: Elayne Snare, MD   vitamin C 500 MG tablet Commonly known as: ASCORBIC ACID Take 500 mg by mouth daily.   Vitamin D (Cholecalciferol) 25 MCG (1000 UT) Caps Take 1,000 Units by mouth daily.       Allergies:  Allergies  Allergen Reactions  . Wilder Glade [Dapagliflozin] Other (See Comments)    YEAST INFECTION   . Januvia [Sitagliptin] Diarrhea and Nausea And Vomiting  . Lisinopril Cough  . Tanzeum [Albiglutide] Nausea And Vomiting  . Trulicity [Dulaglutide] Nausea Only  . Sulfonamide Derivatives Rash    Past Medical History:  Diagnosis Date  . Asthma   . Depression   . Diabetes ([place])   . GERD (gastroesophageal reflux disease)   . Hyperlipidemia   . Hypertension   . Kidney cysts   . Kidney stones   . Migraine headache     Past Surgical History:    Procedure Laterality Date  . LEFT HEART CATH AND CORONARY ANGIOGRAPHY N/A 10/29/2017   Procedure: LEFT HEART CATH AND CORONARY ANGIOGRAPHY;  Surgeon: Martinique, Peter M, MD;  Location: [facility];  Service: Cardiovascular;  Laterality: N/A;  . none      Family History  Problem Relation Age of Onset  . Diabetes Mother   . Diabetes Maternal Grandmother   . Thyroid disease Cousin     Social History:  reports that she has never smoked. She has never used smokeless tobacco. She reports that she does not drink alcohol or use drugs.   Review of Systems   HYPERLIPIDEMIA:  She has mixed hyperlipidemia She has been taking Crestor 40 mg daily because of persistently high LDL Also taking Zetia  Her LDL is below 100, was 42 in August 2020 Triglycerides are last at 258 compared to 286 in 8/20    Lab Results  Component Value Date   CHOL 133 07/18/2018   CHOL 210 (H) 05/08/2018   CHOL 215 (H) 10/28/2017   Lab Results  Component Value Date   HDL 33.90 (L) 07/18/2018   HDL 33.20 (L) 05/08/2018   HDL 29.60 (L) 10/28/2017   No results found for: [facility] Lab Results  Component Value Date   TRIG 286.0 (H) 07/18/2018   TRIG (H) 05/08/2018    451.0 Triglyceride is over 400; calculations on Lipids are invalid.   TRIG 265.0 (H) 10/28/2017   Lab Results  Component Value Date   CHOLHDL 4 07/18/2018   CHOLHDL 6 05/08/2018   CHOLHDL 7 10/28/2017   Lab Results  Component Value Date   LDLDIRECT 79.0 07/18/2018   LDLDIRECT 125.0 05/08/2018   LDLDIRECT 154.0 10/28/2017  Hypertension: Blood pressure has been high since her 65s, managed by PCP with valsartan 320 mg   BP Readings from Last 3 Encounters:  03/16/19 110/70  03/11/19 110/68  12/10/18 112/76   Microalbuminuria: Last microalbumin ratio 73.6, done on 2/20  Most recent eye exam was In 04/2016  Most recent foot exam: 03/2018    Physical Examination:  BP 110/70 (BP Location: Left Arm, Patient Position:  Sitting, Cuff Size: Normal)   Pulse 90   Ht 4\' 11"  (1.499 m)   Wt 176 lb 3.2 oz (79.9 kg)   SpO2 96%   BMI 35.59 kg/m       ASSESSMENT:  Diabetes type 2, uncontrolled with BMI 35  See history of present illness for detailed discussion of current diabetes management, blood sugar patterns and problems identified  Her A1c is now 9.2  She has had insulin resistance and requiring large doses of insulin  Blood sugars and weight may be higher because of her stopping Synjardy and Victoza She still needs about 300 units of insulin a day with is variable sugars including morning readings averaging over 200 She has fair diet but not doing any formal exercise Currently her insurance does not allow insulin pumps Also has not tried U-500 insulin in the past  Recommendations: See below   HYPERTENSION: Blood pressure is controlled  LIPIDS: Previously triglycerides still over 200, needs better diabetes control and weight loss  PLAN:    She will need to switch to Humulin R U-500  Since she has high readings fasting frequently will also need to take a dose at bedtime in addition to mealtime regimen  Most likely she can use 2 injections a day for breakfast and dinner unless she is eating a significant amount of carbohydrate in the afternoon for a lunch meal  Detailed information on dosage adjustment, timing of the injection, differences between this and normal insulin as well as duration of action and precautions discussed.  Patient handout given.  She will start with 80 units at breakfast and suppertime and 70 at bedtime  Also she will adjust her bedtime dose by 10 units weekly until her morning sugars are at target  She can check to see if the guardian sensor is approved  Discussed that insulin pen may be desirable but apparently not covered by insurance  Needs to do some regular exercise also  Reassured her that Victoza does not cause candidiasis and he needs to get back on this  although will need to start at 0.6 mg weekly for 5 days and then 1.2 mg  Also needs more regular follow-up  Follow-up in 2 months      Patient Instructions  Humulin R U-500 insulin: Take 80 units about an hour before the first meal of the day   If eating a lunch meal take 50 units before   Take 80 units before suppertime and also before bedtime or around 9 PM take 70 units  Suppertime dose can be increased by about 5 minutes at a time, until the bedtime blood sugar readings below 180.  Also for increasing your carbohydrate intake at dinnertime you can go up an extra 10 to 15 units  Increase your bedtime dose by 10 units weekly until morning sugars are below 150  Start Victoza 0.6 mg daily in the morning and after 5 days go up to 1.2 mg            Elayne Snare 03/16/2019, 4:50 PM   Note: This office  note was prepared with Estate agent. Any transcriptional errors that result from this process are unintentional.

## 2019-03-16 NOTE — Patient Instructions (Signed)
Humulin R U-500 insulin: Take 80 units about an hour before the first meal of the day   If eating a lunch meal take 50 units before   Take 80 units before suppertime and also before bedtime or around 9 PM take 70 units  Suppertime dose can be increased by about 5 minutes at a time, until the bedtime blood sugar readings below 180.  Also for increasing your carbohydrate intake at dinnertime you can go up an extra 10 to 15 units  Increase your bedtime dose by 10 units weekly until morning sugars are below 150  Start Victoza 0.6 mg daily in the morning and after 5 days go up to 1.2 mg

## 2019-03-17 ENCOUNTER — Telehealth: Payer: Self-pay | Admitting: Nutrition

## 2019-03-17 NOTE — Telephone Encounter (Signed)
Tandem pump request and Dexcom CGM forms faxed on 01/06/19

## 2019-03-20 ENCOUNTER — Telehealth: Payer: Self-pay

## 2019-03-20 NOTE — Telephone Encounter (Signed)
PA initiated via [email or web address] for Victoza 1.2mg  daily injections.    Quillian Quince (Key: [id number]) Victoza 18MG Fayne Mediate pen-injectors   Form [employer] Electronic Prior Authorization Form (2017 NCPDP) Created 2 minutes ago Sent to Plan 1 minute ago Plan Response 1 minute ago Submit Clinical Questions less than a minute ago Determination Wait for Determination Please wait for [employer] 2017 NCPDP to return a determination.

## 2019-03-23 NOTE — Telephone Encounter (Signed)
Received fax from [employer] stating that pt has been approved for Victoza.  Approval is good through 03/19/2020.

## 2019-03-25 LAB — HEPATIC FUNCTION PANEL
ALT: 39 — AB (ref 7–35)
AST: 35 (ref 13–35)
Bilirubin, Total: 0.6

## 2019-03-25 LAB — BASIC METABOLIC PANEL
BUN: 15 (ref 4–21)
CO2: 24 — AB (ref 13–22)
Chloride: 100 (ref 99–108)
Creatinine: 0.6 (ref <=1.1)
Glucose: 276
Potassium: 3.9 (ref 3.4–5.3)
Sodium: 136 — AB (ref 137–147)

## 2019-03-25 LAB — COMPREHENSIVE METABOLIC PANEL
Albumin: 4.4 (ref 3.5–5.0)
Calcium: 16.1 — AB (ref 8.7–10.7)
GFR calc non Af Amer: 100

## 2019-03-25 LAB — LIPID PANEL
Cholesterol: 163 (ref 0–200)
LDL Cholesterol: 71

## 2019-03-25 LAB — VITAMIN B12: Vitamin B-12: 926

## 2019-04-16 ENCOUNTER — Ambulatory Visit: Payer: 59 | Attending: Family

## 2019-04-16 DIAGNOSIS — Z23 Encounter for immunization: Secondary | ICD-10-CM

## 2019-04-16 NOTE — Progress Notes (Signed)
   Covid-19 Vaccination Clinic  Name:  Amy Clarke    MRN: [id number] DOB: [birth year]  04/16/2019  [name] was observed post Covid-19 immunization for 15 minutes without incident. She was provided with Vaccine Information Sheet and instruction to access the V-Safe system.   [name] was instructed to call 911 with any severe reactions post vaccine: Marland Kitchen Difficulty breathing  . Swelling of face and throat  . A fast heartbeat  . A bad rash all over body  . Dizziness and weakness   Immunizations Administered    Name Date Dose VIS Date Route   Moderna COVID-19 Vaccine 04/16/2019  1:25 PM 0.5 mL 12/30/2018 Intramuscular   Manufacturer: Moderna   Lot: [id number]   [id number]

## 2019-04-27 ENCOUNTER — Other Ambulatory Visit: Payer: Self-pay

## 2019-04-28 ENCOUNTER — Encounter: Payer: Self-pay | Admitting: Endocrinology

## 2019-04-28 ENCOUNTER — Ambulatory Visit: Payer: 59 | Admitting: Endocrinology

## 2019-04-28 VITALS — BP 110/70 | HR 90 | Ht 59.0 in | Wt 177.6 lb

## 2019-04-28 DIAGNOSIS — E1165 Type 2 diabetes mellitus with hyperglycemia: Secondary | ICD-10-CM | POA: Diagnosis not present

## 2019-04-28 DIAGNOSIS — I10 Essential (primary) hypertension: Secondary | ICD-10-CM

## 2019-04-28 DIAGNOSIS — Z794 Long term (current) use of insulin: Secondary | ICD-10-CM

## 2019-04-28 DIAGNOSIS — E782 Mixed hyperlipidemia: Secondary | ICD-10-CM | POA: Diagnosis not present

## 2019-04-28 MED ORDER — VICTOZA 18 MG/3ML ~~LOC~~ SOPN: 1.2000 mg | PEN_INJECTOR | Freq: Every day | SUBCUTANEOUS | 3 refills | Status: DC

## 2019-04-28 NOTE — Patient Instructions (Signed)
Victoza 0.6 for to days and then 0.9mg  if ok  65 R insulin at bedtime  May take extra for hi sugars  Check blood sugars on waking up days a week  Also check blood sugars about 2 hours after meals and do this after different meals by rotation  Recommended blood sugar levels on waking up are 90-130 and about 2 hours after meal is 130-180  Please bring your blood sugar monitor to each visit, thank you

## 2019-04-28 NOTE — Progress Notes (Signed)
Patient ID: Amy Clarke, female   DOB: 01-24-1971, 49 y.o.   MRN: [id number]           Reason for Appointment:  Follow-up for Type 2 Diabetes  Referring physician: Mayra Neer   History of Present Illness:          Date of diagnosis of type 2 diabetes mellitus: 2003?         Background history:   She had gestational diabetes in 1992 and subsequently was on diet alone She thinks she was started on diabetes medication about 15 years ago and probably took metformin which she took until about a year ago and this was stopped because of diarrhea.  Records show that she was taking 1000 mg of regular metformin twice a day With her PCP she has been on numerous diabetes medications over the years including Byetta, Trulicity, Jardiance and Januvia which apparently all caused side effects, mostly nausea or yeast infections with Vania Rea and Farxiga Her A1c has been mostly higher this year, last year has been as low as 7.2, in January her A1c was 8.4  Recent history:   INSULIN regimen is:  Toujeo 120 units in the morning.  Humulin R U-500 80 in the morning and 70 at bedtime   Non-insulin hypoglycemic drugs the patient is taking are: None, was on Victoza 1.8 mg daily  Her A1c last was  9.2 No recent fructosamine available  Current management, blood sugar patterns and problems identified:  She has been off Victoza even though she was supposed to start on the last visit.  She is afraid of side effects and not clear if she had nausea with this although she did take it for quite some time  FASTING blood sugars previously were significantly higher than they are now much better with adding the U-500 insulin at bedtime  Occasionally has had early morning or overnight mild hypoglycemia but she thinks this is more when she is eating more salads in the evening  She says she is trying to watch her diet better not able to lose weight  Also trying to walk regularly  However she still tries to  take some Humalog when she is eating carbohydrates between 50-90 units on top of taking the other 2 insulin doses  She may also take extra insulin when the blood sugars are high causing her blood sugar to occasionally low  She tends to overreact to low blood sugars, sometimes will eat foods like pop tart and does not try to use juice.  She finds regular soft drinks to sweet  She is not checking blood sugars enough after meals to help adjust her insulin and difficult to get an idea how consistently she is getting high readings  High blood sugar about 3 weeks ago at 382 at night        Side effects from medications have been: Diarhea with regular metformin, frequent candidiasis from [place] are 10 AM, 2 PM and 6 PM, may skip lunch or have a snack only  Glucose monitoring:  done  1-2 times a day         Glucometer: One Touch ultra 2 mostly  Blood Glucose readings by monitor download   PRE-MEAL Fasting Lunch Dinner  overnight Overall  Glucose range:  85-122   183  65, 159   Mean/median:     160   POST-MEAL PC Breakfast PC Lunch PC Dinner  Glucose range:  102-305  64  102, 247  Mean/median:      Previous readings:  PRE-MEAL Fasting Lunch Dinner Bedtime Overall  Glucose range: 138-311  51-228  60-271    Mean/median:  210    168   POST-MEAL PC Breakfast PC Lunch PC Dinner  Glucose range:    247  Mean/median:      She stopped using the freestyle libre because she has difficulty with the sensor sticking and she has a skin reaction to the armband. Also with trying it on her abdomen she had falsely low readings    Self-care: The diet that the patient has been following is: tries to limit high-fat foods and drinks with sugar .     Typical meal intake: Breakfast is sometimes cereal/bagel/granola Bar.  Usually eating breakfast only if she is working  [employer] usually yogurt, cheese, fruit and crackers.  Dinner chicken with vegetables.  She will have snacks with granola bar or  popcorn                Dietician visit, most recent: Several years ago in class                Weight history:  Wt Readings from Last 3 Encounters:  04/28/19 177 lb 9.6 oz (80.6 kg)  03/16/19 176 lb 3.2 oz (79.9 kg)  03/11/19 175 lb (79.4 kg)    Glycemic control:   Lab Results  Component Value Date   HGBA1C 9.2 (H) 03/11/2019   HGBA1C 8.1 (H) 08/29/2018   HGBA1C 9.7 (H) 05/08/2018   Lab Results  Component Value Date   MICROALBUR 4.9 (H) 03/11/2019   LDLCALC 71 03/25/2019   CREATININE 0.6 03/25/2019   Lab Results  Component Value Date   MICRALBCREAT 3.4 03/11/2019    Lab Results  Component Value Date   FRUCTOSAMINE 278 10/31/2018   FRUCTOSAMINE 265 07/18/2018   FRUCTOSAMINE 372 (H) 09/10/2017      Allergies as of 04/28/2019      Reactions   Farxiga [dapagliflozin] Other (See Comments)   YEAST INFECTION   Januvia [sitagliptin] Diarrhea, Nausea And Vomiting   Lisinopril Cough   Tanzeum [albiglutide] Nausea And Vomiting   Trulicity [dulaglutide] Nausea Only   Sulfonamide Derivatives Rash      Medication List       Accurate as of April 28, 2019  1:03 PM. If you have any questions, ask your nurse or doctor.        Albuterol Sulfate 108 (90 Base) MCG/ACT Aepb Inhale 2 puffs into the lungs as needed (shortness of breath).   atorvastatin 80 MG tablet Commonly known as: LIPITOR Take 80 mg by mouth every evening.   budesonide-formoterol 160-4.5 MCG/ACT inhaler Commonly known as: SYMBICORT Inhale 2 puffs into the lungs 2 (two) times daily as needed (asthma).   buPROPion 150 MG 24 hr tablet Commonly known as: WELLBUTRIN XL Take 150 mg by mouth daily.   ezetimibe 10 MG tablet Commonly known as: Zetia Take 1 tablet (10 mg total) by mouth daily.   fluticasone 50 MCG/ACT nasal spray Commonly known as: FLONASE SPRAY 2 SPRAYS INTO EACH NOSTRIL EVERY DAY   glucose blood test strip Commonly known as: ONE TOUCH ULTRA TEST Use as instructed to test 3 times  daily   HumuLIN R U-500 KwikPen 500 UNIT/ML kwikpen Generic drug: insulin regular human CONCENTRATED 80 units before meals and bedtime, adjust as directed   InPen 100-Pink-Lilly Devi Generic drug: injection device for insulin CONSULT MD FOR APP SETTINGS.   insulin lispro 100 UNIT/ML  KwikPen Commonly known as: HumaLOG KwikPen Inject 90 units under the skin up to twice daily.   Insulin Pen Needle 31G X 5 MM Misc Use for insulin and Victoza   Magnesium 250 MG Tabs Take 250 mg by mouth daily.   metoprolol tartrate 50 MG tablet Commonly known as: LOPRESSOR Take 1 tablet (50 mg total) by mouth 2 (two) times daily.   omeprazole 20 MG capsule Commonly known as: PRILOSEC Take 20 mg by mouth 2 (two) times daily before a meal.   ondansetron 4 MG tablet Commonly known as: Zofran Take 1 tablet (4 mg total) by mouth every 8 (eight) hours as needed for nausea or vomiting.   rosuvastatin 40 MG tablet Commonly known as: Crestor Take 1 tablet (40 mg total) by mouth daily.   Toujeo SoloStar 300 UNIT/ML Solostar Pen Generic drug: insulin glargine (1 Unit Dial) INJECT 120 UNITS INTO THE SKIN DAILY   valsartan 320 MG tablet Commonly known as: DIOVAN Take 320 mg by mouth daily.   venlafaxine XR 150 MG 24 hr capsule Commonly known as: EFFEXOR-XR Take 150 mg by mouth daily with breakfast.   Victoza 18 MG/3ML Sopn Generic drug: liraglutide Inject 0.2 mLs (1.2 mg total) into the skin daily. Inject once daily at the same time   vitamin C 500 MG tablet Commonly known as: ASCORBIC ACID Take 500 mg by mouth daily.   Vitamin D (Cholecalciferol) 25 MCG (1000 UT) Caps Take 1,000 Units by mouth daily.       Allergies:  Allergies  Allergen Reactions  . Wilder Glade [Dapagliflozin] Other (See Comments)    YEAST INFECTION   . Januvia [Sitagliptin] Diarrhea and Nausea And Vomiting  . Lisinopril Cough  . Tanzeum [Albiglutide] Nausea And Vomiting  . Trulicity [Dulaglutide] Nausea Only  .  Sulfonamide Derivatives Rash    Past Medical History:  Diagnosis Date  . Asthma   . Depression   . Diabetes ([place])   . GERD (gastroesophageal reflux disease)   . Hyperlipidemia   . Hypertension   . Kidney cysts   . Kidney stones   . Migraine headache     Past Surgical History:  Procedure Laterality Date  . LEFT HEART CATH AND CORONARY ANGIOGRAPHY N/A 10/29/2017   Procedure: LEFT HEART CATH AND CORONARY ANGIOGRAPHY;  Surgeon: Martinique, Peter M, MD;  Location: [facility];  Service: Cardiovascular;  Laterality: N/A;  . none      Family History  Problem Relation Age of Onset  . Diabetes Mother   . Diabetes Maternal Grandmother   . Thyroid disease Cousin     Social History:  reports that she has never smoked. She has never used smokeless tobacco. She reports that she does not drink alcohol or use drugs.   Review of Systems   HYPERLIPIDEMIA:  She has mixed hyperlipidemia She has been taking Crestor 40 mg daily because of persistently high LDL Also taking Zetia  Labs as below including results from PCP, triglycerides 369    Lab Results  Component Value Date   CHOL 163 03/25/2019   CHOL 133 07/18/2018   CHOL 210 (H) 05/08/2018   Lab Results  Component Value Date   HDL 33.90 (L) 07/18/2018   HDL 33.20 (L) 05/08/2018   HDL 29.60 (L) 10/28/2017   Lab Results  Component Value Date   LDLCALC 71 03/25/2019   Lab Results  Component Value Date   TRIG 286.0 (H) 07/18/2018   TRIG (H) 05/08/2018    451.0 Triglyceride is  over 400; calculations on Lipids are invalid.   TRIG 265.0 (H) 10/28/2017   Lab Results  Component Value Date   CHOLHDL 4 07/18/2018   CHOLHDL 6 05/08/2018   CHOLHDL 7 10/28/2017   Lab Results  Component Value Date   LDLDIRECT 79.0 07/18/2018   LDLDIRECT 125.0 05/08/2018   LDLDIRECT 154.0 10/28/2017            Hypertension: Blood pressure has been high since her 13s, managed by PCP with valsartan 320 mg   BP Readings from Last 3  Encounters:  04/28/19 110/70  03/16/19 110/70  03/11/19 110/68   Microalbuminuria: Last microalbumin ratio 73.6, done on 2/20  Most recent eye exam was In 04/2016  Most recent foot exam: 03/2018    Physical Examination:  BP 110/70   Pulse 90   Ht 4\' 11"  (1.499 m)   Wt 177 lb 9.6 oz (80.6 kg)   SpO2 98%   BMI 35.87 kg/m       ASSESSMENT:  Diabetes type 2, uncontrolled with BMI 35  See history of present illness for detailed discussion of current diabetes management, blood sugar patterns and problems identified  Her A1c is last 9.2  She has had insulin resistance and requiring large doses of insulin  Currently only on large doses of insulin With adding U-500 insulin her fasting blood sugars are much better However she still has sporadic high readings after meals when she is getting some carbohydrates She tries to take Humalog in addition but not clear how often she is doing this Occasionally will overtreat high blood sugars with Humalog and she is not clear on how to use this She is also overtreating occasional hypoglycemia that she has had either overnight or once in the afternoon Not losing weight despite trying a low carbohydrate diet  Recommendations: See below   HYPERTENSION: Blood pressure is controlled    PLAN:    She will need to use a GLP-1 to help postprandial hyperglycemia and help her with some weight loss and long-term benefits including cardiovascular risk reduction  Although she is very reluctant to do this because of fear of nausea discussed that it may be okay to use even a small dose  She will start with 0.6 mg for up to 2 weeks and if no nausea go up to 0.6 mg +5 clicks on the pen which was shown to her  She needs to reduce her bedtime regular insulin down to 65 units instead of 70 to avoid low sugars in the morning  She is going to use Humalog for carbohydrate intake with a 1: 1 carbohydrate ratio  All she needs to avoid taking Humalog  after meals to correct high readings  Also she can try taking 20 to 40 units of U-500 insulin at lunchtime if she is planning to eat low carbohydrate  No change in [employer] checking blood sugars regularly after meals especially at night  Consider freestyle libre, not apparently had difficulty getting Dexcom covered   Follow-up in 2 months     There are no Patient Instructions on file for this visit.          Elayne Snare 04/28/2019, 1:03 PM   Note: This office note was prepared with Dragon voice recognition system technology. Any transcriptional errors that result from this process are unintentional.

## 2019-05-19 ENCOUNTER — Ambulatory Visit: Payer: 59 | Attending: Family

## 2019-05-19 DIAGNOSIS — Z23 Encounter for immunization: Secondary | ICD-10-CM

## 2019-05-19 NOTE — Progress Notes (Signed)
   Covid-19 Vaccination Clinic  Name:  Amy Clarke    MRN: [id number] DOB: 10-11-1970  05/19/2019  [name] was observed post Covid-19 immunization for 15 minutes without incident. She was provided with Vaccine Information Sheet and instruction to access the V-Safe system.   [name] was instructed to call 911 with any severe reactions post vaccine: Marland Kitchen Difficulty breathing  . Swelling of face and throat  . A fast heartbeat  . A bad rash all over body  . Dizziness and weakness   Immunizations Administered    Name Date Dose VIS Date Route   Moderna COVID-19 Vaccine 05/19/2019  4:18 PM 0.5 mL 12/2018 Intramuscular   Manufacturer: Moderna   Lot: [id number]   [id number]

## 2019-06-17 ENCOUNTER — Other Ambulatory Visit: Payer: Self-pay | Admitting: Endocrinology

## 2019-06-30 ENCOUNTER — Other Ambulatory Visit: Payer: 59

## 2019-07-02 ENCOUNTER — Ambulatory Visit: Payer: 59 | Admitting: Endocrinology

## 2019-07-31 ENCOUNTER — Other Ambulatory Visit: Payer: Self-pay | Admitting: Endocrinology

## 2019-09-21 DIAGNOSIS — M26609 Unspecified temporomandibular joint disorder, unspecified side: Secondary | ICD-10-CM | POA: Diagnosis not present

## 2019-09-21 DIAGNOSIS — H6590 Unspecified nonsuppurative otitis media, unspecified ear: Secondary | ICD-10-CM | POA: Diagnosis not present

## 2019-09-21 DIAGNOSIS — J0100 Acute maxillary sinusitis, unspecified: Secondary | ICD-10-CM | POA: Diagnosis not present

## 2019-09-29 IMAGING — DX DG CHEST 2V
2 series · 2 of 2 positions shown · non-contrast
Comparison: 06/22/2015

CLINICAL DATA: DOMBROWSKI with mid chest tightness , x 6 months, SOB has
increased over the past 2 months, asthma, HTN

EXAM:
CHEST - 2 VIEW

[dg chest 2 view (1 of 2)]
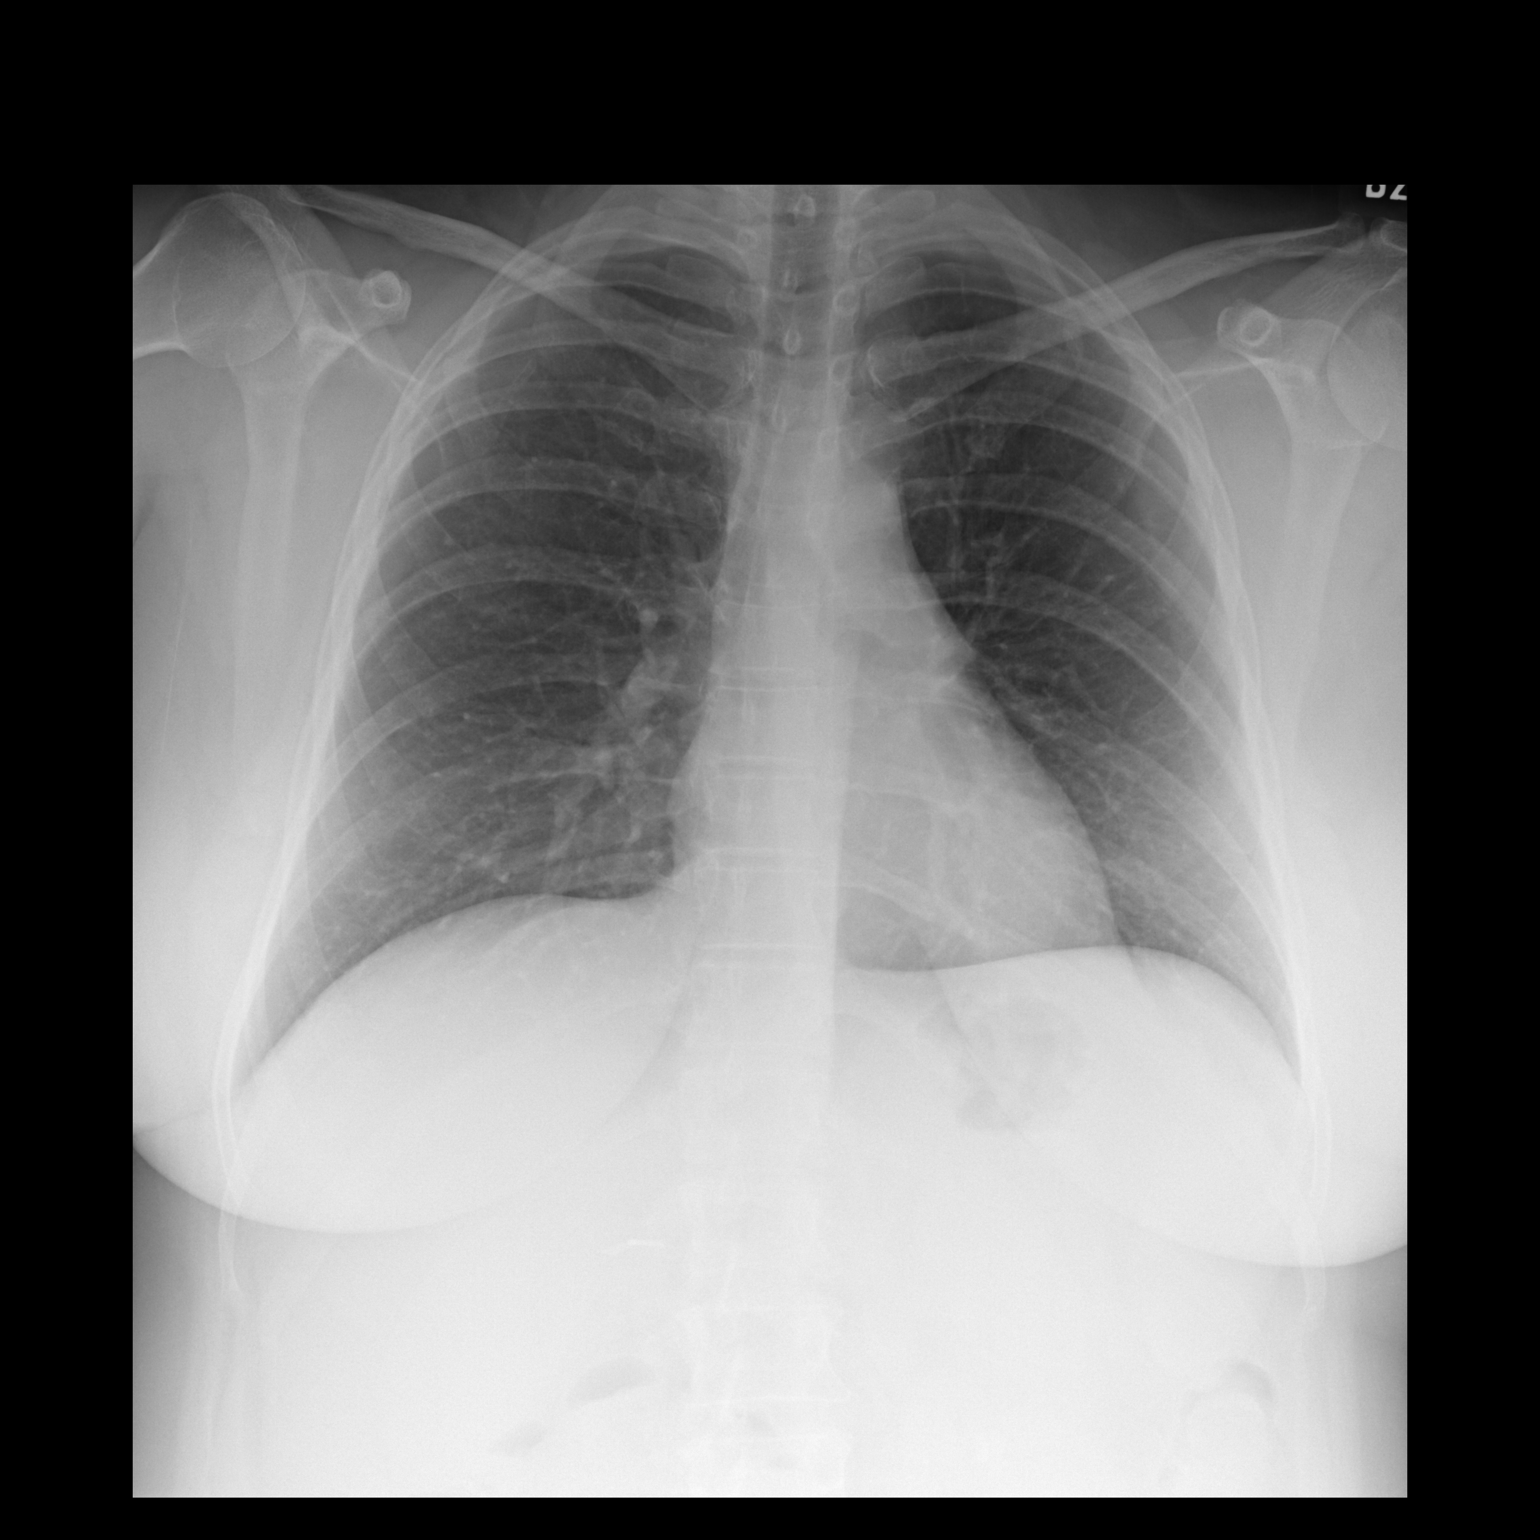

[dg chest 2 view (2 of 2)]
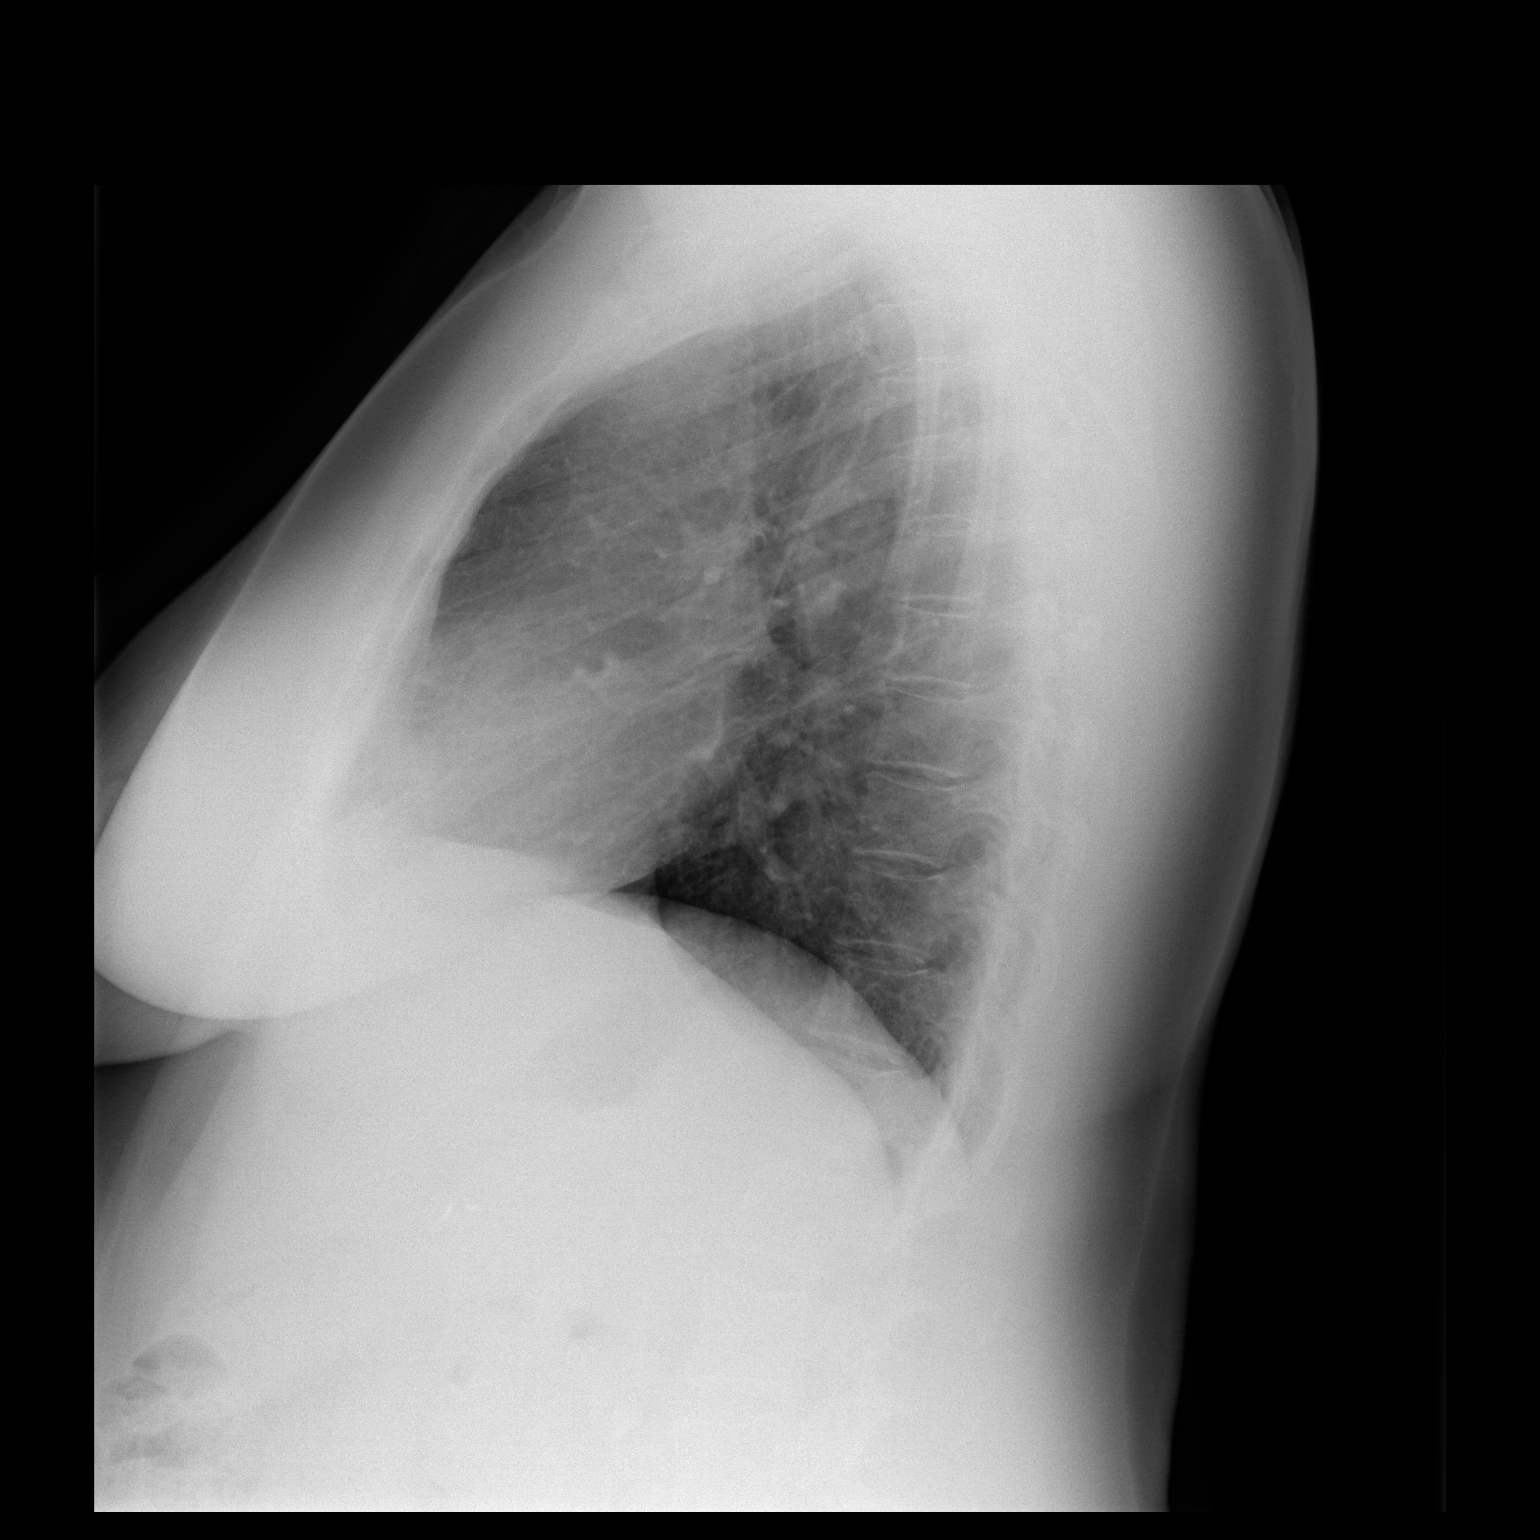

[2 of 2 positions shown; findings below may reference images not displayed]

FINDINGS: The heart size and mediastinal contours are within normal limits.
Both lungs are clear. No pleural effusion or pneumothorax. The
visualized skeletal structures are unremarkable.
IMPRESSION: No active cardiopulmonary disease.

## 2019-10-06 ENCOUNTER — Telehealth: Payer: Self-pay | Admitting: Endocrinology

## 2019-10-06 NOTE — Telephone Encounter (Signed)
She is overdue for follow-up appointment.  Please schedule with labs. If she has enough insulin until next visit we can wait

## 2019-10-06 NOTE — Telephone Encounter (Signed)
Pt is out of her fast acting insulin, pt is not sure of the name at this moment  Pt is set up for FU and Labs

## 2019-10-06 NOTE — Telephone Encounter (Signed)
Please send prescription for NovoLog FlexPen, 1 box with same doses as Humalog

## 2019-10-06 NOTE — Telephone Encounter (Signed)
Received [facility] PA needed paperwork for Humalog  Do you want to do the PA or change the medication?

## 2019-10-07 ENCOUNTER — Other Ambulatory Visit: Payer: Self-pay

## 2019-10-07 ENCOUNTER — Other Ambulatory Visit (INDEPENDENT_AMBULATORY_CARE_PROVIDER_SITE_OTHER): Payer: BC Managed Care – PPO

## 2019-10-07 DIAGNOSIS — E1165 Type 2 diabetes mellitus with hyperglycemia: Secondary | ICD-10-CM | POA: Diagnosis not present

## 2019-10-07 DIAGNOSIS — Z794 Long term (current) use of insulin: Secondary | ICD-10-CM | POA: Diagnosis not present

## 2019-10-07 DIAGNOSIS — E782 Mixed hyperlipidemia: Secondary | ICD-10-CM

## 2019-10-07 LAB — BASIC METABOLIC PANEL
BUN: 12 mg/dL (ref 6–23)
CO2: 30 mEq/L (ref 19–32)
Calcium: 9.6 mg/dL (ref 8.4–10.5)
Chloride: 102 mEq/L (ref 96–112)
Creatinine, Ser: 0.55 mg/dL (ref 0.40–1.20)
GFR: 116.74 mL/min (ref >60.00)
Glucose, Bld: 83 mg/dL (ref 70–99)
Potassium: 3.7 mEq/L (ref 3.5–5.1)
Sodium: 140 mEq/L (ref 135–145)

## 2019-10-07 LAB — LIPID PANEL
Cholesterol: 220 mg/dL — ABNORMAL HIGH (ref 0–200)
HDL: 30.6 mg/dL — ABNORMAL LOW (ref >39.00)
Total CHOL/HDL Ratio: 7
Triglycerides: 477 mg/dL — ABNORMAL HIGH (ref 0.0–149.0)

## 2019-10-07 LAB — LDL CHOLESTEROL, DIRECT: Direct LDL: 138 mg/dL

## 2019-10-07 LAB — HEMOGLOBIN A1C: Hgb A1c MFr Bld: 9 % — ABNORMAL HIGH (ref 4.6–6.5)

## 2019-10-07 MED ORDER — NOVOLOG FLEXPEN 100 UNIT/ML ~~LOC~~ SOPN: PEN_INJECTOR | SUBCUTANEOUS | 0 refills | Status: DC

## 2019-10-07 NOTE — Telephone Encounter (Signed)
Rx sent 

## 2019-10-08 NOTE — Progress Notes (Signed)
Patient ID: Amy Clarke, female   DOB: 14-May-1970, 49 y.o.   MRN: [id number]           Reason for Appointment:  Follow-up for Type 2 Diabetes  Referring physician: Mayra Neer   History of Present Illness:          Date of diagnosis of type 2 diabetes mellitus: 2003?         Background history:   She had gestational diabetes in 1992 and subsequently was on diet alone She thinks she was started on diabetes medication about 15 years ago and probably took metformin which she took until about a year ago and this was stopped because of diarrhea.  Records show that she was taking 1000 mg of regular metformin twice a day With her PCP she has been on numerous diabetes medications over the years including Byetta, Trulicity, Jardiance and Januvia which apparently all caused side effects, mostly nausea or yeast infections with Vania Rea and Farxiga Her A1c has been mostly higher this year, last year has been as low as 7.2, in January her A1c was 8.4  Recent history:   INSULIN regimen is:  Toujeo 120 units in the morning.  Humulin R U-500 120 twice daily   Non-insulin hypoglycemic drugs the patient is taking are: None   Current management, blood sugar patterns and problems identified:  Her A1c is still around 9%   She has not been to tolerate Victoza well because of nausea  Also did not come back for follow-up after her visit in March  She is using a new meter that cannot be downloaded and is checking blood sugars very sporadically  She stopped using the freestyle libre because she has difficulty with the sensor sticking  Also she appears to be somewhat confused about how much insulin she takes and when  Likely takes her first injection only when she is eating lunch at variable times and she thinks she may occasionally forget injections also  However she is still taking Toujeo 120 units once a day, generally in the morning, using 2 injections because of the 80 units device he  has  She says she is not exercising because of sinusitis  Again has snacks and sweets regularly but otherwise she thinks she is eating healthier meals  Her weight is up 4 pounds  She will take NovoLog occasionally when blood sugars are over 250       Side effects from medications have been: Diarhea with regular metformin, frequent candidiasis from [place] are 10 AM, 2 PM and 6 PM, may skip breakfast  Glucose monitoring:  done  1-2 times a day         Glucometer: Dario  Blood Glucose readings by monitor download   PRE-MEAL Fasting Lunch  afternoon Bedtime Overall  Glucose range:  81-127  231, 267  141, 175    Mean/median:     ?   POST-MEAL PC Breakfast PC Lunch  8-9 PM  Glucose range:    159, 230  Mean/median:      Previous:  PRE-MEAL Fasting Lunch Dinner  overnight Overall  Glucose range:  85-122   183  65, 159   Mean/median:     160   POST-MEAL PC Breakfast PC Lunch PC Dinner  Glucose range:  102-305  64  102, 247  Mean/median:        Self-care: The diet that the patient has been following is: tries to limit high-fat foods and drinks with  sugar .     Typical meal intake: Breakfast is sometimes cereal/bagel/granola Bar.  Usually eating breakfast only if she is working  [employer] usually yogurt, cheese, fruit and crackers.  Dinner chicken with vegetables.  She will have snacks with granola bar or popcorn                Dietician visit, most recent: Several years ago in class                Weight history:  Wt Readings from Last 3 Encounters:  10/09/19 181 lb 6.4 oz (82.3 kg)  04/28/19 177 lb 9.6 oz (80.6 kg)  03/16/19 176 lb 3.2 oz (79.9 kg)    Glycemic control:   Lab Results  Component Value Date   HGBA1C 9.0 (H) 10/07/2019   HGBA1C 9.2 (H) 03/11/2019   HGBA1C 8.1 (H) 08/29/2018   Lab Results  Component Value Date   MICROALBUR 4.9 (H) 03/11/2019   LDLCALC 71 03/25/2019   CREATININE 0.55 10/07/2019   Lab Results  Component Value Date    MICRALBCREAT 3.4 03/11/2019    Lab Results  Component Value Date   FRUCTOSAMINE 278 10/31/2018   FRUCTOSAMINE 265 07/18/2018   FRUCTOSAMINE 372 (H) 09/10/2017      Allergies as of 10/09/2019      Reactions   Farxiga [dapagliflozin] Other (See Comments)   YEAST INFECTION   Januvia [sitagliptin] Diarrhea, Nausea And Vomiting   Lisinopril Cough   Tanzeum [albiglutide] Nausea And Vomiting   Trulicity [dulaglutide] Nausea Only   Sulfonamide Derivatives Rash      Medication List       Accurate as of October 09, 2019  3:37 PM. If you have any questions, ask your nurse or doctor.        STOP taking these medications   rosuvastatin 40 MG tablet Commonly known as: Crestor Stopped by: Elayne Snare, MD     TAKE these medications   Albuterol Sulfate 108 (90 Base) MCG/ACT Aepb Commonly known as: PROAIR RESPICLICK Inhale 2 puffs into the lungs as needed (shortness of breath).   atorvastatin 80 MG tablet Commonly known as: LIPITOR Take 80 mg by mouth every evening.   budesonide-formoterol 160-4.5 MCG/ACT inhaler Commonly known as: SYMBICORT Inhale 2 puffs into the lungs 2 (two) times daily as needed (asthma).   buPROPion 150 MG 24 hr tablet Commonly known as: WELLBUTRIN XL Take 150 mg by mouth daily.   cyanocobalamin 1000 MCG/ML injection Commonly known as: (VITAMIN B-12) SMARTSIG:Milliliter(s) Injection Once a Month   diclofenac 75 MG EC tablet Commonly known as: VOLTAREN Take 75 mg by mouth 2 (two) times daily.   ezetimibe 10 MG tablet Commonly known as: Zetia Take 1 tablet (10 mg total) by mouth daily.   fluconazole 150 MG tablet Commonly known as: DIFLUCAN Take 150 mg by mouth daily.   fluticasone 50 MCG/ACT nasal spray Commonly known as: FLONASE SPRAY 2 SPRAYS INTO EACH NOSTRIL EVERY DAY   glucose blood test strip Commonly known as: ONE TOUCH ULTRA TEST Use as instructed to test 3 times daily   HumuLIN R U-500 KwikPen 500 UNIT/ML kwikpen Generic drug:  insulin regular human CONCENTRATED INJECT 80 UNITS BEFORE MEALS AND BEDTIME, ADJUST AS DIRECTED   InPen 100-Pink-Lilly Devi Generic drug: injection device for insulin CONSULT MD FOR APP SETTINGS.   Insulin Pen Needle 31G X 5 MM Misc Use for insulin and Victoza   Magnesium 250 MG Tabs Take 250 mg by mouth daily.   metoprolol  tartrate 50 MG tablet Commonly known as: LOPRESSOR Take 1 tablet (50 mg total) by mouth 2 (two) times daily.   NovoLOG FlexPen 100 UNIT/ML FlexPen Generic drug: insulin aspart Inject 90 units under the skin twice daily. **Must be seen for additional refills**.   omeprazole 40 MG capsule Commonly known as: PRILOSEC Take 80 mg by mouth daily. What changed: Another medication with the same name was removed. Continue taking this medication, and follow the directions you see here. Changed by: Elayne Snare, MD   ondansetron 4 MG tablet Commonly known as: Zofran Take 1 tablet (4 mg total) by mouth every 8 (eight) hours as needed for nausea or vomiting.   Toujeo SoloStar 300 UNIT/ML Solostar Pen Generic drug: insulin glargine (1 Unit Dial) INJECT 120 UNITS INTO THE SKIN DAILY   valsartan 320 MG tablet Commonly known as: DIOVAN Take 320 mg by mouth daily.   venlafaxine XR 150 MG 24 hr capsule Commonly known as: EFFEXOR-XR Take 150 mg by mouth daily with breakfast.   Victoza 18 MG/3ML Sopn Generic drug: liraglutide Inject 0.2 mLs (1.2 mg total) into the skin daily. Inject once daily at the same time   vitamin C 500 MG tablet Commonly known as: ASCORBIC ACID Take 500 mg by mouth daily.   Vitamin D (Cholecalciferol) 25 MCG (1000 UT) Caps Take 1,000 Units by mouth daily.       Allergies:  Allergies  Allergen Reactions  . Wilder Glade [Dapagliflozin] Other (See Comments)    YEAST INFECTION   . Januvia [Sitagliptin] Diarrhea and Nausea And Vomiting  . Lisinopril Cough  . Tanzeum [Albiglutide] Nausea And Vomiting  . Trulicity [Dulaglutide] Nausea Only    . Sulfonamide Derivatives Rash    Past Medical History:  Diagnosis Date  . Asthma   . Depression   . Diabetes ([place])   . GERD (gastroesophageal reflux disease)   . Hyperlipidemia   . Hypertension   . Kidney cysts   . Kidney stones   . Migraine headache     Past Surgical History:  Procedure Laterality Date  . LEFT HEART CATH AND CORONARY ANGIOGRAPHY N/A 10/29/2017   Procedure: LEFT HEART CATH AND CORONARY ANGIOGRAPHY;  Surgeon: Martinique, Peter M, MD;  Location: [facility];  Service: Cardiovascular;  Laterality: N/A;  . none      Family History  Problem Relation Age of Onset  . Diabetes Mother   . Diabetes Maternal Grandmother   . Thyroid disease Cousin     Social History:  reports that she has never smoked. She has never used smokeless tobacco. She reports that she does not drink alcohol and does not use drugs.   Review of Systems   HYPERLIPIDEMIA:  She has mixed hyperlipidemia She has been taking Crestor 40 mg daily because of persistently high LDL Also taking Zetia  Labs as below including results from PCP, triglycerides 369    Lab Results  Component Value Date   CHOL 220 (H) 10/07/2019   CHOL 163 03/25/2019   CHOL 133 07/18/2018   Lab Results  Component Value Date   HDL 30.60 (L) 10/07/2019   HDL 33.90 (L) 07/18/2018   HDL 33.20 (L) 05/08/2018   Lab Results  Component Value Date   LDLCALC 71 03/25/2019   Lab Results  Component Value Date   TRIG (H) 10/07/2019    477.0 Triglyceride is over 400; calculations on Lipids are invalid.   TRIG 286.0 (H) 07/18/2018   TRIG (H) 05/08/2018    451.0 Triglyceride  is over 400; calculations on Lipids are invalid.   Lab Results  Component Value Date   CHOLHDL 7 10/07/2019   CHOLHDL 4 07/18/2018   CHOLHDL 6 05/08/2018   Lab Results  Component Value Date   LDLDIRECT 138.0 10/07/2019   LDLDIRECT 79.0 07/18/2018   LDLDIRECT 125.0 05/08/2018            Hypertension: Blood pressure has been high  since her 5s, managed by PCP with valsartan 320 mg   BP Readings from Last 3 Encounters:  10/09/19 122/80  04/28/19 110/70  03/16/19 110/70   Microalbuminuria: Last microalbumin ratio 73.6, done on 2/20  Most recent eye exam was In 04/2016  Most recent foot exam: 03/2018    Physical Examination:  BP 122/80 (BP Location: Left Arm, Patient Position: Sitting, Cuff Size: Large)   Pulse 97   Ht 4\' 11"  (1.499 m)   Wt 181 lb 6.4 oz (82.3 kg)   SpO2 95%   BMI 36.64 kg/m       ASSESSMENT:  Diabetes type 2, uncontrolled with BMI 35  See history of present illness for detailed discussion of current diabetes management, blood sugar patterns and problems identified  Her A1c is last 9.2  She has had insulin resistance and requiring large doses of insulin  Currently blood sugar monitoring at home is inadequate and also not able to get the full note from her new monitor  She mostly has postprandial hyperglycemia but also appears to be not taking her U-500 insulin consistently Fasting readings appear to be low normal with 120 units of Toujeo  She is still not interested in the insulin pump which would be ideal  Also her mealtimes are quite irregular and she is not also controlling her snacks with excess carbohydrates and desserts She can do better with exercise and weight loss also Although she can benefit from GLP-1 drugs and SGLT2 drugs she cannot tolerate these  HYPERLIPIDEMIA: Lipids are poorly controlled and triglycerides are significantly higher She is not taking Zetia as directed and needs to restart, currently only on atorvastatin from her PCP LDL needs to be at least below 100  HYPERTENSION: Blood pressure is controlled    PLAN:    More regular follow-up  She will start using the Dexcom for more consistent monitoring of blood sugars and hopefully better compliance with her insulin  He needs to take her U-500 insulin on waking up regardless of whether she is  eating or not  Also needs to take her evening dose 30 minutes before eating  Reduce Toujeo by 10 units since fasting readings are low normal  She will check postprandial readings consistently  Regular walking for exercise  Low-fat diet   For her elevated LDL restart Zetia  Start fenofibrate 145 mg daily for high triglycerides   Follow-up in 2 months     Patient Instructions  Take 110 units TOUJEO  Humulin R 120 in am and also 120, 30 min before dinner  Check blood sugars on waking up 5 days a week  Also check blood sugars about 2 hours after meals and do this after different meals by rotation  Recommended blood sugar levels on waking up are 90-130 and about 2 hours after meal is 130-160  Please bring your blood sugar monitor to each visit, thank you             Elayne Snare 10/09/2019, 3:37 PM   Note: This office note was prepared with Dragon voice recognition system  technology. Any transcriptional errors that result from this process are unintentional.

## 2019-10-09 ENCOUNTER — Encounter: Payer: Self-pay | Admitting: Endocrinology

## 2019-10-09 ENCOUNTER — Other Ambulatory Visit: Payer: Self-pay | Admitting: *Deleted

## 2019-10-09 ENCOUNTER — Ambulatory Visit: Payer: BC Managed Care – PPO | Admitting: Endocrinology

## 2019-10-09 ENCOUNTER — Other Ambulatory Visit: Payer: Self-pay

## 2019-10-09 VITALS — BP 122/80 | HR 97 | Ht 59.0 in | Wt 181.4 lb

## 2019-10-09 DIAGNOSIS — E1122 Type 2 diabetes mellitus with diabetic chronic kidney disease: Secondary | ICD-10-CM | POA: Diagnosis not present

## 2019-10-09 DIAGNOSIS — Z794 Long term (current) use of insulin: Secondary | ICD-10-CM | POA: Diagnosis not present

## 2019-10-09 DIAGNOSIS — E782 Mixed hyperlipidemia: Secondary | ICD-10-CM | POA: Diagnosis not present

## 2019-10-09 DIAGNOSIS — E1165 Type 2 diabetes mellitus with hyperglycemia: Secondary | ICD-10-CM

## 2019-10-09 DIAGNOSIS — R319 Hematuria, unspecified: Secondary | ICD-10-CM | POA: Diagnosis not present

## 2019-10-09 DIAGNOSIS — I129 Hypertensive chronic kidney disease with stage 1 through stage 4 chronic kidney disease, or unspecified chronic kidney disease: Secondary | ICD-10-CM | POA: Diagnosis not present

## 2019-10-09 DIAGNOSIS — N181 Chronic kidney disease, stage 1: Secondary | ICD-10-CM | POA: Diagnosis not present

## 2019-10-09 MED ORDER — DEXCOM G6 TRANSMITTER MISC: 0 refills | Status: DC

## 2019-10-09 MED ORDER — DEXCOM G6 RECEIVER DEVI: 0 refills | Status: DC

## 2019-10-09 MED ORDER — FENOFIBRATE 145 MG PO TABS: 145.0000 mg | ORAL_TABLET | Freq: Every day | ORAL | 2 refills | Status: DC

## 2019-10-09 MED ORDER — DEXCOM G6 SENSOR MISC: 2 refills | Status: DC

## 2019-10-09 MED ORDER — EZETIMIBE 10 MG PO TABS
10.0000 mg | ORAL_TABLET | Freq: Every day | ORAL | 3 refills | Status: DC
Start: 2019-10-09 — End: 2020-05-04

## 2019-10-09 NOTE — Telephone Encounter (Signed)
Per provider Dexcom starter kit to be sent to local pharmacy.

## 2019-10-09 NOTE — Patient Instructions (Addendum)
Take 110 units TOUJEO  Humulin R 120 in am and also 120, 30 min before dinner  Check blood sugars on waking up 5 days a week  Also check blood sugars about 2 hours after meals and do this after different meals by rotation  Recommended blood sugar levels on waking up are 90-130 and about 2 hours after meal is 130-160  Please bring your blood sugar monitor to each visit, thank you

## 2019-10-10 MED ORDER — TOUJEO MAX SOLOSTAR 300 UNIT/ML ~~LOC~~ SOPN: 110.0000 [IU] | PEN_INJECTOR | Freq: Every day | SUBCUTANEOUS | 1 refills | Status: DC

## 2019-10-12 DIAGNOSIS — J310 Chronic rhinitis: Secondary | ICD-10-CM | POA: Diagnosis not present

## 2019-10-12 DIAGNOSIS — J343 Hypertrophy of nasal turbinates: Secondary | ICD-10-CM | POA: Diagnosis not present

## 2019-10-12 DIAGNOSIS — H6993 Unspecified Eustachian tube disorder, bilateral: Secondary | ICD-10-CM | POA: Insufficient documentation

## 2019-10-12 DIAGNOSIS — H6983 Other specified disorders of Eustachian tube, bilateral: Secondary | ICD-10-CM | POA: Diagnosis not present

## 2019-10-12 DIAGNOSIS — M26623 Arthralgia of bilateral temporomandibular joint: Secondary | ICD-10-CM | POA: Insufficient documentation

## 2019-10-12 DIAGNOSIS — J342 Deviated nasal septum: Secondary | ICD-10-CM | POA: Diagnosis not present

## 2019-10-26 ENCOUNTER — Other Ambulatory Visit (HOSPITAL_COMMUNITY)
Admission: RE | Admit: 2019-10-26 | Discharge: 2019-10-26 | Disposition: A | Discharge status: Home / Self Care | Payer: BC Managed Care – PPO | Source: Ambulatory Visit | Attending: Otolaryngology | Admitting: Otolaryngology

## 2019-10-26 DIAGNOSIS — Z20822 Contact with and (suspected) exposure to covid-19: Secondary | ICD-10-CM | POA: Diagnosis not present

## 2019-10-26 DIAGNOSIS — Z01812 Encounter for preprocedural laboratory examination: Secondary | ICD-10-CM | POA: Insufficient documentation

## 2019-10-26 LAB — SARS CORONAVIRUS 2 (TAT 6-24 HRS): SARS Coronavirus 2: NEGATIVE

## 2019-10-28 ENCOUNTER — Other Ambulatory Visit: Payer: Self-pay

## 2019-10-28 ENCOUNTER — Encounter (HOSPITAL_BASED_OUTPATIENT_CLINIC_OR_DEPARTMENT_OTHER): Payer: Self-pay | Admitting: Otolaryngology

## 2019-10-28 ENCOUNTER — Other Ambulatory Visit: Payer: Self-pay | Admitting: Otolaryngology

## 2019-10-31 ENCOUNTER — Inpatient Hospital Stay (HOSPITAL_COMMUNITY): Admission: RE | Admit: 2019-10-31 | Payer: BC Managed Care – PPO | Source: Ambulatory Visit

## 2019-11-02 ENCOUNTER — Other Ambulatory Visit (HOSPITAL_COMMUNITY)
Admission: RE | Admit: 2019-11-02 | Discharge: 2019-11-02 | Disposition: A | Discharge status: Home / Self Care | Payer: BC Managed Care – PPO | Source: Ambulatory Visit | Attending: Otolaryngology | Admitting: Otolaryngology

## 2019-11-02 ENCOUNTER — Encounter (HOSPITAL_BASED_OUTPATIENT_CLINIC_OR_DEPARTMENT_OTHER)
Admission: RE | Admit: 2019-11-02 | Discharge: 2019-11-02 | Disposition: A | Discharge status: Home / Self Care | Payer: BC Managed Care – PPO | Source: Ambulatory Visit | Attending: Otolaryngology | Admitting: Otolaryngology

## 2019-11-02 DIAGNOSIS — Z01812 Encounter for preprocedural laboratory examination: Secondary | ICD-10-CM | POA: Insufficient documentation

## 2019-11-02 DIAGNOSIS — Z20822 Contact with and (suspected) exposure to covid-19: Secondary | ICD-10-CM | POA: Insufficient documentation

## 2019-11-02 LAB — SARS CORONAVIRUS 2 (TAT 6-24 HRS): SARS Coronavirus 2: NEGATIVE

## 2019-11-02 LAB — BASIC METABOLIC PANEL
Anion gap: 10 (ref 5–15)
BUN: 13 mg/dL (ref 6–20)
CO2: 26 mmol/L (ref 22–32)
Calcium: 9.2 mg/dL (ref 8.9–10.3)
Chloride: 103 mmol/L (ref 98–111)
Creatinine, Ser: 0.6 mg/dL (ref 0.44–1.00)
GFR calc Af Amer: >60 mL/min (ref >60)
GFR calc non Af Amer: >60 mL/min (ref >60)
Glucose, Bld: 153 mg/dL — ABNORMAL HIGH (ref 70–99)
Potassium: 4.4 mmol/L (ref 3.5–5.1)
Sodium: 139 mmol/L (ref 135–145)

## 2019-11-04 ENCOUNTER — Other Ambulatory Visit: Payer: Self-pay

## 2019-11-04 ENCOUNTER — Ambulatory Visit (HOSPITAL_BASED_OUTPATIENT_CLINIC_OR_DEPARTMENT_OTHER): Payer: BC Managed Care – PPO | Admitting: Anesthesiology

## 2019-11-04 ENCOUNTER — Encounter (HOSPITAL_BASED_OUTPATIENT_CLINIC_OR_DEPARTMENT_OTHER)
Admission: RE | Disposition: A | Discharge status: Home / Self Care | Payer: Self-pay | Source: Home / Self Care | Attending: Otolaryngology

## 2019-11-04 ENCOUNTER — Encounter (HOSPITAL_BASED_OUTPATIENT_CLINIC_OR_DEPARTMENT_OTHER): Payer: Self-pay | Admitting: Otolaryngology

## 2019-11-04 ENCOUNTER — Ambulatory Visit (HOSPITAL_BASED_OUTPATIENT_CLINIC_OR_DEPARTMENT_OTHER)
Admission: RE | Admit: 2019-11-04 | Discharge: 2019-11-04 | Disposition: A | Discharge status: Home / Self Care | Payer: BC Managed Care – PPO | Attending: Otolaryngology | Admitting: Otolaryngology

## 2019-11-04 DIAGNOSIS — E119 Type 2 diabetes mellitus without complications: Secondary | ICD-10-CM | POA: Diagnosis not present

## 2019-11-04 DIAGNOSIS — Z79899 Other long term (current) drug therapy: Secondary | ICD-10-CM | POA: Diagnosis not present

## 2019-11-04 DIAGNOSIS — J343 Hypertrophy of nasal turbinates: Secondary | ICD-10-CM | POA: Diagnosis not present

## 2019-11-04 DIAGNOSIS — E785 Hyperlipidemia, unspecified: Secondary | ICD-10-CM | POA: Insufficient documentation

## 2019-11-04 DIAGNOSIS — Z882 Allergy status to sulfonamides status: Secondary | ICD-10-CM | POA: Diagnosis not present

## 2019-11-04 DIAGNOSIS — Z9049 Acquired absence of other specified parts of digestive tract: Secondary | ICD-10-CM | POA: Diagnosis not present

## 2019-11-04 DIAGNOSIS — Z90710 Acquired absence of both cervix and uterus: Secondary | ICD-10-CM | POA: Diagnosis not present

## 2019-11-04 DIAGNOSIS — F329 Major depressive disorder, single episode, unspecified: Secondary | ICD-10-CM | POA: Diagnosis not present

## 2019-11-04 DIAGNOSIS — Z7951 Long term (current) use of inhaled steroids: Secondary | ICD-10-CM | POA: Diagnosis not present

## 2019-11-04 DIAGNOSIS — K219 Gastro-esophageal reflux disease without esophagitis: Secondary | ICD-10-CM | POA: Diagnosis not present

## 2019-11-04 DIAGNOSIS — I10 Essential (primary) hypertension: Secondary | ICD-10-CM | POA: Insufficient documentation

## 2019-11-04 DIAGNOSIS — Z833 Family history of diabetes mellitus: Secondary | ICD-10-CM | POA: Insufficient documentation

## 2019-11-04 DIAGNOSIS — Z794 Long term (current) use of insulin: Secondary | ICD-10-CM | POA: Insufficient documentation

## 2019-11-04 DIAGNOSIS — J342 Deviated nasal septum: Secondary | ICD-10-CM

## 2019-11-04 DIAGNOSIS — Z8249 Family history of ischemic heart disease and other diseases of the circulatory system: Secondary | ICD-10-CM | POA: Diagnosis not present

## 2019-11-04 DIAGNOSIS — Z888 Allergy status to other drugs, medicaments and biological substances status: Secondary | ICD-10-CM | POA: Diagnosis not present

## 2019-11-04 DIAGNOSIS — J45909 Unspecified asthma, uncomplicated: Secondary | ICD-10-CM | POA: Diagnosis not present

## 2019-11-04 HISTORY — DX: Cardiac arrhythmia, unspecified: I49.9

## 2019-11-04 HISTORY — DX: Other specified postprocedural states: Z98.890

## 2019-11-04 HISTORY — DX: Personal history of urinary calculi: Z87.442

## 2019-11-04 HISTORY — PX: NASAL SEPTOPLASTY W/ TURBINOPLASTY: SHX2070

## 2019-11-04 HISTORY — DX: Other complications of anesthesia, initial encounter: T88.59XA

## 2019-11-04 HISTORY — DX: Other specified postprocedural states: R11.2

## 2019-11-04 LAB — GLUCOSE, CAPILLARY
Glucose-Capillary: 70 mg/dL (ref 70–99)
Glucose-Capillary: 95 mg/dL (ref 70–99)

## 2019-11-04 SURGERY — SEPTOPLASTY, NOSE, WITH NASAL TURBINATE REDUCTION
Anesthesia: General | Site: Nose | Laterality: Bilateral

## 2019-11-04 MED ORDER — FENTANYL CITRATE (PF) 100 MCG/2ML IJ SOLN
INTRAMUSCULAR | Status: AC
  Filled 2019-11-04: qty 2

## 2019-11-04 MED ORDER — DEXAMETHASONE SODIUM PHOSPHATE 10 MG/ML IJ SOLN
INTRAMUSCULAR | Status: AC
  Filled 2019-11-04: qty 1

## 2019-11-04 MED ORDER — PHENYLEPHRINE 40 MCG/ML (10ML) SYRINGE FOR IV PUSH (FOR BLOOD PRESSURE SUPPORT)
PREFILLED_SYRINGE | INTRAVENOUS | Status: AC
  Filled 2019-11-04: qty 10

## 2019-11-04 MED ORDER — MUPIROCIN 2 % EX OINT
TOPICAL_OINTMENT | CUTANEOUS | Status: AC
  Filled 2019-11-04: qty 22

## 2019-11-04 MED ORDER — GABAPENTIN 300 MG PO CAPS
ORAL_CAPSULE | ORAL | Status: AC
  Filled 2019-11-04: qty 1

## 2019-11-04 MED ORDER — ROCURONIUM BROMIDE 10 MG/ML (PF) SYRINGE
PREFILLED_SYRINGE | INTRAVENOUS | Status: DC | PRN
  Administered 2019-11-04: 50 mg via INTRAVENOUS

## 2019-11-04 MED ORDER — ONDANSETRON HCL 4 MG/2ML IJ SOLN
INTRAMUSCULAR | Status: DC | PRN
  Administered 2019-11-04: 4 mg via INTRAVENOUS

## 2019-11-04 MED ORDER — OXYMETAZOLINE HCL 0.05 % NA SOLN
NASAL | Status: AC
  Filled 2019-11-04: qty 60

## 2019-11-04 MED ORDER — OXYMETAZOLINE HCL 0.05 % NA SOLN
NASAL | Status: DC | PRN
  Administered 2019-11-04: 1 via TOPICAL

## 2019-11-04 MED ORDER — PROPOFOL 10 MG/ML IV BOLUS
INTRAVENOUS | Status: DC | PRN
  Administered 2019-11-04: 170 mg via INTRAVENOUS

## 2019-11-04 MED ORDER — DEXMEDETOMIDINE (PRECEDEX) IN NS 20 MCG/5ML (4 MCG/ML) IV SYRINGE
PREFILLED_SYRINGE | INTRAVENOUS | Status: AC
  Filled 2019-11-04: qty 5

## 2019-11-04 MED ORDER — EPHEDRINE 5 MG/ML INJ
INTRAVENOUS | Status: AC
  Filled 2019-11-04: qty 10

## 2019-11-04 MED ORDER — FENTANYL CITRATE (PF) 100 MCG/2ML IJ SOLN
INTRAMUSCULAR | Status: DC | PRN
  Administered 2019-11-04 (×2): 50 ug via INTRAVENOUS

## 2019-11-04 MED ORDER — LIDOCAINE 2% (20 MG/ML) 5 ML SYRINGE
INTRAMUSCULAR | Status: DC | PRN
  Administered 2019-11-04: 60 mg via INTRAVENOUS

## 2019-11-04 MED ORDER — AMISULPRIDE (ANTIEMETIC) 5 MG/2ML IV SOLN: 10.0000 mg | Freq: Once | INTRAVENOUS | Status: DC | PRN

## 2019-11-04 MED ORDER — PHENYLEPHRINE 40 MCG/ML (10ML) SYRINGE FOR IV PUSH (FOR BLOOD PRESSURE SUPPORT)
PREFILLED_SYRINGE | INTRAVENOUS | Status: DC | PRN
  Administered 2019-11-04 (×3): 80 ug via INTRAVENOUS

## 2019-11-04 MED ORDER — MIDAZOLAM HCL 2 MG/2ML IJ SOLN
INTRAMUSCULAR | Status: DC | PRN
  Administered 2019-11-04: 2 mg via INTRAVENOUS

## 2019-11-04 MED ORDER — FENTANYL CITRATE (PF) 100 MCG/2ML IJ SOLN: 25.0000 ug | INTRAMUSCULAR | Status: DC | PRN

## 2019-11-04 MED ORDER — ONDANSETRON HCL 4 MG/2ML IJ SOLN
INTRAMUSCULAR | Status: AC
  Filled 2019-11-04: qty 2

## 2019-11-04 MED ORDER — CEFAZOLIN SODIUM-DEXTROSE 2-3 GM-%(50ML) IV SOLR
INTRAVENOUS | Status: DC | PRN
  Administered 2019-11-04: 2 g via INTRAVENOUS

## 2019-11-04 MED ORDER — PROPOFOL 500 MG/50ML IV EMUL
INTRAVENOUS | Status: AC
  Filled 2019-11-04: qty 50

## 2019-11-04 MED ORDER — DEXAMETHASONE SODIUM PHOSPHATE 10 MG/ML IJ SOLN
INTRAMUSCULAR | Status: DC | PRN
  Administered 2019-11-04: 10 mg via INTRAVENOUS

## 2019-11-04 MED ORDER — GABAPENTIN 300 MG PO CAPS
300.0000 mg | ORAL_CAPSULE | Freq: Once | ORAL | Status: AC
  Administered 2019-11-04: 300 mg via ORAL

## 2019-11-04 MED ORDER — ROCURONIUM BROMIDE 10 MG/ML (PF) SYRINGE
PREFILLED_SYRINGE | INTRAVENOUS | Status: AC
  Filled 2019-11-04: qty 10

## 2019-11-04 MED ORDER — LIDOCAINE-EPINEPHRINE 1 %-1:100000 IJ SOLN
INTRAMUSCULAR | Status: AC
  Filled 2019-11-04: qty 2

## 2019-11-04 MED ORDER — SUGAMMADEX SODIUM 200 MG/2ML IV SOLN
INTRAVENOUS | Status: DC | PRN
  Administered 2019-11-04: 200 mg via INTRAVENOUS

## 2019-11-04 MED ORDER — MUPIROCIN 2 % EX OINT
TOPICAL_OINTMENT | CUTANEOUS | Status: DC | PRN
  Administered 2019-11-04: 1 via TOPICAL

## 2019-11-04 MED ORDER — CEFAZOLIN SODIUM-DEXTROSE 2-4 GM/100ML-% IV SOLN
INTRAVENOUS | Status: AC
  Filled 2019-11-04: qty 100

## 2019-11-04 MED ORDER — SCOPOLAMINE 1 MG/3DAYS TD PT72
1.0000 | MEDICATED_PATCH | TRANSDERMAL | Status: DC
  Administered 2019-11-04: 1.5 mg via TRANSDERMAL

## 2019-11-04 MED ORDER — ACETAMINOPHEN 500 MG PO TABS
1000.0000 mg | ORAL_TABLET | Freq: Once | ORAL | Status: AC
  Administered 2019-11-04: 1000 mg via ORAL

## 2019-11-04 MED ORDER — BUPIVACAINE HCL (PF) 0.5 % IJ SOLN
INTRAMUSCULAR | Status: AC
  Filled 2019-11-04: qty 30

## 2019-11-04 MED ORDER — EPHEDRINE SULFATE-NACL 50-0.9 MG/10ML-% IV SOSY
PREFILLED_SYRINGE | INTRAVENOUS | Status: DC | PRN
  Administered 2019-11-04: 10 mg via INTRAVENOUS

## 2019-11-04 MED ORDER — CEPHALEXIN 500 MG PO CAPS: 500.0000 mg | ORAL_CAPSULE | Freq: Three times a day (TID) | ORAL | 0 refills | Status: AC

## 2019-11-04 MED ORDER — MIDAZOLAM HCL 2 MG/2ML IJ SOLN
INTRAMUSCULAR | Status: AC
  Filled 2019-11-04: qty 2

## 2019-11-04 MED ORDER — SCOPOLAMINE 1 MG/3DAYS TD PT72
MEDICATED_PATCH | TRANSDERMAL | Status: AC
  Filled 2019-11-04: qty 1

## 2019-11-04 MED ORDER — LACTATED RINGERS IV SOLN: INTRAVENOUS | Status: DC

## 2019-11-04 MED ORDER — LIDOCAINE 2% (20 MG/ML) 5 ML SYRINGE
INTRAMUSCULAR | Status: AC
  Filled 2019-11-04: qty 5

## 2019-11-04 MED ORDER — ACETAMINOPHEN 500 MG PO TABS
ORAL_TABLET | ORAL | Status: AC
  Filled 2019-11-04: qty 2

## 2019-11-04 MED ORDER — LIDOCAINE-EPINEPHRINE 1 %-1:100000 IJ SOLN
INTRAMUSCULAR | Status: DC | PRN
  Administered 2019-11-04: 7 mL

## 2019-11-04 SURGICAL SUPPLY — 34 items
ATTRACTOMAT 16X20 MAGNETIC DRP (DRAPES) IMPLANT
BLADE SURG 15 STRL LF DISP TIS (BLADE) IMPLANT
BLADE SURG 15 STRL SS (BLADE)
CANISTER SUCT 1200ML W/VALVE (MISCELLANEOUS) ×2 IMPLANT
COAGULATOR SUCT 8FR VV (MISCELLANEOUS) IMPLANT
COVER WAND RF STERILE (DRAPES) IMPLANT
DECANTER SPIKE VIAL GLASS SM (MISCELLANEOUS) IMPLANT
DRSG NASOPORE 8CM (GAUZE/BANDAGES/DRESSINGS) IMPLANT
DRSG TELFA 3X8 NADH (GAUZE/BANDAGES/DRESSINGS) IMPLANT
ELECT REM PT RETURN 9FT ADLT (ELECTROSURGICAL) ×2
ELECTRODE REM PT RTRN 9FT ADLT (ELECTROSURGICAL) ×1 IMPLANT
GLOVE BIOGEL M 7.0 STRL (GLOVE) ×6 IMPLANT
GLOVE BIOGEL PI IND STRL 7.0 (GLOVE) ×1 IMPLANT
GLOVE BIOGEL PI INDICATOR 7.0 (GLOVE) ×1
GOWN STRL REUS W/ TWL LRG LVL3 (GOWN DISPOSABLE) ×1 IMPLANT
GOWN STRL REUS W/ TWL XL LVL3 (GOWN DISPOSABLE) ×1 IMPLANT
GOWN STRL REUS W/TWL LRG LVL3 (GOWN DISPOSABLE) ×2
GOWN STRL REUS W/TWL XL LVL3 (GOWN DISPOSABLE) ×2
IV SET EXT 30 76VOL 4 MALE LL (IV SETS) ×2 IMPLANT
NEEDLE PRECISIONGLIDE 27X1.5 (NEEDLE) ×2 IMPLANT
NS IRRIG 1000ML POUR BTL (IV SOLUTION) ×2 IMPLANT
PACK BASIN DAY SURGERY FS (CUSTOM PROCEDURE TRAY) ×2 IMPLANT
PACK ENT DAY SURGERY (CUSTOM PROCEDURE TRAY) ×2 IMPLANT
SLEEVE SCD COMPRESS KNEE MED (MISCELLANEOUS) ×2 IMPLANT
SPLINT NASAL AIRWAY SILICONE (MISCELLANEOUS) ×2 IMPLANT
SPONGE GAUZE 2X2 8PLY STRL LF (GAUZE/BANDAGES/DRESSINGS) ×2 IMPLANT
SPONGE NEURO XRAY DETECT 1X3 (DISPOSABLE) ×2 IMPLANT
SPONGE SURGIFOAM ABS GEL 12-7 (HEMOSTASIS) IMPLANT
SUT ETHILON 3 0 PS 1 (SUTURE) ×2 IMPLANT
SUT PLAIN 4 0 ~~LOC~~ 1 (SUTURE) ×2 IMPLANT
TOWEL GREEN STERILE FF (TOWEL DISPOSABLE) ×2 IMPLANT
TUBE SALEM SUMP 12R W/ARV (TUBING) IMPLANT
TUBE SALEM SUMP 16 FR W/ARV (TUBING) IMPLANT
YANKAUER SUCT BULB TIP NO VENT (SUCTIONS) ×2 IMPLANT

## 2019-11-04 NOTE — Transfer of Care (Signed)
Immediate Anesthesia Transfer of Care Note  Patient: [name]  Procedure(s) Performed: NASAL SEPTOPLASTY WITH TURBINATE REDUCTION (Bilateral Nose)  Patient Location: PACU  Anesthesia Type:General  Level of Consciousness: awake, alert  and oriented  Airway & Oxygen Therapy: Patient Spontanous Breathing and Patient connected to face mask oxygen  Post-op Assessment: Report given to RN and Post -op Vital signs reviewed and stable  Post vital signs: Reviewed and stable  Last Vitals:  Vitals Value Taken Time  BP 94/64 11/04/19 0938  Temp    Pulse 78 11/04/19 0939  Resp 15 11/04/19 0939  SpO2 97 % 11/04/19 0939  Vitals shown include unvalidated device data.  Last Pain:  Vitals:   11/04/19 0751  TempSrc: Oral  PainSc: 0-No pain         Complications: No complications documented.

## 2019-11-04 NOTE — Op Note (Signed)
Operative Note: SEPTOPLASTY AND INFERIOR TURBINATE REDUCTION  Patient: Amy Clarke  Medical record number: [id number]  Date:11/04/2019  Pre-operative Indications: 1. Deviated nasal septum with nasal airway obstruction     2.  Bilateral inferior turbinate hypertrophy  Postoperative Indications: Same  Surgical Procedure: 1.  Nasal Septoplasty    2.  Bilateral Inferior Turbinate Reduction  Anesthesia: GET  Surgeon: Delsa Bern, M.D.  Complications: None  EBL: 50 cc  Findings: Severely deviated nasal septum with airway obstruction and bilateral inferior turbinate hypertrophy.   Brief History: The patient is a 49 y.o. female with a history of progressive nasal airway obstruction. The patient has been on medical therapy to reduce nasal mucosal edema including saline nasal spray and topical nasal steroids. Despite appropriate medical therapy the patient continues to have ongoing symptoms. Given the patient's history and findings, the above surgical procedures were recommended, risks and benefits were discussed in detail with the patient may understand and agree with our plan for surgery which is scheduled at [facility] under general anesthesia as an outpatient.  Surgical Procedure: The patient is brought to the operating room on 11/04/2019 and placed in supine position on the operating table. General endotracheal anesthesia was established without difficulty. When the patient was adequately anesthetized, surgical timeout was performed and correct identification of the patient and the surgical procedure. The patient's nose was then injected with 7 cc of 1% lidocaine 1 100,000 dilution epinephrine which was injected in a submucosal fashion. The patient's nose was then packed with Afrin-soaked cottonoid pledgets were left in place for approximately 10 minutes lateral vasoconstriction and hemostasis. .  With the patient prepped draped and prepared for surgery, nasal septoplasty was begun.  A  left anterior hemitransfixion incision was created and a mucoperichondrial flap was elevated from anterior to posterior on the left-hand side. The anterior cartilaginous septum was crossed at the midline and a mucoperichondrial flap was elevated on the patient's right.  Swivel knife was then used to resect the anterior and mid cartilaginous portion of the nasal septum.  Resected cartilage was morcellized and returned to the mucoperichondrial pocket at the occlusion of the surgical procedure.  Dissection was then carried out from anterior to posterior removing deviated bone and cartilage including a large septal spur the overlying mucosa was preserved.  The patient had a significant anterior columellar deviation to the right.  A second anterior incision was created isolating the columellar cartilage strut which was then dissected from the surrounding mucoperichondrial pocket.  With cartilage freed I was able to bring it to good midline position and suture it into anatomic alignment with interrupted 4 oh gut suture.  With the septum brought to good midline position, the morselized cartilage was returned to the mucoperichondrial pocket and the soft tissue/mucosal flaps were reapproximated with interrupted 4-0 gut suture on a Keith needle in a horizontal mattressing fashion.  Anterior hemitransfixion incision was closed with the same stitch.  Bilateral Doyle nasal septal splints were then placed after the application of Bactroban ointment and sutured in position with a 3-0 Ethilon suture.  Attention was then turned to the inferior turbinates, bilateral inferior turbinate intramural cautery was performed with cautery setting at 55 W.  2 submucosal passes were made in each inferior turbinate.  After completing cautery, anterior vertical incisions were created and overlying soft tissue was elevated, a small amount of turbinate bone was resected.  The turbinates were then outfractured to create a more patent nasal  passageway.  Surgical sponge count was  correct. An oral gastric tube was passed and the stomach contents were aspirated. Patient was awakened from anesthetic and transferred from the operating room to the recovery room in stable condition. There were no complications and blood loss was 50cc.   Delsa Bern, M.D. [facility] 11/04/2019

## 2019-11-04 NOTE — H&P (Signed)
Amy Clarke is an 49 y.o. female.   Chief Complaint: Septal Deviation and IT Hypertrophy HPI: Hx of Prog nasal airway obstruction  Past Medical History:  Diagnosis Date  . Asthma   . Complication of anesthesia    hard time waking up  . Depression   . Diabetes ([place])   . Dysrhythmia    father died at age 84 due to MI, heart cath in 10/2018, no intervention  . GERD (gastroesophageal reflux disease)   . History of kidney stones   . Hyperlipidemia   . Hypertension   . Kidney cysts   . Kidney stones   . Migraine headache   . PONV (postoperative nausea and vomiting)     Past Surgical History:  Procedure Laterality Date  . ABDOMINAL HYSTERECTOMY    . CARDIAC CATHETERIZATION    . CESAREAN SECTION     x2  . CHOLECYSTECTOMY    . LEFT HEART CATH AND CORONARY ANGIOGRAPHY N/A 10/29/2017   Procedure: LEFT HEART CATH AND CORONARY ANGIOGRAPHY;  Surgeon: Martinique, Peter M, MD;  Location: [facility];  Service: Cardiovascular;  Laterality: N/A;  . LITHOTRIPSY     x3  . none    . TONSILLECTOMY      Family History  Problem Relation Age of Onset  . Diabetes Mother   . Diabetes Maternal Grandmother   . Thyroid disease Cousin    Social History:  reports that she has never smoked. She has never used smokeless tobacco. She reports current alcohol use. She reports that she does not use drugs.  Allergies:  Allergies  Allergen Reactions  . Wilder Glade [Dapagliflozin] Other (See Comments)    YEAST INFECTION   . Januvia [Sitagliptin] Diarrhea and Nausea And Vomiting  . Lisinopril Cough  . Tanzeum [Albiglutide] Nausea And Vomiting  . Trulicity [Dulaglutide] Nausea Only  . Adhesive [Tape] Rash  . Sulfonamide Derivatives Rash    Medications Prior to Admission  Medication Sig Dispense Refill  . Albuterol Sulfate 108 (90 Base) MCG/ACT AEPB Inhale 2 puffs into the lungs as needed (shortness of breath).     . budesonide-formoterol (SYMBICORT) 160-4.5 MCG/ACT inhaler Inhale 2 puffs into  the lungs 2 (two) times daily as needed (asthma).     . cyanocobalamin (,VITAMIN B-12,) 1000 MCG/ML injection SMARTSIG:Milliliter(s) Injection Once a Month    . ezetimibe (ZETIA) 10 MG tablet Take 1 tablet (10 mg total) by mouth daily. 30 tablet 3  . fenofibrate (TRICOR) 145 MG tablet Take 1 tablet (145 mg total) by mouth daily. 30 tablet 2  . fluticasone (FLONASE) 50 MCG/ACT nasal spray SPRAY 2 SPRAYS INTO EACH NOSTRIL EVERY DAY  11  . insulin aspart (NOVOLOG FLEXPEN) 100 UNIT/ML FlexPen Inject 90 units under the skin twice daily. **Must be seen for additional refills**. 15 mL 0  . insulin glargine, 2 Unit Dial, (TOUJEO MAX SOLOSTAR) 300 UNIT/ML Solostar Pen Inject 110 Units into the skin daily. (Patient taking differently: Inject 120 Units into the skin daily. ) 18 mL 1  . insulin regular human CONCENTRATED (HUMULIN R U-500 KWIKPEN) 500 UNIT/ML kwikpen INJECT 80 UNITS BEFORE MEALS AND BEDTIME, ADJUST AS DIRECTED (Patient taking differently: 120 Units. INJECT 80 UNITS BEFORE MEALS AND BEDTIME, ADJUST AS DIRECTED) 30 mL 1  . Magnesium 250 MG TABS Take 250 mg by mouth daily.    . metoprolol tartrate (LOPRESSOR) 50 MG tablet Take 1 tablet (50 mg total) by mouth 2 (two) times daily. 180 tablet 3  . omeprazole (PRILOSEC) 40  MG capsule Take 80 mg by mouth daily.    . valsartan (DIOVAN) 320 MG tablet Take 320 mg by mouth daily.     Marland Kitchen venlafaxine XR (EFFEXOR-XR) 150 MG 24 hr capsule Take 150 mg by mouth daily with breakfast.    . vitamin C (ASCORBIC ACID) 500 MG tablet Take 500 mg by mouth daily.    . Vitamin D, Cholecalciferol, 1000 units CAPS Take 1,000 Units by mouth daily.     Marland Kitchen atorvastatin (LIPITOR) 80 MG tablet Take 80 mg by mouth every evening.     . Continuous Blood Gluc Receiver (DEXCOM G6 RECEIVER) DEVI Use to check blood sugars. 1 each 0  . Continuous Blood Gluc Sensor (DEXCOM G6 SENSOR) MISC Use to check blood sugars 3 each 2  . Continuous Blood Gluc Transmit (DEXCOM G6 TRANSMITTER) MISC Use  to check blood sugars 1 each 0  . glucose blood (ONE TOUCH ULTRA TEST) test strip Use as instructed to test 3 times daily 300 each 3  . Insulin Pen Needle 31G X 5 MM MISC Use for insulin and Victoza 200 each 1    Results for orders placed or performed during the hospital encounter of 11/04/19 (from the past 48 hour(s))  Basic metabolic panel per protocol     Status: Abnormal   Collection Time: 11/02/19 10:11 AM  Result Value Ref Range   Sodium 139 135 - 145 mmol/L   Potassium 4.4 3.5 - 5.1 mmol/L   Chloride 103 98 - 111 mmol/L   CO2 26 22 - 32 mmol/L   Glucose, Bld 153 (H) 70 - 99 mg/dL    Comment: Glucose reference range applies only to samples taken after fasting for at least 8 hours.   BUN 13 6 - 20 mg/dL   Creatinine, Ser 0.60 0.44 - 1.00 mg/dL   Calcium 9.2 8.9 - 10.3 mg/dL   GFR calc non Af Amer >60 >60 mL/min   GFR calc Af Amer >60 >60 mL/min   Anion gap 10 5 - 15    Comment: Performed at [facility] [address] [place], [place] [address]  Glucose, capillary     Status: None   Collection Time: 11/04/19  7:44 AM  Result Value Ref Range   Glucose-Capillary 70 70 - 99 mg/dL    Comment: Glucose reference range applies only to samples taken after fasting for at least 8 hours.   No results found.  Review of Systems  Constitutional: Negative.   HENT: Positive for congestion.   Respiratory: Negative.   Cardiovascular: Negative.     Blood pressure 116/79, pulse 72, temperature 98.3 F (36.8 C), temperature source Oral, resp. rate 16, height 4\' 11"  (1.499 m), weight 82 kg, SpO2 99 %. Physical Exam Constitutional:      Appearance: She is normal weight.  HENT:     Nose:     Comments: Septal Deviation and IT Hypertrophy Cardiovascular:     Rate and Rhythm: Normal rate.  Pulmonary:     Effort: Pulmonary effort is normal.  Musculoskeletal:     Cervical back: Normal range of motion.  Neurological:     Mental Status: She is alert.      Assessment/Plan Adm  for OP Septoplasty and IT reduction  Jerrell Belfast, MD 11/04/2019, 8:22 AM

## 2019-11-04 NOTE — Anesthesia Postprocedure Evaluation (Signed)
Anesthesia Post Note  Patient: Amy Clarke  Procedure(s) Performed: NASAL SEPTOPLASTY WITH TURBINATE REDUCTION (Bilateral Nose)     Patient location during evaluation: PACU Anesthesia Type: General Level of consciousness: awake and alert Pain management: pain level controlled Vital Signs Assessment: post-procedure vital signs reviewed and stable Respiratory status: spontaneous breathing, nonlabored ventilation, respiratory function stable and patient connected to nasal cannula oxygen Cardiovascular status: blood pressure returned to baseline and stable Postop Assessment: no apparent nausea or vomiting Anesthetic complications: no   No complications documented.  Last Vitals:  Vitals:   11/04/19 1018 11/04/19 1046  BP:  105/73  Pulse: 86 84  Resp: 16 16  Temp:  36.8 C  SpO2: 94% 96%    Last Pain:  Vitals:   11/04/19 1046  TempSrc:   PainSc: 0-No pain                 Tiajuana Amass

## 2019-11-04 NOTE — Anesthesia Procedure Notes (Signed)
Procedure Name: Intubation Date/Time: 11/04/2019 8:44 AM Performed by: Genelle Bal, CRNA Pre-anesthesia Checklist: Patient identified, Emergency Drugs available, Suction available and Patient being monitored Patient Re-evaluated:Patient Re-evaluated prior to induction Oxygen Delivery Method: Circle system utilized Preoxygenation: Pre-oxygenation with 100% oxygen Induction Type: IV induction Ventilation: Mask ventilation without difficulty Laryngoscope Size: Miller and 2 Grade View: Grade I Tube type: Oral Tube size: 7.0 mm Number of attempts: 1 Airway Equipment and Method: Stylet and Oral airway Placement Confirmation: ETT inserted through vocal cords under direct vision,  positive ETCO2 and breath sounds checked- equal and bilateral Secured at: 20 cm Tube secured with: Tape Dental Injury: Teeth and Oropharynx as per pre-operative assessment

## 2019-11-04 NOTE — Anesthesia Preprocedure Evaluation (Signed)
Anesthesia Evaluation  Patient identified by MRN, date of birth, ID band Patient awake    Reviewed: Allergy & Precautions, NPO status , Patient's Chart, lab work & pertinent test results  History of Anesthesia Complications (+) PONV  Airway Mallampati: II  TM Distance: >3 FB Neck ROM: Full    Dental  (+) Dental Advisory Given   Pulmonary asthma ,    breath sounds clear to auscultation       Cardiovascular hypertension, Pt. on medications and Pt. on home beta blockers + dysrhythmias  Rhythm:Regular Rate:Normal     Neuro/Psych  Headaches,    GI/Hepatic Neg liver ROS, GERD  ,  Endo/Other  diabetes  Renal/GU Renal disease     Musculoskeletal   Abdominal   Peds  Hematology negative hematology ROS (+)   Anesthesia Other Findings   Reproductive/Obstetrics                             Anesthesia Physical Anesthesia Plan  ASA: II  Anesthesia Plan: General   Post-op Pain Management:    Induction: Intravenous  PONV Risk Score and Plan: 4 or greater and Scopolamine patch - Pre-op, Dexamethasone, Ondansetron and Treatment may vary due to age or medical condition  Airway Management Planned: Oral ETT  Additional Equipment: None  Intra-op Plan:   Post-operative Plan: Extubation in OR  Informed Consent: I have reviewed the patients History and Physical, chart, labs and discussed the procedure including the risks, benefits and alternatives for the proposed anesthesia with the patient or authorized representative who has indicated his/her understanding and acceptance.     Dental advisory given  Plan Discussed with: CRNA  Anesthesia Plan Comments:         Anesthesia Quick Evaluation

## 2019-11-04 NOTE — Discharge Instructions (Signed)
Next dose of Tylenol can be given at 2:00pm if needed.    Post Anesthesia Home Care Instructions  Activity: Get plenty of rest for the remainder of the day. A responsible individual must stay with you for 24 hours following the procedure.  For the next 24 hours, DO NOT: -Drive a car -Paediatric nurse -Drink alcoholic beverages -Take any medication unless instructed by your physician -Make any legal decisions or sign important papers.  Meals: Start with liquid foods such as gelatin or soup. Progress to regular foods as tolerated. Avoid greasy, spicy, heavy foods. If nausea and/or vomiting occur, drink only clear liquids until the nausea and/or vomiting subsides. Call your physician if vomiting continues.  Special Instructions/Symptoms: Your throat may feel dry or sore from the anesthesia or the breathing tube placed in your throat during surgery. If this causes discomfort, gargle with warm salt water. The discomfort should disappear within 24 hours.  If you had a scopolamine patch placed behind your ear for the management of post- operative nausea and/or vomiting:  1. The medication in the patch is effective for 72 hours, after which it should be removed.  Wrap patch in a tissue and discard in the trash. Wash hands thoroughly with soap and water. 2. You may remove the patch earlier than 72 hours if you experience unpleasant side effects which may include dry mouth, dizziness or visual disturbances. 3. Avoid touching the patch. Wash your hands with soap and water after contact with the patch.    Call your surgeon if you experience:   1.  Fever over 101.0. 2.  Inability to urinate. 3.  Nausea and/or vomiting. 4.  Extreme swelling or bruising at the surgical site. 5.  Continued bleeding from the incision. 6.  Increased pain, redness or drainage from the incision. 7.  Problems related to your pain medication. 8.  Any problems and/or concerns

## 2019-11-05 ENCOUNTER — Encounter (HOSPITAL_BASED_OUTPATIENT_CLINIC_OR_DEPARTMENT_OTHER): Payer: Self-pay | Admitting: Otolaryngology

## 2019-11-17 ENCOUNTER — Other Ambulatory Visit: Payer: Self-pay | Admitting: Endocrinology

## 2019-12-03 ENCOUNTER — Other Ambulatory Visit: Payer: Self-pay | Admitting: Endocrinology

## 2019-12-05 DIAGNOSIS — Z23 Encounter for immunization: Secondary | ICD-10-CM | POA: Diagnosis not present

## 2019-12-08 NOTE — Progress Notes (Signed)
+Patient ID: Amy Clarke, female   DOB: 11-Oct-1970, 49 y.o.   MRN: [id number]           Reason for Appointment:  Follow-up for Type 2 Diabetes  Referring physician: Mayra Neer   History of Present Illness:          Date of diagnosis of type 2 diabetes mellitus: 2003?         Background history:   She had gestational diabetes in 1992 and subsequently was on diet alone She thinks she was started on diabetes medication about 15 years ago and probably took metformin which she took until about a year ago and this was stopped because of diarrhea.  Records show that she was taking 1000 mg of regular metformin twice a day With her PCP she has been on numerous diabetes medications over the years including Byetta, Trulicity, Jardiance and Januvia which apparently all caused side effects, mostly nausea or yeast infections with Vania Rea and Farxiga Her A1c has been mostly higher this year, last year has been as low as 7.2, in January her A1c was 8.4  Recent history:   INSULIN regimen is:  Toujeo 120 units in the morning.  Humulin R U-500, up to 110- 120 twice daily   Non-insulin hypoglycemic drugs the patient is taking are: None   Current management, blood sugar patterns and problems identified:  Her A1c is still around 9%   She has started using the Dexcom about a month ago after recommendations on several visits  The following patterns are seen on her Dexcom, interpretation as below   Generally blood sugars are on an average within the target range except around 4-5 p.m. when they are higher  Dexcom appears to be accurate compared with fingerstick today blood    Blood sugars are not analyzed for about 4 days in the last 10 days that are reviewed  LOWEST blood sugars are around 6-7 AM and after about the 9 days analyzed blood sugars have been below 70 at least 3 or 4 times Early morning  Hyperglycemia is mostly occurring in the afternoons is also some days after dinner along  with modest rise after 9 AM  Blood sugar variability is modest with standard deviation 50  Hypoglycemia as above is mostly early morning with only rare low blood sugar at 2 PM  Since her mealtimes are variable not clear which readings are after meals but overall blood sugars are relatively flat after dinner if starting of slightly high    She is not correlating her blood sugars with a fingerstick as yet  Frequently not eating breakfast and usually drink coffee  At lunchtime she will have variable intake, sometimes only a snack and occasionally leftovers  She adjusts her mealtime dose based on portions on her carbohydrate intake and may take up to 110 units at lunch if eating a meal  However she did not start taking her Humulin R in the morning as directed despite her blood sugar rising without any food       Side effects from medications have been: Diarhea with regular metformin, frequent candidiasis from Jardiance, nausea from [place] are 10 AM, 2 PM and 6 PM, may skip breakfast  Glucose monitoring:  done  1-2 times a day         Glucometer: Dario  CGM use % of time  52  2-week average/SD  143+/-50  Time in range       69%  % Time  Above 180  24  % Time above 250  1.6  % Time Below 70  7    Prior  PRE-MEAL Fasting Lunch  afternoon Bedtime Overall  Glucose range:  81-127  231, 267  141, 175    Mean/median:     ?   POST-MEAL PC Breakfast PC Lunch  8-9 PM  Glucose range:    159, 230  Mean/median:        Self-care: The diet that the patient has been following is: tries to limit high-fat foods and drinks with sugar .     Typical meal intake: Breakfast is sometimes cereal/bagel/granola Bar.  Usually eating breakfast only if she is working  [employer] usually yogurt, cheese, fruit and crackers.  Dinner chicken with vegetables.  She will have snacks with granola bar or popcorn                Dietician visit, most recent: Several years ago in class                 Weight history:  Wt Readings from Last 3 Encounters:  12/09/19 178 lb 6.4 oz (80.9 kg)  11/04/19 180 lb 12.4 oz (82 kg)  10/09/19 181 lb 6.4 oz (82.3 kg)    Glycemic control:   Lab Results  Component Value Date   HGBA1C 9.0 (H) 10/07/2019   HGBA1C 9.2 (H) 03/11/2019   HGBA1C 8.1 (H) 08/29/2018   Lab Results  Component Value Date   MICROALBUR 4.9 (H) 03/11/2019   LDLCALC 71 03/25/2019   CREATININE 0.60 11/02/2019   Lab Results  Component Value Date   MICRALBCREAT 3.4 03/11/2019    Lab Results  Component Value Date   FRUCTOSAMINE 278 10/31/2018   FRUCTOSAMINE 265 07/18/2018   FRUCTOSAMINE 372 (H) 09/10/2017      Allergies as of 12/09/2019      Reactions   Farxiga [dapagliflozin] Other (See Comments)   YEAST INFECTION   Januvia [sitagliptin] Diarrhea, Nausea And Vomiting   Lisinopril Cough   Tanzeum [albiglutide] Nausea And Vomiting   Trulicity [dulaglutide] Nausea Only   Adhesive [tape] Rash   Sulfonamide Derivatives Rash      Medication List       Accurate as of December 09, 2019  9:04 AM. If you have any questions, ask your nurse or doctor.        Albuterol Sulfate 108 (90 Base) MCG/ACT Aepb Commonly known as: PROAIR RESPICLICK Inhale 2 puffs into the lungs as needed (shortness of breath).   atorvastatin 80 MG tablet Commonly known as: LIPITOR Take 80 mg by mouth every evening.   budesonide-formoterol 160-4.5 MCG/ACT inhaler Commonly known as: SYMBICORT Inhale 2 puffs into the lungs 2 (two) times daily as needed (asthma).   cyanocobalamin 1000 MCG/ML injection Commonly known as: (VITAMIN B-12) SMARTSIG:Milliliter(s) Injection Once a Month   Dexcom G6 Receiver Devi Use to check blood sugars.   Dexcom G6 Sensor Misc Use to check blood sugars   Dexcom G6 Transmitter Misc Use to check blood sugars   ezetimibe 10 MG tablet Commonly known as: Zetia Take 1 tablet (10 mg total) by mouth daily.   fenofibrate 145 MG tablet Commonly known  as: TRICOR Take 1 tablet (145 mg total) by mouth daily.   glucose blood test strip Commonly known as: ONE TOUCH ULTRA TEST Use as instructed to test 3 times daily   HumuLIN R U-500 KwikPen 500 UNIT/ML kwikpen Generic drug: insulin regular human CONCENTRATED INJECT 80 UNITS BEFORE MEALS AND  BEDTIME, ADJUST AS DIRECTED What changed: how much to take   Insulin Pen Needle 31G X 5 MM Misc Use for insulin and Victoza   Magnesium 250 MG Tabs Take 250 mg by mouth daily.   metoprolol tartrate 50 MG tablet Commonly known as: LOPRESSOR Take 1 tablet (50 mg total) by mouth 2 (two) times daily.   NovoLOG FlexPen 100 UNIT/ML FlexPen Generic drug: insulin aspart INJECT 120 UNITS UNDER THE SKIN TWICE DAILY   omeprazole 40 MG capsule Commonly known as: PRILOSEC Take 80 mg by mouth daily.   Toujeo Max SoloStar 300 UNIT/ML Solostar Pen Generic drug: insulin glargine (2 Unit Dial) Inject 110 Units into the skin daily.   Toujeo SoloStar 300 UNIT/ML Solostar Pen Generic drug: insulin glargine (1 Unit Dial) INJECT 120 UNITS INTO THE SKIN DAILY   valsartan 320 MG tablet Commonly known as: DIOVAN Take 320 mg by mouth daily.   venlafaxine XR 150 MG 24 hr capsule Commonly known as: EFFEXOR-XR Take 150 mg by mouth daily with breakfast.   vitamin C 500 MG tablet Commonly known as: ASCORBIC ACID Take 500 mg by mouth daily.   Vitamin D (Cholecalciferol) 25 MCG (1000 UT) Caps Take 1,000 Units by mouth daily.       Allergies:  Allergies  Allergen Reactions  . Wilder Glade [Dapagliflozin] Other (See Comments)    YEAST INFECTION   . Januvia [Sitagliptin] Diarrhea and Nausea And Vomiting  . Lisinopril Cough  . Tanzeum [Albiglutide] Nausea And Vomiting  . Trulicity [Dulaglutide] Nausea Only  . Adhesive [Tape] Rash  . Sulfonamide Derivatives Rash    Past Medical History:  Diagnosis Date  . Asthma   . Complication of anesthesia    hard time waking up  . Depression   . Diabetes ([place])    . Dysrhythmia    father died at age 81 due to MI, heart cath in 10/2018, no intervention  . GERD (gastroesophageal reflux disease)   . History of kidney stones   . Hyperlipidemia   . Hypertension   . Kidney cysts   . Kidney stones   . Migraine headache   . PONV (postoperative nausea and vomiting)     Past Surgical History:  Procedure Laterality Date  . ABDOMINAL HYSTERECTOMY    . CARDIAC CATHETERIZATION    . CESAREAN SECTION     x2  . CHOLECYSTECTOMY    . LEFT HEART CATH AND CORONARY ANGIOGRAPHY N/A 10/29/2017   Procedure: LEFT HEART CATH AND CORONARY ANGIOGRAPHY;  Surgeon: Martinique, Peter M, MD;  Location: [facility];  Service: Cardiovascular;  Laterality: N/A;  . LITHOTRIPSY     x3  . NASAL SEPTOPLASTY W/ TURBINOPLASTY Bilateral 11/04/2019   Procedure: NASAL SEPTOPLASTY WITH TURBINATE REDUCTION;  Surgeon: Jerrell Belfast, MD;  Location: [facility];  Service: ENT;  Laterality: Bilateral;  . none    . TONSILLECTOMY      Family History  Problem Relation Age of Onset  . Diabetes Mother   . Diabetes Maternal Grandmother   . Thyroid disease Cousin     Social History:  reports that she has never smoked. She has never used smokeless tobacco. She reports current alcohol use. She reports that she does not use drugs.   Review of Systems   HYPERLIPIDEMIA:  She has mixed hyperlipidemia She was supposed to be taking Crestor but atorvastatin is on her list and she has not for some reason been getting this prescription from her PCP Also taking Zetia  Labs  as below including results from PCP, triglycerides persistently high    Lab Results  Component Value Date   CHOL 220 (H) 10/07/2019   CHOL 163 03/25/2019   CHOL 133 07/18/2018   Lab Results  Component Value Date   HDL 30.60 (L) 10/07/2019   HDL 33.90 (L) 07/18/2018   HDL 33.20 (L) 05/08/2018   Lab Results  Component Value Date   LDLCALC 71 03/25/2019   Lab Results  Component Value Date    TRIG (H) 10/07/2019    477.0 Triglyceride is over 400; calculations on Lipids are invalid.   TRIG 286.0 (H) 07/18/2018   TRIG (H) 05/08/2018    451.0 Triglyceride is over 400; calculations on Lipids are invalid.   Lab Results  Component Value Date   CHOLHDL 7 10/07/2019   CHOLHDL 4 07/18/2018   CHOLHDL 6 05/08/2018   Lab Results  Component Value Date   LDLDIRECT 138.0 10/07/2019   LDLDIRECT 79.0 07/18/2018   LDLDIRECT 125.0 05/08/2018            Hypertension: Blood pressure has been high since her 9s, managed by PCP with valsartan 320 mg   BP Readings from Last 3 Encounters:  12/09/19 124/82  11/04/19 105/73  10/09/19 122/80   Microalbuminuria: Last microalbumin ratio 73.6, done on 2/20  Most recent eye exam was In 04/2016  Most recent foot exam: 03/2018    Physical Examination:  BP 124/82   Pulse 83   Ht 5\' 4"  (1.626 m)   Wt 178 lb 6.4 oz (80.9 kg)   SpO2 96%   BMI 30.62 kg/m       ASSESSMENT:  Diabetes type 2, uncontrolled with BMI 35  See history of present illness for detailed discussion of current diabetes management, blood sugar patterns and problems identified  Her A1c is last 9.2  She has had insulin resistance and requiring large doses of insulin Has not been able to tolerate non-insulin hypoglycemic drugs for various reasons  With using the Dexcom she is able to better understand her blood sugar patterns and appears to be getting somewhat more optimal control Recently are average blood sugar on the Dexcom is 143 but is also getting hypoglycemia 7% of the time  She did not adjust her insulin doses as directed and not taking her U-500 in the morning also despite her blood sugars rising from morning till evening Discussed blood sugar targets and need to get at least 70% of the blood sugars within target range  HYPERLIPIDEMIA: Lipids are poorly controlled and triglycerides are significantly higher She is not taking her statin drug and not clear  why Also had not benefited from atorvastatin in the past  HYPERTENSION: Blood pressure is controlled    PLAN:    She needs to take her Humulin R 3 times a day  She will need to take at least 20 units in the morning regardless of whether she is eating breakfast or not  Also even without any carbohydrate intake at lunchtime she will need minimum 20 to 30 units to keep her blood sugars going up in the afternoon  Otherwise for her meals she can try to adjust her doses as previously done  Reduce Toujeo down to 100 units and adjust 5 to 10 units after a few days based on her early morning blood sugars  She will check postprandial readings consistently  Restart regular walking for exercise  Low-fat diet   For her elevated LDL restart Crestor 40 mg daily and  have fasting labs done on the next visit for lipids  Continue fenofibrate 145 mg daily for high triglycerides   Follow-up in 2 months     Patient Instructions  Toujeo 100 units in am  On waking up 20-30 of Humulin R and again at noon           Elayne Snare 12/09/2019, 9:04 AM   Note: This office note was prepared with Dragon voice recognition system technology. Any transcriptional errors that result from this process are unintentional.

## 2019-12-09 ENCOUNTER — Other Ambulatory Visit: Payer: Self-pay

## 2019-12-09 ENCOUNTER — Encounter: Payer: Self-pay | Admitting: Endocrinology

## 2019-12-09 ENCOUNTER — Ambulatory Visit: Payer: BC Managed Care – PPO | Admitting: Endocrinology

## 2019-12-09 VITALS — BP 124/82 | HR 83 | Ht 64.0 in | Wt 178.4 lb

## 2019-12-09 DIAGNOSIS — E782 Mixed hyperlipidemia: Secondary | ICD-10-CM | POA: Diagnosis not present

## 2019-12-09 DIAGNOSIS — Z794 Long term (current) use of insulin: Secondary | ICD-10-CM

## 2019-12-09 DIAGNOSIS — E1165 Type 2 diabetes mellitus with hyperglycemia: Secondary | ICD-10-CM | POA: Diagnosis not present

## 2019-12-09 LAB — GLUCOSE, POCT (MANUAL RESULT ENTRY): POC Glucose: 128 mg/dl — AB (ref 70–99)

## 2019-12-09 MED ORDER — ROSUVASTATIN CALCIUM 40 MG PO TABS: 40.0000 mg | ORAL_TABLET | Freq: Every day | ORAL | 1 refills | Status: DC

## 2019-12-09 NOTE — Patient Instructions (Signed)
Toujeo 100 units in am  On waking up 20-30 of Humulin R and again at noon

## 2019-12-17 ENCOUNTER — Other Ambulatory Visit: Payer: Self-pay | Admitting: Endocrinology

## 2019-12-28 ENCOUNTER — Other Ambulatory Visit: Payer: Self-pay | Admitting: Endocrinology

## 2020-01-11 ENCOUNTER — Other Ambulatory Visit: Payer: Self-pay | Admitting: Endocrinology

## 2020-01-11 DIAGNOSIS — Z794 Long term (current) use of insulin: Secondary | ICD-10-CM

## 2020-01-11 DIAGNOSIS — E1165 Type 2 diabetes mellitus with hyperglycemia: Secondary | ICD-10-CM

## 2020-01-20 ENCOUNTER — Other Ambulatory Visit: Payer: Self-pay | Admitting: Advanced Practice Midwife

## 2020-02-08 ENCOUNTER — Other Ambulatory Visit: Payer: Self-pay | Admitting: Endocrinology

## 2020-02-08 ENCOUNTER — Other Ambulatory Visit: Payer: Self-pay | Admitting: Cardiovascular Disease

## 2020-02-09 ENCOUNTER — Other Ambulatory Visit: Payer: Self-pay | Admitting: Endocrinology

## 2020-02-09 DIAGNOSIS — E1165 Type 2 diabetes mellitus with hyperglycemia: Secondary | ICD-10-CM

## 2020-02-09 DIAGNOSIS — Z794 Long term (current) use of insulin: Secondary | ICD-10-CM

## 2020-02-15 ENCOUNTER — Ambulatory Visit: Payer: BC Managed Care – PPO | Admitting: Endocrinology

## 2020-03-01 ENCOUNTER — Telehealth: Payer: Self-pay | Admitting: Endocrinology

## 2020-03-01 NOTE — Telephone Encounter (Signed)
Amy Clarke with [facility] requests to be called at ph# [phone] re: PA for Victoza

## 2020-03-02 ENCOUNTER — Other Ambulatory Visit: Payer: Self-pay

## 2020-03-02 ENCOUNTER — Ambulatory Visit: Payer: BC Managed Care – PPO | Admitting: Endocrinology

## 2020-03-02 ENCOUNTER — Encounter: Payer: Self-pay | Admitting: Endocrinology

## 2020-03-02 VITALS — BP 126/84 | HR 81 | Ht 64.0 in | Wt 183.8 lb

## 2020-03-02 DIAGNOSIS — E782 Mixed hyperlipidemia: Secondary | ICD-10-CM

## 2020-03-02 DIAGNOSIS — E1165 Type 2 diabetes mellitus with hyperglycemia: Secondary | ICD-10-CM

## 2020-03-02 DIAGNOSIS — Z794 Long term (current) use of insulin: Secondary | ICD-10-CM | POA: Diagnosis not present

## 2020-03-02 LAB — POCT GLYCOSYLATED HEMOGLOBIN (HGB A1C): Hemoglobin A1C: 7.4 % — AB (ref 4.0–5.6)

## 2020-03-02 NOTE — Progress Notes (Signed)
+Patient ID: Amy Clarke, female   DOB: October 18, 1970, 50 y.o.   MRN: [id number]           Reason for Appointment:  Follow-up for Type 2 Diabetes  Referring physician: Mayra Neer   History of Present Illness:          Date of diagnosis of type 2 diabetes mellitus: 2003?         Background history:   She had gestational diabetes in 1992 and subsequently was on diet alone She thinks she was started on diabetes medication about 15 years ago and probably took metformin which she took until about a year ago and this was stopped because of diarrhea.  Records show that she was taking 1000 mg of regular metformin twice a day With her PCP she has been on numerous diabetes medications over the years including Byetta, Trulicity, Jardiance and Januvia which apparently all caused side effects, mostly nausea or yeast infections with Vania Rea and Farxiga Her A1c has been mostly higher this year, last year has been as low as 7.2, in January her A1c was 8.4  Recent history:   INSULIN regimen is:  Toujeo 80 units in the morning.  Humulin R U-500, up to 110 units 2 or 3 times daily before meals   Non-insulin hypoglycemic drugs the patient is taking are: None   Current management, blood sugar patterns and problems identified:  Her A1c is usually around 9% but now only 7.4   The following patterns are seen on her Dexcom, interpretation as below   HIGHEST blood sugars are on an average late at night starting to go up at 8 PM and around midnight appear to be peaking.  Average from 11 PM-12 AM = 205  However LOWEST blood sugars are early morning including some hypoglycemia; average at 5-7 AM = 125  Blood sugars appear to be not following any consistent pattern, some days starting to go up higher in the morning but other times in the afternoon  POSTPRANDIAL readings appear to be mostly running higher after evening meal  HYPOGLYCEMIA is less in the last week with only 1 occurrence early  morning but otherwise in week #1 it was more often   She was told to reduce her Toujeo on the last visit but she is somewhat inconsistent about how she is taking her doses  Currently taking about 80 units, previously had been on as much as 120  However even though she gets high readings late at night she does not go more than about 110 on her Humulin R insulin  She will take up to 80 units in the morning when drinking coffee but about 20 to 30 units more when eating a meal  Also when blood sugars are unusually high she will try to take NovoLog 60 to 80 units but not clear if this is effective and she does not think this works quickly  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 as she will not wake up when her blood sugars are lower early morning and these are being treated by her husband who is also linked to her Dexcom  Again mealtimes and meal composition is quite variable  She is not doing a formal exercise program  Weight is slightly higher       Side effects from medications have been: Diarhea with regular metformin, frequent candidiasis from Jardiance, nausea from [place] are 10 AM, 2 PM and 6 PM, may skip breakfast  CGM use %  of time  92  2-week average/SD  168/54  Time in range     55   %  % Time Above 180 41  % Time above 250 6  % Time Below 70 3.2     PREVIOUS data:   CGM use % of time  52  2-week average/SD  143+/-50  Time in range       69%  % Time Above 180  24  % Time above 250  1.6  % Time Below 70  7      Self-care: The diet that the patient has been following is: tries to limit high-fat foods and drinks with sugar .     Typical meal intake: Breakfast is sometimes cereal/bagel/granola Bar.  Usually eating breakfast only if she is working  [employer] usually yogurt, cheese, fruit and crackers.  Dinner chicken with vegetables.  She will have snacks with granola bar or popcorn                Dietician visit, most recent: Several years  ago in class                Weight history:  Wt Readings from Last 3 Encounters:  03/02/20 183 lb 12.8 oz (83.4 kg)  12/09/19 178 lb 6.4 oz (80.9 kg)  11/04/19 180 lb 12.4 oz (82 kg)    Glycemic control:   Lab Results  Component Value Date   HGBA1C 7.4 (A) 03/02/2020   HGBA1C 9.0 (H) 10/07/2019   HGBA1C 9.2 (H) 03/11/2019   Lab Results  Component Value Date   MICROALBUR 4.9 (H) 03/11/2019   LDLCALC 71 03/25/2019   CREATININE 0.60 11/02/2019   Lab Results  Component Value Date   MICRALBCREAT 3.4 03/11/2019    Lab Results  Component Value Date   FRUCTOSAMINE 278 10/31/2018   FRUCTOSAMINE 265 07/18/2018   FRUCTOSAMINE 372 (H) 09/10/2017      Allergies as of 03/02/2020      Reactions   Farxiga [dapagliflozin] Other (See Comments)   YEAST INFECTION   Januvia [sitagliptin] Diarrhea, Nausea And Vomiting   Lisinopril Cough   Tanzeum [albiglutide] Nausea And Vomiting   Trulicity [dulaglutide] Nausea Only   Adhesive [tape] Rash   Sulfonamide Derivatives Rash      Medication List       Accurate as of March 02, 2020 10:47 AM. If you have any questions, ask your nurse or doctor.        Albuterol Sulfate 108 (90 Base) MCG/ACT Aepb Commonly known as: PROAIR RESPICLICK Inhale 2 puffs into the lungs as needed (shortness of breath).   budesonide-formoterol 160-4.5 MCG/ACT inhaler Commonly known as: SYMBICORT Inhale 2 puffs into the lungs 2 (two) times daily as needed (asthma).   cyanocobalamin 1000 MCG/ML injection Commonly known as: (VITAMIN B-12) SMARTSIG:Milliliter(s) Injection Once a Month   Dexcom G6 Receiver Devi Use to check blood sugars.   Dexcom G6 Sensor Misc USE TO CHECK BLOOD SUGAR   Dexcom G6 Transmitter Misc USE TO CHECK BLOOD SUGAR   ezetimibe 10 MG tablet Commonly known as: Zetia Take 1 tablet (10 mg total) by mouth daily.   fenofibrate 145 MG tablet Commonly known as: TRICOR Take 1 tablet (145 mg total) by mouth daily.   glucose  blood test strip Commonly known as: ONE TOUCH ULTRA TEST Use as instructed to test 3 times daily   HumuLIN R U-500 KwikPen 500 UNIT/ML kwikpen Generic drug: insulin regular human CONCENTRATED Inject 120 Units into the  skin 4 (four) times daily. INJECT 80 UNITS BEFORE MEALS AND BEDTIME, ADJUST AS DIRECTED What changed:   how much to take  additional instructions   Insulin Pen Needle 31G X 5 MM Misc Use for insulin and Victoza   Magnesium 250 MG Tabs Take 250 mg by mouth daily.   metoprolol tartrate 50 MG tablet Commonly known as: LOPRESSOR TAKE (1) TABLET BY MOUTH TWICE DAILY.   NovoLOG FlexPen 100 UNIT/ML FlexPen Generic drug: insulin aspart INJECT 120 UNITS UNDER THE SKIN TWICE DAILY What changed: See the new instructions.   omeprazole 40 MG capsule Commonly known as: PRILOSEC Take 80 mg by mouth daily.   rosuvastatin 40 MG tablet Commonly known as: Crestor Take 1 tablet (40 mg total) by mouth daily.   Toujeo Max SoloStar 300 UNIT/ML Solostar Pen Generic drug: insulin glargine (2 Unit Dial) Inject 110 Units into the skin daily. What changed:   how much to take  Another medication with the same name was removed. Continue taking this medication, and follow the directions you see here.   valsartan 320 MG tablet Commonly known as: DIOVAN Take 320 mg by mouth daily.   venlafaxine XR 150 MG 24 hr capsule Commonly known as: EFFEXOR-XR Take 150 mg by mouth daily with breakfast.   vitamin C 500 MG tablet Commonly known as: ASCORBIC ACID Take 500 mg by mouth daily.   Vitamin D (Cholecalciferol) 25 MCG (1000 UT) Caps Take 1,000 Units by mouth daily.       Allergies:  Allergies  Allergen Reactions   Farxiga [Dapagliflozin] Other (See Comments)    YEAST INFECTION    Januvia [Sitagliptin] Diarrhea and Nausea And Vomiting   Lisinopril Cough   Tanzeum [Albiglutide] Nausea And Vomiting   Trulicity [Dulaglutide] Nausea Only   Adhesive [Tape] Rash    Sulfonamide Derivatives Rash    Past Medical History:  Diagnosis Date   Asthma    Complication of anesthesia    hard time waking up   Depression    Diabetes [facility])    Dysrhythmia    father died at age 54 due to MI, heart cath in 10/2018, no intervention   GERD (gastroesophageal reflux disease)    History of kidney stones    Hyperlipidemia    Hypertension    Kidney cysts    Kidney stones    Migraine headache    PONV (postoperative nausea and vomiting)     Past Surgical History:  Procedure Laterality Date   ABDOMINAL HYSTERECTOMY     CARDIAC CATHETERIZATION     CESAREAN SECTION     x2   CHOLECYSTECTOMY     LEFT HEART CATH AND CORONARY ANGIOGRAPHY N/A 10/29/2017   Procedure: LEFT HEART CATH AND CORONARY ANGIOGRAPHY;  Surgeon: Martinique, Peter M, MD;  Location: [place] CV LAB;  Service: Cardiovascular;  Laterality: N/A;   LITHOTRIPSY     x3   NASAL SEPTOPLASTY W/ TURBINOPLASTY Bilateral 11/04/2019   Procedure: NASAL SEPTOPLASTY WITH TURBINATE REDUCTION;  Surgeon: Jerrell Belfast, MD;  Location: [place];  Service: ENT;  Laterality: Bilateral;   none     TONSILLECTOMY      Family History  Problem Relation Age of Onset   Diabetes Mother    Diabetes Maternal Grandmother    Thyroid disease Cousin     Social History:  reports that she has never smoked. She has never used smokeless tobacco. She reports current alcohol use. She reports that she does not use drugs.   Review  of Systems   HYPERLIPIDEMIA:  She has mixed hyperlipidemia She was supposed to be taking Crestor but atorvastatin is on her list and she has not for some reason been getting this prescription from her PCP Also taking Zetia  Labs as below including results from PCP, triglycerides persistently high    Lab Results  Component Value Date   CHOL 220 (H) 10/07/2019   CHOL 163 03/25/2019   CHOL 133 07/18/2018   Lab Results  Component Value Date   HDL 30.60  (L) 10/07/2019   HDL 33.90 (L) 07/18/2018   HDL 33.20 (L) 05/08/2018   Lab Results  Component Value Date   LDLCALC 71 03/25/2019   Lab Results  Component Value Date   TRIG (H) 10/07/2019    477.0 Triglyceride is over 400; calculations on Lipids are invalid.   TRIG 286.0 (H) 07/18/2018   TRIG (H) 05/08/2018    451.0 Triglyceride is over 400; calculations on Lipids are invalid.   Lab Results  Component Value Date   CHOLHDL 7 10/07/2019   CHOLHDL 4 07/18/2018   CHOLHDL 6 05/08/2018   Lab Results  Component Value Date   LDLDIRECT 138.0 10/07/2019   LDLDIRECT 79.0 07/18/2018   LDLDIRECT 125.0 05/08/2018            Hypertension: Blood pressure has been high since her 49s, managed by PCP with valsartan 320 mg   BP Readings from Last 3 Encounters:  03/02/20 126/84  12/09/19 124/82  11/04/19 105/73   Microalbuminuria: Last microalbumin ratio 73.6, done on 2/20  Most recent eye exam was In 04/2016  Most recent foot exam: 03/2018    Physical Examination:  BP 126/84    Pulse 81    Ht 5\' 4"  (1.626 m)    Wt 183 lb 12.8 oz (83.4 kg)    SpO2 98%    BMI 31.55 kg/m       ASSESSMENT:  Diabetes type 2, uncontrolled with BMI 35  See history of present illness for detailed discussion of current diabetes management, blood sugar patterns and problems identified  Her A1c is last 9.2 and now 7.4  She has had insulin resistance and requiring large doses of insulin Blood sugars overall better likely from more regular use of the Humulin R and requiring less basal insulin as above Most of her hyperglycemia is postprandial but also somewhat inconsistent Has tendency to occasional hypoglycemia early morning which is variable also  HYPERLIPIDEMIA: Lipids last done by PCP showed LDL of 138 and needs to come in fasting for assessment of her progress with Crestor  HYPERTENSION: Blood pressure is controlled    PLAN:    She needs to take higher doses of Humulin R for each  meal  Likely needs at least 20 to 30 units more for coverage of her evening meal depending on meal size  Discussed that she has low sugars early morning likely from Toujeo  This will be reduced to 65 units for now  She will use a flowsheet that was provided to adjust the Toujeo by 5 units every 3 days based on fasting readings when she wakes up  3 times a day  She will need to take her Humulin R 20 to 30 minutes before planned meals  Consider OmniPod 5 pump when available if she can afford it  Generally can try to cut back on carbohydrates also  Needs regular walking for exercise  Low-fat diet   Needs fasting labs done   Follow-up in 2  months     Patient Instructions  Take 74 Toujeo and adjust based on am sugar  Meals take 100-140 Humulin R            Kree Rafter 03/02/2020, 10:47 AM   Note: This office note was prepared with Dragon voice recognition system technology. Any transcriptional errors that result from this process are unintentional.

## 2020-03-02 NOTE — Patient Instructions (Addendum)
Take 65 Toujeo and adjust based on am sugar  Meals take 100-140 Humulin R

## 2020-03-03 NOTE — Progress Notes (Addendum)
Virtual Visit via Video Note   This visit type was conducted due to national recommendations for restrictions regarding the COVID-19 Pandemic (e.g. social distancing) in an effort to limit this patient's exposure and mitigate transmission in our community.  Due to her co-morbid illnesses, this patient is at least at moderate risk for complications without adequate follow up.  This format is felt to be most appropriate for this patient at this time.  All issues noted in this document were discussed and addressed.  A limited physical exam was performed with this format.  Please refer to the patient's chart for her consent to telehealth for [facility].   Patient location : Home Physician location : Office  Date:  03/07/2020    Amy Clarke Date of Birth: [birth year] Medical Record #[id number]  PCP:  Mayra Neer, MD [facility]  Cardiologist:  Gillian Shields     History of Present Illness: Amy Clarke is a 50 y.o. female with history of chest pain   She was seen here back in 2017 for tachycardia and cardiovascular risk factors which include DM, HTN and HLD. EKG with narrow complex tachycardia vs long RP tachycardia.   Echo with normal EF. Other issues include asthma. FH + for father having early CAD with an MI in his 32's.   SSCP seen by PA October 2019 subsequent cath with no significant CAD per Dr Martinique with normal EF and normal LVEDP  She is on insulin as she had multiple side effects mostly GI/Yest infections with oral meds  A1c better but still high 7.4 on 03/02/20   3 of her 4 sons are home They all went to [place]   Monitor 01/28/19 showed no arrhythmia with average HR 92 bpm Only one run of atrial tachycardia 8 beats  Normal circadian drop at night lowest HR 63 bpm   Echo 12/18/18 EF 60-65% no valve disease reviewed  Had COVID in January  Has 4 dogs including a [place] dog all from the shelter  Past Medical History:  Diagnosis Date  . Asthma    . Complication of anesthesia    hard time waking up  . Depression   . Diabetes ([place])   . Dysrhythmia    father died at age 26 due to MI, heart cath in 10/2018, no intervention  . GERD (gastroesophageal reflux disease)   . History of kidney stones   . Hyperlipidemia   . Hypertension   . Kidney cysts   . Kidney stones   . Migraine headache   . PONV (postoperative nausea and vomiting)     Past Surgical History:  Procedure Laterality Date  . ABDOMINAL HYSTERECTOMY    . CARDIAC CATHETERIZATION    . CESAREAN SECTION     x2  . CHOLECYSTECTOMY    . LEFT HEART CATH AND CORONARY ANGIOGRAPHY N/A 10/29/2017   Procedure: LEFT HEART CATH AND CORONARY ANGIOGRAPHY;  Surgeon: Martinique, Shylo Dillenbeck M, MD;  Location: [place] [facility];  Service: Cardiovascular;  Laterality: N/A;  . LITHOTRIPSY     x3  . NASAL SEPTOPLASTY W/ TURBINOPLASTY Bilateral 11/04/2019   Procedure: NASAL SEPTOPLASTY WITH TURBINATE REDUCTION;  Surgeon: Jerrell Belfast, MD;  Location: [place];  Service: ENT;  Laterality: Bilateral;  . none    . TONSILLECTOMY       Medications: Current Meds  Medication Sig  . Albuterol Sulfate 108 (90 Base) MCG/ACT AEPB Inhale 2 puffs into the lungs as needed (shortness  of breath).   Marland Kitchen atorvastatin (LIPITOR) 80 MG tablet 1 tablet in the evening  . budesonide-formoterol (SYMBICORT) 160-4.5 MCG/ACT inhaler Inhale 2 puffs into the lungs 2 (two) times daily as needed (asthma).   . Continuous Blood Gluc Receiver (DEXCOM G6 RECEIVER) DEVI Use to check blood sugars.  . Continuous Blood Gluc Sensor (DEXCOM G6 SENSOR) MISC USE TO CHECK BLOOD SUGAR  . Continuous Blood Gluc Transmit (DEXCOM G6 TRANSMITTER) MISC USE TO CHECK BLOOD SUGAR  . cyanocobalamin (,VITAMIN B-12,) 1000 MCG/ML injection SMARTSIG:Milliliter(s) Injection Once a Month  . ezetimibe (ZETIA) 10 MG tablet Take 1 tablet (10 mg total) by mouth daily.  . fenofibrate (TRICOR) 145 MG tablet Take 1 tablet (145 mg total) by  mouth daily.  Marland Kitchen glucose blood (ONE TOUCH ULTRA TEST) test strip Use as instructed to test 3 times daily  . glucose blood test strip use 1 strip as directed  . insulin glargine, 2 Unit Dial, (TOUJEO MAX SOLOSTAR) 300 UNIT/ML Solostar Pen Inject 110 Units into the skin daily. (Patient taking differently: Inject 80 Units into the skin daily.)  . Insulin Pen Needle 31G X 5 MM MISC Use for insulin and Victoza  . insulin regular human CONCENTRATED (HUMULIN R U-500 KWIKPEN) 500 UNIT/ML kwikpen Inject 120 Units into the skin 4 (four) times daily. INJECT 80 UNITS BEFORE MEALS AND BEDTIME, ADJUST AS DIRECTED (Patient taking differently: Inject 80-110 Units into the skin 4 (four) times daily. INJECT 80-110 UNITS BEFORE MEALS AND BEDTIME, ADJUST AS DIRECTED)  . Iron-Vitamin C 100-250 MG TABS   . Magnesium 250 MG TABS Take 250 mg by mouth daily.  . metoprolol tartrate (LOPRESSOR) 50 MG tablet TAKE (1) TABLET BY MOUTH TWICE DAILY.  Marland Kitchen NOVOLOG FLEXPEN 100 UNIT/ML FlexPen INJECT 120 UNITS UNDER THE SKIN TWICE DAILY (Patient taking differently: INJECT 60 UNITS UNDER THE SKIN TWICE DAILY)  . omeprazole (PRILOSEC) 40 MG capsule Take 80 mg by mouth daily.  . rosuvastatin (CRESTOR) 40 MG tablet Take 1 tablet (40 mg total) by mouth daily.  . valsartan (DIOVAN) 320 MG tablet Take 320 mg by mouth daily.   Marland Kitchen venlafaxine XR (EFFEXOR-XR) 150 MG 24 hr capsule Take 150 mg by mouth daily with breakfast.  . vitamin C (ASCORBIC ACID) 500 MG tablet Take 500 mg by mouth daily.  . Vitamin D, Cholecalciferol, 1000 units CAPS Take 1,000 Units by mouth daily.      Allergies: Allergies  Allergen Reactions  . Wilder Glade [Dapagliflozin] Other (See Comments)    YEAST INFECTION   . Januvia [Sitagliptin] Diarrhea and Nausea And Vomiting  . Lisinopril Cough  . Tanzeum [Albiglutide] Nausea And Vomiting  . Trulicity [Dulaglutide] Nausea Only  . Adhesive [Tape] Rash  . Sulfonamide Derivatives Rash    Social History: The patient   reports that she has never smoked. She has never used smokeless tobacco. She reports current alcohol use. She reports that she does not use drugs.   Family History: The patient's family history includes Diabetes in her maternal grandmother and mother; Thyroid disease in her cousin.   Review of Systems: Please see the history of present illness.   Otherwise, the review of systems is positive for none.   All other systems are reviewed and negative.   Physical Exam: VS:  Ht 4\' 11"  (1.499 m)   Wt 83 kg   BMI 36.96 kg/m  .  BMI Body mass index is 36.96 kg/m.  Wt Readings from Last 3 Encounters:  03/07/20 83 kg  03/02/20 83.4  kg  12/09/19 80.9 kg    No distress No JVP elevation No edema No tachypnea    LABORATORY DATA:  EKG:   12/10/18 ST rate 111 nonspecific ST changes   Lab Results  Component Value Date   WBC 6.3 10/29/2017   HGB 13.1 10/29/2017   HCT 41.5 10/29/2017   PLT 215 10/29/2017   GLUCOSE 153 (H) 11/02/2019   CHOL 220 (H) 10/07/2019   TRIG (H) 10/07/2019    477.0 Triglyceride is over 400; calculations on Lipids are invalid.   HDL 30.60 (L) 10/07/2019   LDLDIRECT 138.0 10/07/2019   LDLCALC 71 03/25/2019   ALT 39 (A) 03/25/2019   AST 35 03/25/2019   NA 139 11/02/2019   K 4.4 11/02/2019   CL 103 11/02/2019   CREATININE 0.60 11/02/2019   BUN 13 11/02/2019   CO2 26 11/02/2019   TSH 0.770 10/28/2017   INR 0.98 10/28/2017   HGBA1C 7.4 (A) 03/02/2020   MICROALBUR 4.9 (H) 03/11/2019     BNP (last 3 results) No results for input(s): BNP in the last 8760 hours.  ProBNP (last 3 results) No results for input(s): PROBNP in the last 8760 hours.   Other Studies Reviewed Today:  LEFT HEART CATH AND CORONARY ANGIOGRAPHY 10/2017  Conclusion     The left ventricular systolic function is normal.  LV end diastolic pressure is normal.  The left ventricular ejection fraction is 55-65% by visual estimate.    1. Normal coronary anatomy 2. Normal LV  function 3. Normal LVEDP  Plan: consider alternative causes of chest pain  No indication for antiplatelet therapy at this time.     GXT Study Highlights 03/2016    Blood pressure demonstrated a normal response to exercise.  There was no ST segment deviation noted during stress.  Normal exercise treadmill stress test. Normal functional capacity. Normal BP response to exertion.     Echo Study Conclusions 06/2015  - Left ventricle: The cavity size was normal. Systolic function was normal. The estimated ejection fraction was in the range of 55% to 60%. Wall motion was normal; there were no regional wall motion abnormalities. Left ventricular diastolic function parameters were normal. - Atrial septum: No defect or patent foramen ovale was identified.  Assessment/Plan:  1. Chest Pain :  Normal cath 10/29/17 observe Normal echo 12/18/18 EF 60-65%  2. Tachycardia:  Improved on beta blocker normal nightly circadian drop   3. DM - uncontrolled by A1C - per PCP need to see eye doctor   4. HLD - on crestor labs with primary   5. Obesity - CV risk factor modification encouraged.   6. Significant situational stress. This may be the culprit of her symptoms.    Current medicines are reviewed with the patient today.  The patient does not have concerns regarding medicines other than what has been noted above.  The following changes have been made:  See above.  Labs/ tests ordered today include:  None   No orders of the defined types were placed in this encounter.   Time: spent reviewing cath, monitor echo direct patient interview and composing note 20 minutes  Disposition:   FU with [place] in a year    Signed: Jenkins Rouge, MD  03/07/2020 8:45 AM  [place] [address] [place], [place]  [address] Phone: [phone] Fax: [phone]

## 2020-03-07 ENCOUNTER — Telehealth (INDEPENDENT_AMBULATORY_CARE_PROVIDER_SITE_OTHER): Payer: BC Managed Care – PPO | Admitting: Cardiovascular Disease

## 2020-03-07 ENCOUNTER — Other Ambulatory Visit: Payer: Self-pay

## 2020-03-07 VITALS — Ht 59.0 in | Wt 183.0 lb

## 2020-03-07 DIAGNOSIS — Z8639 Personal history of other endocrine, nutritional and metabolic disease: Secondary | ICD-10-CM

## 2020-03-07 DIAGNOSIS — R000 Tachycardia, unspecified: Secondary | ICD-10-CM | POA: Diagnosis not present

## 2020-03-07 DIAGNOSIS — R079 Chest pain, unspecified: Secondary | ICD-10-CM | POA: Diagnosis not present

## 2020-03-07 NOTE — Patient Instructions (Addendum)

## 2020-03-11 NOTE — Telephone Encounter (Signed)
Pt stated not taking the victoza due to insurance not covered but taking other diabetes medication.

## 2020-03-18 ENCOUNTER — Other Ambulatory Visit: Payer: Self-pay | Admitting: Endocrinology

## 2020-03-26 ENCOUNTER — Other Ambulatory Visit: Payer: Self-pay | Admitting: Advanced Practice Midwife

## 2020-04-05 DIAGNOSIS — E1122 Type 2 diabetes mellitus with diabetic chronic kidney disease: Secondary | ICD-10-CM | POA: Diagnosis not present

## 2020-04-05 DIAGNOSIS — E782 Mixed hyperlipidemia: Secondary | ICD-10-CM | POA: Diagnosis not present

## 2020-04-05 DIAGNOSIS — Z0000 Encounter for general adult medical examination without abnormal findings: Secondary | ICD-10-CM | POA: Diagnosis not present

## 2020-04-05 DIAGNOSIS — R8290 Unspecified abnormal findings in urine: Secondary | ICD-10-CM | POA: Diagnosis not present

## 2020-04-05 DIAGNOSIS — R809 Proteinuria, unspecified: Secondary | ICD-10-CM | POA: Diagnosis not present

## 2020-04-06 ENCOUNTER — Other Ambulatory Visit: Payer: Self-pay | Admitting: Family Medicine

## 2020-04-06 DIAGNOSIS — Z1231 Encounter for screening mammogram for malignant neoplasm of breast: Secondary | ICD-10-CM

## 2020-04-28 ENCOUNTER — Ambulatory Visit (INDEPENDENT_AMBULATORY_CARE_PROVIDER_SITE_OTHER): Payer: Self-pay | Admitting: Bariatrics

## 2020-04-29 ENCOUNTER — Ambulatory Visit: Payer: BC Managed Care – PPO | Admitting: Endocrinology

## 2020-05-04 ENCOUNTER — Other Ambulatory Visit: Payer: Self-pay | Admitting: Endocrinology

## 2020-05-04 ENCOUNTER — Other Ambulatory Visit: Payer: Self-pay | Admitting: Cardiovascular Disease

## 2020-05-06 DIAGNOSIS — G5600 Carpal tunnel syndrome, unspecified upper limb: Secondary | ICD-10-CM | POA: Insufficient documentation

## 2020-05-06 DIAGNOSIS — G5603 Carpal tunnel syndrome, bilateral upper limbs: Secondary | ICD-10-CM | POA: Diagnosis not present

## 2020-05-11 ENCOUNTER — Other Ambulatory Visit: Payer: Self-pay | Admitting: Endocrinology

## 2020-05-11 DIAGNOSIS — Z794 Long term (current) use of insulin: Secondary | ICD-10-CM

## 2020-05-11 DIAGNOSIS — E1165 Type 2 diabetes mellitus with hyperglycemia: Secondary | ICD-10-CM

## 2020-05-11 NOTE — Telephone Encounter (Signed)
Sent Rx Dexcom sensor --to [facility]

## 2020-05-12 ENCOUNTER — Ambulatory Visit (INDEPENDENT_AMBULATORY_CARE_PROVIDER_SITE_OTHER): Payer: BC Managed Care – PPO | Admitting: Bariatrics

## 2020-05-27 ENCOUNTER — Other Ambulatory Visit: Payer: Self-pay | Admitting: Endocrinology

## 2020-06-01 ENCOUNTER — Other Ambulatory Visit: Payer: Self-pay

## 2020-06-01 ENCOUNTER — Encounter: Payer: Self-pay | Admitting: Endocrinology

## 2020-06-01 ENCOUNTER — Ambulatory Visit: Payer: BC Managed Care – PPO | Admitting: Endocrinology

## 2020-06-01 VITALS — BP 130/84 | HR 77 | Ht 60.0 in | Wt 184.4 lb

## 2020-06-01 DIAGNOSIS — E1165 Type 2 diabetes mellitus with hyperglycemia: Secondary | ICD-10-CM

## 2020-06-01 DIAGNOSIS — E782 Mixed hyperlipidemia: Secondary | ICD-10-CM

## 2020-06-01 DIAGNOSIS — Z794 Long term (current) use of insulin: Secondary | ICD-10-CM

## 2020-06-01 MED ORDER — NOVOLOG FLEXPEN 100 UNIT/ML ~~LOC~~ SOPN
PEN_INJECTOR | SUBCUTANEOUS | 3 refills | Status: DC
Start: 2020-06-01 — End: 2020-09-29

## 2020-06-01 NOTE — Patient Instructions (Addendum)
U-500 Take 65-70 in am, 80-110 at lunch and 140 at supper  Toujeo 65 and adjust weekly 5 units to keep am sugar 90-130   Stop Ezetemibe

## 2020-06-01 NOTE — Progress Notes (Signed)
Patient ID: Amy Clarke, female   DOB: 1970-12-02, 50 y.o.   MRN: [id number]           Reason for Appointment:  Follow-up for Type 2 Diabetes  Referring physician: Mayra Neer   History of Present Illness:          Date of diagnosis of type 2 diabetes mellitus: 2003?         Background history:   She had gestational diabetes in 1992 and subsequently was on diet alone She thinks she was started on diabetes medication about 15 years ago and probably took metformin which she took until about a year ago and this was stopped because of diarrhea.  Records show that she was taking 1000 mg of regular metformin twice a day With her PCP she has been on numerous diabetes medications over the years including Byetta, Trulicity, Jardiance and Januvia which apparently all caused side effects, mostly nausea or yeast infections with Vania Rea and Farxiga Her A1c has been mostly higher this year, last year has been as low as 7.2, in January her A1c was 8.4  Recent history:   INSULIN regimen is:  Toujeo 80 units in the morning.  Humulin R U-500, 80- 110 units 2 or 3 times daily before meals   Non-insulin hypoglycemic drugs the patient is taking are: None   Current management, blood sugar patterns and problems identified:  Her A1c is 7.5 done by PCP in 3/22   The following patterns are seen on her Dexcom, interpretation as below   HIGHEST blood sugars are on an average late at night averaging 249 between 10-11 PM  LOWEST blood sugars are an average of 117 between 10-11 AM  She had more variability in her blood sugars in the late afternoon and evening  Blood sugars are above target range mostly between 3 PM-3 AM  Lowest blood sugars are around 4-6 AM or 10 AM but usually not dropping below normal except transiently  POSTPRANDIAL readings appear to be mostly even after breakfast but gradually rising in the afternoon and more gradually progressively increasing after about 7 PM with  highest readings after evening meal  HYPOGLYCEMIA is less in the last week with only 1 occurrence early morning but otherwise in week #1 it was more often   Recently she still is not adjusting her insulin to control her postprandial readings adequately especially in the evenings Previously was advised to take at least 20 to 30 units more for dinner meal but blood sugars are averaging 250 late in the evening However blood sugar patterns are still indicating fairly good control during the daytime and likely she is not eating a full meal usually  She also has not reduced her Toujeo to avoid early morning low normal or low sugars  Still taking 80 units  She states she is mostly eating full meal at dinnertime but generally more snacks during the day  She still takes 80 units before drinking coffee in the morning with or without food  Otherwise she will still take 80-100 units before lunch depending on what she is eating  Before dinnertime she usually does not take more than 110 units and she thinks that it takes up to 1 hour for the insulin to work  She was told to take NovoLog for higher carbohydrate meals but pharmacist did not fill this prescription  No significant hypoglycemia currently  She is not doing any exercise program  Weight is slightly higher  Side effects from medications have been: Diarhea with regular metformin, frequent candidiasis from [place], nausea from [place] are 10 AM, 2 PM and 6 PM, may skip breakfast  CGM use % of time  93  2-week average  178+/-63  Time in range    52    %  % Time Above 180  31  % Time above 250  15  % Time Below 70 1   Blood sugar averages at different times as above    Previous data:  CGM use % of time  92  2-week average/SD  168/54  Time in range     55   %  % Time Above 180 41  % Time above 250 6  % Time Below 70 3.2      Self-care: The diet that the patient has been following is: tries to limit  high-fat foods and drinks with sugar .     Typical meal intake: Breakfast is sometimes cereal/bagel/granola Bar.  Usually eating breakfast only if she is working  [employer] usually yogurt, cheese, fruit and crackers.  Dinner chicken with vegetables.  She will have snacks with granola bar or popcorn                Dietician visit, most recent: Several years ago in class                Weight history:  Wt Readings from Last 3 Encounters:  06/01/20 184 lb 6.4 oz (83.6 kg)  03/07/20 183 lb (83 kg)  03/02/20 183 lb 12.8 oz (83.4 kg)    Glycemic control:   Lab Results  Component Value Date   HGBA1C 7.4 (A) 03/02/2020   HGBA1C 9.0 (H) 10/07/2019   HGBA1C 9.2 (H) 03/11/2019   Lab Results  Component Value Date   MICROALBUR 4.9 (H) 03/11/2019   LDLCALC 71 03/25/2019   CREATININE 0.60 11/02/2019   Lab Results  Component Value Date   MICRALBCREAT 3.4 03/11/2019    Lab Results  Component Value Date   FRUCTOSAMINE 278 10/31/2018   FRUCTOSAMINE 265 07/18/2018   FRUCTOSAMINE 372 (H) 09/10/2017      Allergies as of 06/01/2020      Reactions   Farxiga [dapagliflozin] Other (See Comments)   YEAST INFECTION   Januvia [sitagliptin] Diarrhea, Nausea And Vomiting   Lisinopril Cough   Tanzeum [albiglutide] Nausea And Vomiting   Trulicity [dulaglutide] Nausea Only   Adhesive [tape] Rash   Sulfonamide Derivatives Rash      Medication List       Accurate as of Jun 01, 2020  2:39 PM. If you have any questions, ask your nurse or doctor.        Albuterol Sulfate 108 (90 Base) MCG/ACT Aepb Commonly known as: PROAIR RESPICLICK Inhale 2 puffs into the lungs as needed (shortness of breath).   atorvastatin 80 MG tablet Commonly known as: LIPITOR 1 tablet in the evening   budesonide-formoterol 160-4.5 MCG/ACT inhaler Commonly known as: SYMBICORT Inhale 2 puffs into the lungs 2 (two) times daily as needed (asthma).   cyanocobalamin 1000 MCG/ML injection Commonly known as: (VITAMIN  B-12) SMARTSIG:Milliliter(s) Injection Once a Month   Dexcom G6 Receiver Devi Use to check blood sugars.   Dexcom G6 Sensor Misc USE TO CHECK BLOOD SUGAR   Dexcom G6 Transmitter Misc USE TO CHECK BLOOD SUGAR   ezetimibe 10 MG tablet Commonly known as: ZETIA TAKE (1) TABLET BY MOUTH ONCE DAILY.   fenofibrate 145 MG tablet  Commonly known as: TRICOR TAKE (1) TABLET BY MOUTH ONCE DAILY.   glucose blood test strip use 1 strip as directed   glucose blood test strip Commonly known as: ONE TOUCH ULTRA TEST Use as instructed to test 3 times daily   HumuLIN R U-500 KwikPen 500 UNIT/ML kwikpen Generic drug: insulin regular human CONCENTRATED Inject 80-110 Units into the skin 4 (four) times daily. INJECT 80-110 UNITS BEFORE MEALS AND BEDTIME, ADJUST AS DIRECTED   Insulin Pen Needle 31G X 5 MM Misc Use for insulin and Victoza   Iron-Vitamin C 100-250 MG Tabs   Magnesium 250 MG Tabs Take 250 mg by mouth daily.   metoprolol tartrate 50 MG tablet Commonly known as: LOPRESSOR TAKE (1) TABLET BY MOUTH TWICE DAILY.   NovoLOG FlexPen 100 UNIT/ML FlexPen Generic drug: insulin aspart INJECT 120 UNITS UNDER THE SKIN TWICE DAILY   omeprazole 40 MG capsule Commonly known as: PRILOSEC Take 80 mg by mouth daily.   rosuvastatin 40 MG tablet Commonly known as: Crestor Take 1 tablet (40 mg total) by mouth daily.   Toujeo Max SoloStar 300 UNIT/ML Solostar Pen Generic drug: insulin glargine (2 Unit Dial) Inject 110 Units into the skin daily. What changed: how much to take   valsartan 320 MG tablet Commonly known as: DIOVAN Take 320 mg by mouth daily.   venlafaxine XR 150 MG 24 hr capsule Commonly known as: EFFEXOR-XR Take 150 mg by mouth daily with breakfast.   vitamin C 500 MG tablet Commonly known as: ASCORBIC ACID Take 500 mg by mouth daily.   Vitamin D (Cholecalciferol) 25 MCG (1000 UT) Caps Take 1,000 Units by mouth daily.       Allergies:  Allergies  Allergen  Reactions  . Wilder Glade [Dapagliflozin] Other (See Comments)    YEAST INFECTION   . Januvia [Sitagliptin] Diarrhea and Nausea And Vomiting  . Lisinopril Cough  . Tanzeum [Albiglutide] Nausea And Vomiting  . Trulicity [Dulaglutide] Nausea Only  . Adhesive [Tape] Rash  . Sulfonamide Derivatives Rash    Past Medical History:  Diagnosis Date  . Asthma   . Complication of anesthesia    hard time waking up  . Depression   . Diabetes ([place])   . Dysrhythmia    father died at age 82 due to MI, heart cath in 10/2018, no intervention  . GERD (gastroesophageal reflux disease)   . History of kidney stones   . Hyperlipidemia   . Hypertension   . Kidney cysts   . Kidney stones   . Migraine headache   . PONV (postoperative nausea and vomiting)     Past Surgical History:  Procedure Laterality Date  . ABDOMINAL HYSTERECTOMY    . CARDIAC CATHETERIZATION    . CESAREAN SECTION     x2  . CHOLECYSTECTOMY    . LEFT HEART CATH AND CORONARY ANGIOGRAPHY N/A 10/29/2017   Procedure: LEFT HEART CATH AND CORONARY ANGIOGRAPHY;  Surgeon: Martinique, Peter M, MD;  Location: [facility];  Service: Cardiovascular;  Laterality: N/A;  . LITHOTRIPSY     x3  . NASAL SEPTOPLASTY W/ TURBINOPLASTY Bilateral 11/04/2019   Procedure: NASAL SEPTOPLASTY WITH TURBINATE REDUCTION;  Surgeon: Jerrell Belfast, MD;  Location: [facility];  Service: ENT;  Laterality: Bilateral;  . none    . TONSILLECTOMY      Family History  Problem Relation Age of Onset  . Diabetes Mother   . Diabetes Maternal Grandmother   . Thyroid disease Cousin  Social History:  reports that she has never smoked. She has never used smokeless tobacco. She reports current alcohol use. She reports that she does not use drugs.   Review of Systems   HYPERLIPIDEMIA:  She has mixed hyperlipidemia She has started back on atorvastatin from her PCP Also taking Zetia and now on fenofibrate  Labs done by PCP on 04/05/2020 showed  cholesterol 112, LDL 48 and triglycerides 186    Lab Results  Component Value Date   CHOL 220 (H) 10/07/2019   CHOL 163 03/25/2019   CHOL 133 07/18/2018   Lab Results  Component Value Date   HDL 30.60 (L) 10/07/2019   HDL 33.90 (L) 07/18/2018   HDL 33.20 (L) 05/08/2018   Lab Results  Component Value Date   LDLCALC 71 03/25/2019   Lab Results  Component Value Date   TRIG (H) 10/07/2019    477.0 Triglyceride is over 400; calculations on Lipids are invalid.   TRIG 286.0 (H) 07/18/2018   TRIG (H) 05/08/2018    451.0 Triglyceride is over 400; calculations on Lipids are invalid.   Lab Results  Component Value Date   CHOLHDL 7 10/07/2019   CHOLHDL 4 07/18/2018   CHOLHDL 6 05/08/2018   Lab Results  Component Value Date   LDLDIRECT 138.0 10/07/2019   LDLDIRECT 79.0 07/18/2018   LDLDIRECT 125.0 05/08/2018            Hypertension: Blood pressure has been high since her 37s, managed by PCP with valsartan 320 mg Blood pressure with PCP was 120/80  BP Readings from Last 3 Encounters:  06/01/20 130/84  03/02/20 126/84  12/09/19 124/82   Microalbuminuria: Last microalbumin ratio 73.6, done on 2/20  Most recent eye exam was In 04/2016  Most recent foot exam: 03/2018    Physical Examination:  BP 130/84   Pulse 77   Ht 5' (1.524 m)   Wt 184 lb 6.4 oz (83.6 kg)   SpO2 97%   BMI 36.01 kg/m       ASSESSMENT:  Diabetes type 2, uncontrolled with BMI 35  See history of present illness for detailed discussion of current diabetes management, blood sugar patterns and problems identified  Her A1c is last 7.5 and stable  She has had insulin resistance and requiring large doses of insulin However she still is not adjusting her insulin to control her postprandial readings adequately especially in the evenings Previously was advised to take at least 20 to 30 units more for dinner meal but blood sugars are averaging 250 late in the evening However blood sugar patterns are  still indicating fairly good control during the daytime and likely she is not eating a full meal usually She also has not reduced her Toujeo to avoid early morning low normal or low sugars This is despite giving her a flowsheet to make adjustments on her basal insulin which she does not want to fill out  Blood sugars overall better likely from more regular use of the Humulin R and requiring less basal insulin as above Most of her hyperglycemia is postprandial but also somewhat inconsistent Has tendency to occasional hypoglycemia early morning which is variable also  HYPERLIPIDEMIA: Lipids last done by PCP showed relatively low LDL of 48 With triglycerides of 186 she is likely benefiting from adding fenofibrate also However can benefit from further weight loss  HYPERTENSION: Blood pressure is fairly well controlled, now may be high normal in the office    PLAN:    She needs  to take higher doses of Humulin R for lunch and especially the dinner meal coverage  She will go up to 140 units at suppertime +/- 10 units  She needs to target postprandial readings under 180  Will need to avoid low blood sugars overnight from Toujeo  This will be reduced to 65 units for now and she can adjust this further especially if she needs to go up further on the Humulin R at suppertime  Can reduce her Humulin R in the morning but go up 10 to 20 units before lunch  She will need to take her Humulin R 20 to 40 minutes before planned meals  Consider OmniPod 5 pump and will fax the information to the company to see if she can afford it now  Continue modifying diet with low-fat carbohydrate intake  regular walking for exercise  Low-fat diet   Stop Zetia, continue fenofibrate   Follow-up in 2 months     Patient Instructions  U-500 Take 65-70 in am, 80-110 at lunch and 140 at supper  Toujeo 65 and adjust weekly 5 units to keep am sugar 90-130   Stop Ezetemibe           Elayne Snare 06/01/2020, 2:39 PM   Note: This office note was prepared with Dragon voice recognition system technology. Any transcriptional errors that result from this process are unintentional.

## 2020-06-06 DIAGNOSIS — N61 Mastitis without abscess: Secondary | ICD-10-CM | POA: Diagnosis not present

## 2020-06-17 DIAGNOSIS — G5603 Carpal tunnel syndrome, bilateral upper limbs: Secondary | ICD-10-CM | POA: Diagnosis not present

## 2020-06-17 DIAGNOSIS — M79642 Pain in left hand: Secondary | ICD-10-CM | POA: Diagnosis not present

## 2020-06-17 DIAGNOSIS — M79641 Pain in right hand: Secondary | ICD-10-CM | POA: Diagnosis not present

## 2020-06-20 DIAGNOSIS — N6459 Other signs and symptoms in breast: Secondary | ICD-10-CM | POA: Diagnosis not present

## 2020-06-20 DIAGNOSIS — R922 Inconclusive mammogram: Secondary | ICD-10-CM | POA: Diagnosis not present

## 2020-06-25 ENCOUNTER — Other Ambulatory Visit: Payer: Self-pay | Admitting: Endocrinology

## 2020-06-25 ENCOUNTER — Other Ambulatory Visit: Payer: Self-pay | Admitting: Advanced Practice Midwife

## 2020-06-25 DIAGNOSIS — E1165 Type 2 diabetes mellitus with hyperglycemia: Secondary | ICD-10-CM

## 2020-06-25 DIAGNOSIS — Z794 Long term (current) use of insulin: Secondary | ICD-10-CM

## 2020-07-07 DIAGNOSIS — L720 Epidermal cyst: Secondary | ICD-10-CM | POA: Diagnosis not present

## 2020-07-22 ENCOUNTER — Other Ambulatory Visit: Payer: Self-pay | Admitting: Advanced Practice Midwife

## 2020-07-27 NOTE — Telephone Encounter (Signed)
Send to Mayra Neer MD

## 2020-07-28 DIAGNOSIS — L723 Sebaceous cyst: Secondary | ICD-10-CM | POA: Diagnosis not present

## 2020-07-28 DIAGNOSIS — L720 Epidermal cyst: Secondary | ICD-10-CM | POA: Diagnosis not present

## 2020-07-29 ENCOUNTER — Ambulatory Visit: Payer: BC Managed Care – PPO

## 2020-08-08 ENCOUNTER — Other Ambulatory Visit: Payer: Self-pay

## 2020-08-08 ENCOUNTER — Other Ambulatory Visit (INDEPENDENT_AMBULATORY_CARE_PROVIDER_SITE_OTHER): Payer: BC Managed Care – PPO

## 2020-08-08 DIAGNOSIS — E1165 Type 2 diabetes mellitus with hyperglycemia: Secondary | ICD-10-CM

## 2020-08-08 DIAGNOSIS — Z794 Long term (current) use of insulin: Secondary | ICD-10-CM

## 2020-08-08 DIAGNOSIS — E782 Mixed hyperlipidemia: Secondary | ICD-10-CM

## 2020-08-08 LAB — COMPREHENSIVE METABOLIC PANEL
ALT: 36 U/L — ABNORMAL HIGH (ref 0–35)
AST: 31 U/L (ref 0–37)
Albumin: 4.1 g/dL (ref 3.5–5.2)
Alkaline Phosphatase: 82 U/L (ref 39–117)
BUN: 10 mg/dL (ref 6–23)
CO2: 26 mEq/L (ref 19–32)
Calcium: 9.6 mg/dL (ref 8.4–10.5)
Chloride: 102 mEq/L (ref 96–112)
Creatinine, Ser: 0.55 mg/dL (ref 0.40–1.20)
GFR: 106.16 mL/min (ref >60.00)
Glucose, Bld: 224 mg/dL — ABNORMAL HIGH (ref 70–99)
Potassium: 4.4 mEq/L (ref 3.5–5.1)
Sodium: 138 mEq/L (ref 135–145)
Total Bilirubin: 0.5 mg/dL (ref 0.2–1.2)
Total Protein: 6.9 g/dL (ref 6.0–8.3)

## 2020-08-08 LAB — LIPID PANEL
Cholesterol: 153 mg/dL (ref 0–200)
HDL: 29.8 mg/dL — ABNORMAL LOW (ref >39.00)
NonHDL: 123.2
Total CHOL/HDL Ratio: 5
Triglycerides: 348 mg/dL — ABNORMAL HIGH (ref 0.0–149.0)
VLDL: 69.6 mg/dL — ABNORMAL HIGH (ref 0.0–40.0)

## 2020-08-08 LAB — MICROALBUMIN / CREATININE URINE RATIO
Creatinine,U: 161.3 mg/dL
Microalb Creat Ratio: 6.6 mg/g (ref 0.0–30.0)
Microalb, Ur: 10.7 mg/dL — ABNORMAL HIGH (ref 0.0–1.9)

## 2020-08-08 LAB — TSH: TSH: 2.3 u[IU]/mL (ref 0.35–5.50)

## 2020-08-08 LAB — LDL CHOLESTEROL, DIRECT: Direct LDL: 95 mg/dL

## 2020-08-08 LAB — HEMOGLOBIN A1C: Hgb A1c MFr Bld: 7.2 % — ABNORMAL HIGH (ref 4.6–6.5)

## 2020-08-09 ENCOUNTER — Telehealth: Payer: Self-pay | Admitting: Endocrinology

## 2020-08-09 NOTE — Telephone Encounter (Signed)
Pt would like to know if it would be ok to switch her appt to a MyChart VV on Thurs? Pt states she already had her lab work and will be out of town on that day.

## 2020-08-10 NOTE — Telephone Encounter (Signed)
Notified pt ok for VV if bring dexcom data

## 2020-08-11 ENCOUNTER — Ambulatory Visit: Payer: BC Managed Care – PPO | Admitting: Endocrinology

## 2020-08-24 ENCOUNTER — Encounter: Payer: Self-pay | Admitting: Endocrinology

## 2020-08-24 ENCOUNTER — Other Ambulatory Visit: Payer: Self-pay

## 2020-08-24 ENCOUNTER — Ambulatory Visit: Payer: BC Managed Care – PPO | Admitting: Endocrinology

## 2020-08-24 VITALS — BP 122/82 | HR 81 | Ht 60.0 in | Wt 188.0 lb

## 2020-08-24 DIAGNOSIS — E782 Mixed hyperlipidemia: Secondary | ICD-10-CM

## 2020-08-24 DIAGNOSIS — E1165 Type 2 diabetes mellitus with hyperglycemia: Secondary | ICD-10-CM

## 2020-08-24 DIAGNOSIS — I10 Essential (primary) hypertension: Secondary | ICD-10-CM

## 2020-08-24 DIAGNOSIS — Z794 Long term (current) use of insulin: Secondary | ICD-10-CM

## 2020-08-24 NOTE — Patient Instructions (Addendum)
Exercise on [place] every pm  Take Lipid Meds in am  Take 10-30 Units Novolog for 8-9 pm snacks,   OK to cut Toujeo 6-10 units if low in am

## 2020-08-24 NOTE — Progress Notes (Signed)
Patient ID: Amy Clarke, female   DOB: 1970-01-30, 50 y.o.   MRN: [id number]           Reason for Appointment:  Follow-up for Type 2 Diabetes  Referring physician: Mayra Neer   History of Present Illness:          Date of diagnosis of type 2 diabetes mellitus: 2003?         Background history:   She had gestational diabetes in 1992 and subsequently was on diet alone She thinks she was started on diabetes medication about 15 years ago and probably took metformin which she took until about a year ago and this was stopped because of diarrhea.  Records show that she was taking 1000 mg of regular metformin twice a day With her PCP she has been on numerous diabetes medications over the years including Byetta, Trulicity, Jardiance and Januvia which apparently all caused side effects, mostly nausea or yeast infections with Vania Rea and Farxiga Her A1c has been mostly higher this year, last year has been as low as 7.2, in January her A1c was 8.4  Recent history:   INSULIN regimen is:  Toujeo 80 units in the morning.  Humulin R U-500, 80- 120 units 2 or 3 times daily before meals   Non-insulin hypoglycemic drugs the patient is taking are: None   Current management, blood sugar patterns and problems identified:  Her A1c is 7.2, slightly better  The following patterns are seen on her Dexcom, interpretation as below  Blood sugar shows significant variability at most times except late evening  HIGHEST blood sugars are after about 9 PM for about 5 to 6 hours and fairly consistent with highest average 222 between 10 PM-12 AM OVERNIGHT blood sugars are higher in the second week compared to the first week.  They usually start off averaging near 200 and then decrease her hypoglycemia has been only occasional and transient around 4 AM  POSTPRANDIAL readings are variably high after lunch but not consistent Postprandial readings after dinner daily well-controlled evenly until 8-9 PM She has  consistently high blood sugars starting around 9 PM until at least 1 AM  HYPOGLYCEMIA has been occasional and transient around 4-5 AM-early morning  FASTING blood sugars in the mornings are about 138-150 range  Daily management:  Her A1c is better but her average blood sugar is higher as usual with only 55% blood sugars within range  she has high readings after snacking in the evenings after 8-9 PM with blood sugars occasionally going up over 300  She did not take any insulin for this  Also she thinks she is afraid of taking insulin for evening snacks because of occasional low sugars early morning and she thinks that her sugars need to be about 200 at bedtime As before postprandial readings are somewhat variable especially during the day but not as much after dinner based on her carbohydrate intake  She has adjusted her dinner dose based on how much she is eating  Most of the time she is able to take her insulin consistently before starting to eat up to 30 minutes  she is mostly eating full meal at dinnertime but generally more snacks during the day She still takes 80 units before drinking coffee in the morning with or without food Has not changed her Toujeo insulin dose She is not doing any exercise program Weight is slightly higher again       Side effects from medications have been: Diarhea with  regular metformin, frequent candidiasis from Jardiance, nausea from [place] are 10 AM, 2 PM and 6 PM, may skip breakfast  CGM use % of time 88  2-week average/GV 171+/-53  Time in range       55% was 52  % Time Above 180 43  % Time above 250 5  % Time Below 70 1.9     Previous data:  CGM use % of time  93  2-week average  178+/-63  Time in range    52    %  % Time Above 180  31  % Time above 250  15  % Time Below 70 1     Self-care: The diet that the patient has been following is: tries to limit high-fat foods and drinks with sugar .     Typical meal intake: Breakfast  is sometimes cereal/bagel/granola Bar.  Usually eating breakfast only if she is working  [employer] usually yogurt, cheese, fruit and crackers.  Dinner chicken with vegetables.  She will have snacks with granola bar or popcorn                Dietician visit, most recent: Several years ago in class                Weight history:  Wt Readings from Last 3 Encounters:  08/24/20 188 lb (85.3 kg)  06/01/20 184 lb 6.4 oz (83.6 kg)  03/07/20 183 lb (83 kg)    Glycemic control:   Lab Results  Component Value Date   HGBA1C 7.2 (H) 08/08/2020   HGBA1C 7.4 (A) 03/02/2020   HGBA1C 9.0 (H) 10/07/2019   Lab Results  Component Value Date   MICROALBUR 10.7 (H) 08/08/2020   LDLCALC 71 03/25/2019   CREATININE 0.55 08/08/2020   Lab Results  Component Value Date   MICRALBCREAT 6.6 08/08/2020    Lab Results  Component Value Date   FRUCTOSAMINE 278 10/31/2018   FRUCTOSAMINE 265 07/18/2018   FRUCTOSAMINE 372 (H) 09/10/2017      Allergies as of 08/24/2020       Reactions   Farxiga [dapagliflozin] Other (See Comments)   YEAST INFECTION   Januvia [sitagliptin] Diarrhea, Nausea And Vomiting   Lisinopril Cough   Tanzeum [albiglutide] Nausea And Vomiting   Trulicity [dulaglutide] Nausea Only   Adhesive [tape] Rash   Sulfonamide Derivatives Rash        Medication List        Accurate as of August 24, 2020 11:59 PM. If you have any questions, ask your nurse or doctor.          Albuterol Sulfate 108 (90 Base) MCG/ACT Aepb Commonly known as: PROAIR RESPICLICK Inhale 2 puffs into the lungs as needed (shortness of breath).   atorvastatin 80 MG tablet Commonly known as: LIPITOR 1 tablet in the evening   budesonide-formoterol 160-4.5 MCG/ACT inhaler Commonly known as: SYMBICORT Inhale 2 puffs into the lungs 2 (two) times daily as needed (asthma).   cyanocobalamin 1000 MCG/ML injection Commonly known as: (VITAMIN B-12) SMARTSIG:Milliliter(s) Injection Once a Month   Dexcom G6  Receiver Devi Use to check blood sugars.   Dexcom G6 Sensor Misc USE TO CHECK BLOOD SUGAR   Dexcom G6 Transmitter Misc USE TO CHECK BLOOD SUGAR   ezetimibe 10 MG tablet Commonly known as: ZETIA TAKE (1) TABLET BY MOUTH ONCE DAILY.   fenofibrate 145 MG tablet Commonly known as: TRICOR TAKE (1) TABLET BY MOUTH ONCE DAILY.   glucose blood test  strip use 1 strip as directed   glucose blood test strip Commonly known as: ONE TOUCH ULTRA TEST Use as instructed to test 3 times daily   HumuLIN R U-500 KwikPen 500 UNIT/ML KwikPen Generic drug: insulin regular human CONCENTRATED Inject 80-110 Units into the skin 4 (four) times daily. INJECT 80-110 UNITS BEFORE MEALS AND BEDTIME, ADJUST AS DIRECTED   Insulin Pen Needle 31G X 5 MM Misc Use for insulin and Victoza   Iron-Vitamin C 100-250 MG Tabs   Magnesium 250 MG Tabs Take 250 mg by mouth daily.   metoprolol tartrate 50 MG tablet Commonly known as: LOPRESSOR TAKE (1) TABLET BY MOUTH TWICE DAILY.   NovoLOG FlexPen 100 UNIT/ML FlexPen Generic drug: insulin aspart Inject 30 to 50 units before dinnertime for high carbohydrate meals   omeprazole 40 MG capsule Commonly known as: PRILOSEC Take 80 mg by mouth daily.   rosuvastatin 40 MG tablet Commonly known as: Crestor Take 1 tablet (40 mg total) by mouth daily.   Toujeo Max SoloStar 300 UNIT/ML Solostar Pen Generic drug: insulin glargine (2 Unit Dial) Inject 110 Units into the skin daily. What changed: how much to take   valsartan 320 MG tablet Commonly known as: DIOVAN Take 320 mg by mouth daily.   venlafaxine XR 150 MG 24 hr capsule Commonly known as: EFFEXOR-XR Take 150 mg by mouth daily with breakfast.   vitamin C 500 MG tablet Commonly known as: ASCORBIC ACID Take 500 mg by mouth daily.   Vitamin D (Cholecalciferol) 25 MCG (1000 UT) Caps Take 1,000 Units by mouth daily.        Allergies:  Allergies  Allergen Reactions   Farxiga [Dapagliflozin] Other  (See Comments)    YEAST INFECTION    Januvia [Sitagliptin] Diarrhea and Nausea And Vomiting   Lisinopril Cough   Tanzeum [Albiglutide] Nausea And Vomiting   Trulicity [Dulaglutide] Nausea Only   Adhesive [Tape] Rash   Sulfonamide Derivatives Rash    Past Medical History:  Diagnosis Date   Asthma    Complication of anesthesia    hard time waking up   Depression    Diabetes [facility])    Dysrhythmia    father died at age 68 due to MI, heart cath in 10/2018, no intervention   GERD (gastroesophageal reflux disease)    History of kidney stones    Hyperlipidemia    Hypertension    Kidney cysts    Kidney stones    Migraine headache    PONV (postoperative nausea and vomiting)     Past Surgical History:  Procedure Laterality Date   ABDOMINAL HYSTERECTOMY     CARDIAC CATHETERIZATION     CESAREAN SECTION     x2   CHOLECYSTECTOMY     LEFT HEART CATH AND CORONARY ANGIOGRAPHY N/A 10/29/2017   Procedure: LEFT HEART CATH AND CORONARY ANGIOGRAPHY;  Surgeon: Martinique, Peter M, MD;  Location: [facility];  Service: Cardiovascular;  Laterality: N/A;   LITHOTRIPSY     x3   NASAL SEPTOPLASTY W/ TURBINOPLASTY Bilateral 11/04/2019   Procedure: NASAL SEPTOPLASTY WITH TURBINATE REDUCTION;  Surgeon: Jerrell Belfast, MD;  Location: [facility];  Service: ENT;  Laterality: Bilateral;   none     TONSILLECTOMY      Family History  Problem Relation Age of Onset   Diabetes Mother    Diabetes Maternal Grandmother    Thyroid disease Cousin     Social History:  reports that she has never smoked. She has never  used smokeless tobacco. She reports current alcohol use. She reports that she does not use drugs.   Review of Systems   HYPERLIPIDEMIA:  She has mixed hyperlipidemia She has been on atorvastatin from her PCP Also taking Zetia and fenofibrate However she forgets to take her medications especially fenofibrate in the evenings  Triglycerides are consistently high     Lab Results  Component Value Date   CHOL 153 08/08/2020   CHOL 220 (H) 10/07/2019   CHOL 163 03/25/2019   Lab Results  Component Value Date   HDL 29.80 (L) 08/08/2020   HDL 30.60 (L) 10/07/2019   HDL 33.90 (L) 07/18/2018   Lab Results  Component Value Date   LDLCALC 71 03/25/2019   Lab Results  Component Value Date   TRIG 348.0 (H) 08/08/2020   TRIG (H) 10/07/2019    477.0 Triglyceride is over 400; calculations on Lipids are invalid.   TRIG 286.0 (H) 07/18/2018   Lab Results  Component Value Date   CHOLHDL 5 08/08/2020   CHOLHDL 7 10/07/2019   CHOLHDL 4 07/18/2018   Lab Results  Component Value Date   LDLDIRECT 95.0 08/08/2020   LDLDIRECT 138.0 10/07/2019   LDLDIRECT 79.0 07/18/2018            Hypertension: Blood pressure has been high since her 20s, managed by PCP with valsartan 320 mg   BP Readings from Last 3 Encounters:  08/24/20 122/82  06/01/20 130/84  03/02/20 126/84   Microalbuminuria: This is back to normal  Most recent eye exam was In 3/22  Most recent foot exam: 03/2018    Physical Examination:  BP 122/82 (BP Location: Left Arm, Patient Position: Sitting, Cuff Size: Normal)   Pulse 81   Ht 5' (1.524 m)   Wt 188 lb (85.3 kg)   SpO2 97%   BMI 36.72 kg/m       ASSESSMENT:  Diabetes type 2, insulin requiring  See history of present illness for detailed discussion of current diabetes management, blood sugar patterns and problems identified  Her A1c is now 7.2  She has had insulin resistance and requiring large doses of insulin Overall still benefiting from having the Dexcom system for monitoring  Her A1c is lower than expected for her home blood sugar average of 171 with only 55% of readings within target and frequently high readings postprandially Her main difficulty is not covering snacks late in the evening after supper which is causing significant hyperglycemia for at least 4 to 5 hours She is afraid of hypoglycemia overnight  but discussed that she can reduce this by decreasing her basal insulin Also can do better with exercise and weight loss As before has not been interested in insulin pump  Blood sugar fluctuation is also present Her day-to-day management and blood sugar patterns as well as causes of high and low blood sugars discussed in detail  She has been intolerant to GLP-1 drugs and SGLT2 drugs  HYPERLIPIDEMIA: Triglycerides are poorly controlled possibly because of not taking her medication regularly as she forgets in the evenings LDL is better  HYPERTENSION: Blood pressure is well controlled  Microalbuminuria: Now resolved  She has morbid obesity based on her diabetes and associated hypertension and hyperlipidemia with BMI over 35  PLAN:   She needs to start exercise, she can use the stairstepper at home  Cut back on snacks late at night She can take NovoLog for evening snacks, likely up to 30 units to control her hypoglycemia At the same  time she can reduce her Toujeo by 6 to 10 units to reduce overnight low sugars She will continue adjusting her U-500 insulin for lunch and dinner based on amount of carbohydrate and meal size  For lipids she will take all her medications in the mornings when she can remember better and have fasting labs again on the next visit  Continue same medications for blood pressure   Follow-up in 2 months     Patient Instructions  Exercise on Stepper every pm  Take Lipid Meds in am  Take 10-30 Units Novolog for 8-9 pm snacks,   OK to cut Toujeo 6-10 units if low in am           Elayne Snare 08/25/2020, 10:25 AM   Note: This office note was prepared with Dragon voice recognition system technology. Any transcriptional errors that result from this process are unintentional.

## 2020-08-30 ENCOUNTER — Encounter: Payer: Self-pay | Admitting: Gastroenterology

## 2020-08-31 ENCOUNTER — Other Ambulatory Visit: Payer: Self-pay | Admitting: Endocrinology

## 2020-08-31 ENCOUNTER — Other Ambulatory Visit: Payer: Self-pay | Admitting: Advanced Practice Midwife

## 2020-09-28 ENCOUNTER — Other Ambulatory Visit: Payer: Self-pay | Admitting: Endocrinology

## 2020-09-28 ENCOUNTER — Other Ambulatory Visit: Payer: Self-pay | Admitting: Advanced Practice Midwife

## 2020-09-28 DIAGNOSIS — Z794 Long term (current) use of insulin: Secondary | ICD-10-CM

## 2020-09-28 DIAGNOSIS — E1165 Type 2 diabetes mellitus with hyperglycemia: Secondary | ICD-10-CM

## 2020-11-01 ENCOUNTER — Other Ambulatory Visit: Payer: Self-pay | Admitting: Endocrinology

## 2020-11-01 DIAGNOSIS — E1165 Type 2 diabetes mellitus with hyperglycemia: Secondary | ICD-10-CM

## 2020-11-01 DIAGNOSIS — Z794 Long term (current) use of insulin: Secondary | ICD-10-CM

## 2020-11-08 ENCOUNTER — Encounter: Payer: BC Managed Care – PPO | Admitting: Gastroenterology

## 2020-11-22 ENCOUNTER — Other Ambulatory Visit: Payer: Self-pay

## 2020-11-22 ENCOUNTER — Other Ambulatory Visit (INDEPENDENT_AMBULATORY_CARE_PROVIDER_SITE_OTHER): Payer: BC Managed Care – PPO

## 2020-11-22 DIAGNOSIS — Z794 Long term (current) use of insulin: Secondary | ICD-10-CM

## 2020-11-22 DIAGNOSIS — E1165 Type 2 diabetes mellitus with hyperglycemia: Secondary | ICD-10-CM | POA: Diagnosis not present

## 2020-11-22 DIAGNOSIS — E782 Mixed hyperlipidemia: Secondary | ICD-10-CM | POA: Diagnosis not present

## 2020-11-22 LAB — COMPREHENSIVE METABOLIC PANEL
ALT: 40 U/L — ABNORMAL HIGH (ref 0–35)
AST: 36 U/L (ref 0–37)
Albumin: 4.2 g/dL (ref 3.5–5.2)
Alkaline Phosphatase: 78 U/L (ref 39–117)
BUN: 15 mg/dL (ref 6–23)
CO2: 25 mEq/L (ref 19–32)
Calcium: 9.3 mg/dL (ref 8.4–10.5)
Chloride: 104 mEq/L (ref 96–112)
Creatinine, Ser: 0.64 mg/dL (ref 0.40–1.20)
GFR: 102.14 mL/min (ref >60.00)
Glucose, Bld: 234 mg/dL — ABNORMAL HIGH (ref 70–99)
Potassium: 3.9 mEq/L (ref 3.5–5.1)
Sodium: 138 mEq/L (ref 135–145)
Total Bilirubin: 0.5 mg/dL (ref 0.2–1.2)
Total Protein: 7.4 g/dL (ref 6.0–8.3)

## 2020-11-22 LAB — LIPID PANEL
Cholesterol: 211 mg/dL — ABNORMAL HIGH (ref 0–200)
HDL: 31.8 mg/dL — ABNORMAL LOW (ref >39.00)
NonHDL: 178.98
Total CHOL/HDL Ratio: 7
Triglycerides: 398 mg/dL — ABNORMAL HIGH (ref 0.0–149.0)
VLDL: 79.6 mg/dL — ABNORMAL HIGH (ref 0.0–40.0)

## 2020-11-22 LAB — LDL CHOLESTEROL, DIRECT: Direct LDL: 137 mg/dL

## 2020-11-22 LAB — HEMOGLOBIN A1C: Hgb A1c MFr Bld: 6.8 % — ABNORMAL HIGH (ref 4.6–6.5)

## 2020-11-24 ENCOUNTER — Ambulatory Visit (AMBULATORY_SURGERY_CENTER): Payer: BC Managed Care – PPO

## 2020-11-24 ENCOUNTER — Encounter: Payer: Self-pay | Admitting: Endocrinology

## 2020-11-24 ENCOUNTER — Other Ambulatory Visit: Payer: Self-pay

## 2020-11-24 ENCOUNTER — Ambulatory Visit: Payer: BC Managed Care – PPO | Admitting: Endocrinology

## 2020-11-24 ENCOUNTER — Encounter: Payer: Self-pay | Admitting: Gastroenterology

## 2020-11-24 VITALS — Ht 59.0 in | Wt 180.0 lb

## 2020-11-24 VITALS — BP 124/90 | HR 92 | Ht 59.0 in | Wt 188.8 lb

## 2020-11-24 DIAGNOSIS — Z794 Long term (current) use of insulin: Secondary | ICD-10-CM

## 2020-11-24 DIAGNOSIS — E782 Mixed hyperlipidemia: Secondary | ICD-10-CM

## 2020-11-24 DIAGNOSIS — Z1211 Encounter for screening for malignant neoplasm of colon: Secondary | ICD-10-CM

## 2020-11-24 DIAGNOSIS — I10 Essential (primary) hypertension: Secondary | ICD-10-CM | POA: Diagnosis not present

## 2020-11-24 DIAGNOSIS — E1165 Type 2 diabetes mellitus with hyperglycemia: Secondary | ICD-10-CM

## 2020-11-24 DIAGNOSIS — Z23 Encounter for immunization: Secondary | ICD-10-CM | POA: Diagnosis not present

## 2020-11-24 MED ORDER — PLENVU 140 G PO SOLR: 1.0000 | Freq: Once | ORAL | 0 refills | Status: AC

## 2020-11-24 NOTE — Patient Instructions (Addendum)
No more than 100 units U 500 at lunch   Upto 140 humulin at supper for more carbs   Take lipid meds at am time

## 2020-11-24 NOTE — Progress Notes (Signed)
No egg or soy allergy known to patient  No issues known to pt with past sedation with any surgeries or procedures Patient denies ever being told they had issues or difficulty with intubation  No FH of Malignant Hyperthermia Pt is not on diet pills Pt is not on  home 02  Pt is not on blood thinners  Pt denies issues with constipation  No A fib or A flutter  Pt is fully vaccinated  for Covid   Plenvu  Coupon given to pt in PV today , Code to Pharmacy and  NO PA's for preps discussed with pt In PV today  Discussed with pt there will be an out-of-pocket cost for prep and that varies from $0 to 70 +  dollars - pt verbalized understanding   Due to the COVID-19 pandemic we are asking patients to follow certain guidelines in PV and the [place]   Pt aware of COVID protocols and [facility] guidelines

## 2020-11-24 NOTE — Progress Notes (Signed)
Patient ID: Amy Clarke, female   DOB: 1970-11-03, 50 y.o.   MRN: [id number]           Reason for Appointment:  Follow-up for Type 2 Diabetes  Referring physician: Mayra Neer   History of Present Illness:          Date of diagnosis of type 2 diabetes mellitus: 2003?         Background history:   She had gestational diabetes in 1992 and subsequently was on diet alone She thinks she was started on diabetes medication about 15 years ago and probably took metformin which she took until about a year ago and this was stopped because of diarrhea.  Records show that she was taking 1000 mg of regular metformin twice a day With her PCP she has been on numerous diabetes medications over the years including Byetta, Trulicity, Jardiance and Januvia which apparently all caused side effects, mostly nausea or yeast infections with Vania Rea and Farxiga Her A1c has been mostly higher this year, last year has been as low as 7.2, in January her A1c was 8.4  Recent history:   INSULIN regimen is:  Toujeo 60 units in the morning.  Humulin R U-500, 120 units , 2 or 3 times daily before meals   Non-insulin hypoglycemic drugs the patient is taking are: None   Current management, blood sugar patterns and problems identified:  Her A1c is 6.8 compared to 7.2, slightly better  The following patterns are seen on her Dexcom, interpretation as below  Blood sugar time in range has improved since last visit and now 64 compared to 55  Generally blood sugars are fairly well controlled overnight and mornings but usually are tending to be low normal or low around 6 PM but rising progressively until 9-10 PM  Overnight blood sugars gradually improved after about 2 AM and usually averaging in the 120-140 range until morning; hypoglycemia be occasionally seen between 4-7 AM  POSTPRANDIAL readings are on an average well-controlled after breakfast and lunch with a couple of spikes after lunch  Postprandial readings  after dinner are variable with significantly high readings late evening week 1 but only some rise in blood sugar late evening 1 week #2  HYPOGLYCEMIA is occurring mostly at 6 PM or so at least 2 or 3 times each week at least twice overnight in number 1 week   Overall diabetes management:  Her A1c is better Blood sugars are generally better controlled except for late evening and some of this may be related to her being on vacation and not eating right However still has problems with sometimes getting excessively hungry in the evenings meal or at dinnertime or later Not able to cover her evening meals adequately even though she has been told to take extra Humalog for snacks She has however cut back on her Toujeo 20 units and has generally less hypoglycemia especially in the last week She does not understand the need to avoid stacking of insulin and she will always take 120 units of the Humalog at breakfast and also usually at lunch also which is within 3 to 4 hours usually She is mostly taking 120 units for most meals and her coffee in the morning  She is walking recently but her weight is the same       Side effects from medications have been: Diarhea with regular metformin, frequent candidiasis from Jardiance, nausea from [place] are 10 AM, 2 PM and 6 PM, may skip  breakfast  CGM use % of time 93  2-week average/GV 147/56  Time in range    64    %  % Time Above 180 26  % Time above 250 4  % Time Below 70 4   Previous:  CGM use % of time 88  2-week average/GV 171+/-53  Time in range       55% was 52  % Time Above 180 43  % Time above 250 5  % Time Below 70 1.9     Self-care: The diet that the patient has been following is: tries to limit high-fat foods and drinks with sugar .     Typical meal intake: Breakfast is sometimes cereal/bagel/granola Bar.  Usually eating breakfast only if she is working  [employer] usually yogurt, cheese, fruit and crackers.  Dinner chicken with  vegetables.  She will have snacks with granola bar or popcorn                Dietician visit, most recent: Several years ago in class                Weight history:  Wt Readings from Last 3 Encounters:  11/24/20 188 lb 12.8 oz (85.6 kg)  11/24/20 180 lb (81.6 kg)  08/24/20 188 lb (85.3 kg)    Glycemic control:   Lab Results  Component Value Date   HGBA1C 6.8 (H) 11/22/2020   HGBA1C 7.2 (H) 08/08/2020   HGBA1C 7.4 (A) 03/02/2020   Lab Results  Component Value Date   MICROALBUR 10.7 (H) 08/08/2020   LDLCALC 71 03/25/2019   CREATININE 0.64 11/22/2020   Lab Results  Component Value Date   MICRALBCREAT 6.6 08/08/2020    Lab Results  Component Value Date   FRUCTOSAMINE 278 10/31/2018   FRUCTOSAMINE 265 07/18/2018   FRUCTOSAMINE 372 (H) 09/10/2017      Allergies as of 11/24/2020       Reactions   Farxiga [dapagliflozin] Other (See Comments)   YEAST INFECTION   Januvia [sitagliptin] Diarrhea, Nausea And Vomiting   Lisinopril Cough   Tanzeum [albiglutide] Nausea And Vomiting   Trulicity [dulaglutide] Nausea Only   Adhesive [tape] Rash   Sulfonamide Derivatives Rash        Medication List        Accurate as of November 24, 2020  1:47 PM. If you have any questions, ask your nurse or doctor.          Albuterol Sulfate 108 (90 Base) MCG/ACT Aepb Commonly known as: PROAIR RESPICLICK Inhale 2 puffs into the lungs as needed (shortness of breath).   atorvastatin 80 MG tablet Commonly known as: LIPITOR 1 tablet in the evening   budesonide-formoterol 160-4.5 MCG/ACT inhaler Commonly known as: SYMBICORT Inhale 2 puffs into the lungs 2 (two) times daily as needed (asthma).   cyanocobalamin 1000 MCG/ML injection Commonly known as: (VITAMIN B-12) SMARTSIG:Milliliter(s) Injection Once a Month   Dexcom G6 Receiver Devi Use to check blood sugars.   Dexcom G6 Sensor Misc USE TO CHECK BLOOD SUGAR   Dexcom G6 Transmitter Misc USE TO CHECK BLOOD SUGAR    ezetimibe 10 MG tablet Commonly known as: ZETIA TAKE (1) TABLET BY MOUTH ONCE DAILY.   fenofibrate 145 MG tablet Commonly known as: TRICOR TAKE (1) TABLET BY MOUTH ONCE DAILY.   glucose blood test strip use 1 strip as directed   glucose blood test strip Commonly known as: ONE TOUCH ULTRA TEST Use as instructed to test 3 times daily  HumuLIN R U-500 KwikPen 500 UNIT/ML KwikPen Generic drug: insulin regular human CONCENTRATED INJECT 80-110 UNITS BEFORE MEALS AND AT BEDTIME. ADJUST AS DIRECTED   Insulin Pen Needle 31G X 5 MM Misc Use for insulin and Victoza   Iron-Vitamin C 100-250 MG Tabs   Magnesium 250 MG Tabs Take 250 mg by mouth daily.   metoprolol tartrate 50 MG tablet Commonly known as: LOPRESSOR TAKE (1) TABLET BY MOUTH TWICE DAILY.   NovoLOG FlexPen 100 UNIT/ML FlexPen Generic drug: insulin aspart INJECT 30 TO 50 UNITS BEFORE DINNERTIME FOR HIGH CARBOHYDRATE MEALS.   omeprazole 20 MG capsule Commonly known as: PRILOSEC Take 20 mg by mouth 2 (two) times daily before a meal. What changed: Another medication with the same name was removed. Continue taking this medication, and follow the directions you see here. Changed by: Etheleen Nicks, RN   Plenvu 140 g Solr Generic drug: PEG-KCl-NaCl-NaSulf-Na Asc-C Take 1 Dose by mouth once for 1 dose. May use generic Started by: Etheleen Nicks, RN   rosuvastatin 40 MG tablet Commonly known as: Crestor Take 1 tablet (40 mg total) by mouth daily.   Toujeo Max SoloStar 300 UNIT/ML Solostar Pen Generic drug: insulin glargine (2 Unit Dial) Inject 110 Units into the skin daily. What changed: how much to take   Toujeo SoloStar 300 UNIT/ML Solostar Pen Generic drug: insulin glargine (1 Unit Dial) INJECT 120 UNITS INTO THE SKIN DAILY What changed: Another medication with the same name was changed. Make sure you understand how and when to take each.   valsartan 320 MG tablet Commonly known as: DIOVAN Take 320 mg by mouth  daily.   venlafaxine XR 150 MG 24 hr capsule Commonly known as: EFFEXOR-XR Take 150 mg by mouth daily with breakfast.   vitamin C 500 MG tablet Commonly known as: ASCORBIC ACID Take 500 mg by mouth daily.   Vitamin D (Cholecalciferol) 25 MCG (1000 UT) Caps Take 1,000 Units by mouth daily.        Allergies:  Allergies  Allergen Reactions   Farxiga [Dapagliflozin] Other (See Comments)    YEAST INFECTION    Januvia [Sitagliptin] Diarrhea and Nausea And Vomiting   Lisinopril Cough   Tanzeum [Albiglutide] Nausea And Vomiting   Trulicity [Dulaglutide] Nausea Only   Adhesive [Tape] Rash   Sulfonamide Derivatives Rash    Past Medical History:  Diagnosis Date   Allergy    seasonal and environmental   Anemia 2011   before hysterectemy   Asthma    Complication of anesthesia    hard time waking up   Depression    Diabetes [facility])    Dysrhythmia    father died at age 69 due to MI, heart cath in 10/2018, no intervention   GERD (gastroesophageal reflux disease)    History of kidney stones    Hyperlipidemia    Hypertension    Kidney cysts    Kidney stones    Migraine headache    PONV (postoperative nausea and vomiting)     Past Surgical History:  Procedure Laterality Date   ABDOMINAL HYSTERECTOMY     CARDIAC CATHETERIZATION     CESAREAN SECTION     x2   CHOLECYSTECTOMY     LEFT HEART CATH AND CORONARY ANGIOGRAPHY N/A 10/29/2017   Procedure: LEFT HEART CATH AND CORONARY ANGIOGRAPHY;  Surgeon: Martinique, Peter M, MD;  Location: [place] [facility];  Service: Cardiovascular;  Laterality: N/A;   LITHOTRIPSY     x3   NASAL SEPTOPLASTY  W/ TURBINOPLASTY Bilateral 11/04/2019   Procedure: NASAL SEPTOPLASTY WITH TURBINATE REDUCTION;  Surgeon: Jerrell Belfast, MD;  Location: [place];  Service: ENT;  Laterality: Bilateral;   none     TONSILLECTOMY      Family History  Problem Relation Age of Onset   Diabetes Mother    Diabetes Maternal Grandmother    Thyroid  disease Cousin    Colon cancer Neg Hx    Colon polyps Neg Hx    Esophageal cancer Neg Hx    Rectal cancer Neg Hx    Stomach cancer Neg Hx     Social History:  reports that she has never smoked. She has never used smokeless tobacco. She reports current alcohol use. She reports that she does not use drugs.   Review of Systems   HYPERLIPIDEMIA:  She has mixed hyperlipidemia She has been on Crestor  Also taking Zetia and fenofibrate However she forgets to take her medications and lately has not been taking Crestor regularly also with higher LDL  Triglycerides are consistently high and now nearly 400    Lab Results  Component Value Date   CHOL 211 (H) 11/22/2020   CHOL 153 08/08/2020   CHOL 220 (H) 10/07/2019   Lab Results  Component Value Date   HDL 31.80 (L) 11/22/2020   HDL 29.80 (L) 08/08/2020   HDL 30.60 (L) 10/07/2019   Lab Results  Component Value Date   LDLCALC 71 03/25/2019   Lab Results  Component Value Date   TRIG 398.0 (H) 11/22/2020   TRIG 348.0 (H) 08/08/2020   TRIG (H) 10/07/2019    477.0 Triglyceride is over 400; calculations on Lipids are invalid.   Lab Results  Component Value Date   CHOLHDL 7 11/22/2020   CHOLHDL 5 08/08/2020   CHOLHDL 7 10/07/2019   Lab Results  Component Value Date   LDLDIRECT 137.0 11/22/2020   LDLDIRECT 95.0 08/08/2020   LDLDIRECT 138.0 10/07/2019            Hypertension: Blood pressure has been high since her 49s, managed by PCP with valsartan 320 mg   BP Readings from Last 3 Encounters:  11/24/20 124/90  08/24/20 122/82  06/01/20 130/84   Microalbuminuria: Controlled as of 7/22  Most recent eye exam was In 3/22  Most recent foot exam: 03/2018    Physical Examination:  BP 124/90   Pulse 92   Ht 4\' 11"  (1.499 m)   Wt 188 lb 12.8 oz (85.6 kg)   SpO2 97%   BMI 38.13 kg/m       ASSESSMENT:  Diabetes type 2, insulin requiring  See history of present illness for detailed discussion of current  diabetes management, blood sugar patterns and problems identified  Her A1c is now 6.8 compared to 7.2  She has had insulin resistance and requiring large doses of insulin, currently on U-500 insulin along with Toujeo  Recently despite having some postprandial hyperglycemia either at lunch or late evening she still has an average blood sugar of 147 with only 64% within target range  Discussed that she is likely getting too much insulin at lunchtime and sometimes not enough at dinnertime with her Humulin R Explained to her the onset of action and duration of action of Humulin R as being up to 12 hours Low sugars before dinnertime may be from stacking insulin Also has difficulty getting high readings from snacks or larger portions at dinnertime not adequately controlled by insulin adjustment She has limited coverage  for insulin pumps at this time Normal control has been difficult  She has been intolerant to GLP-1 drugs and SGLT2 drugs  HYPERLIPIDEMIA: Triglycerides are poorly controlled and also LDL is high from not taking any of her medications including Crestor regularly  HYPERTENSION: Blood pressure is higher today, usually followed by PCP  Microalbuminuria: Now resolved  She has morbid obesity based on her diabetes and associated hypertension and hyperlipidemia with BMI over 35  PLAN:   Consistent exercise  Trial of MOUNJARO Discussed in detail the mode of action of Mounjaro, benefits with better blood sugar control and weight control as well as effect on gastric emptying and increased satiety She will try the sample of 2.5 mg weekly and demonstrated the injection device how to use and where to inject She will call if she does not have any significant nausea at the end of the third week and go up to 5 mg weekly Also can take additional NovoLog for evening carbohydrate snacks if needed If her morning sugars start getting low or overnight blood sugars are low normal she can cut back  on her Toujeo 5 to 10 units She will avoid taking more than 100 units of Humulin R at lunch if she has taken this insulin in the morning  May need higher doses of Humulin R at suppertime for higher fat meals  Follow-up in 2 months  For lipids she will take all her medications in the mornings when she can remember better and have fasting labs again on the next visit  Blood pressure to be checked regularly at home  Follow-up in 2 months  Flu vaccine given   Patient Instructions  No more than 100 units U 500 at lunch   Upto 140 humulin at supper for more carbs   Take lipid meds at am time            Elayne Snare 11/24/2020, 1:47 PM   Note: This office note was prepared with Dragon voice recognition system technology. Any transcriptional errors that result from this process are unintentional.

## 2020-11-29 ENCOUNTER — Other Ambulatory Visit: Payer: Self-pay | Admitting: Endocrinology

## 2020-11-29 DIAGNOSIS — E1165 Type 2 diabetes mellitus with hyperglycemia: Secondary | ICD-10-CM

## 2020-12-08 ENCOUNTER — Other Ambulatory Visit: Payer: Self-pay

## 2020-12-08 ENCOUNTER — Ambulatory Visit (AMBULATORY_SURGERY_CENTER): Payer: BC Managed Care – PPO | Admitting: Gastroenterology

## 2020-12-08 ENCOUNTER — Encounter: Payer: Self-pay | Admitting: Gastroenterology

## 2020-12-08 VITALS — BP 114/64 | HR 76 | Temp 97.8°F | Resp 18 | Ht 59.0 in | Wt 180.0 lb

## 2020-12-08 DIAGNOSIS — K635 Polyp of colon: Secondary | ICD-10-CM

## 2020-12-08 DIAGNOSIS — D123 Benign neoplasm of transverse colon: Secondary | ICD-10-CM

## 2020-12-08 DIAGNOSIS — Z1211 Encounter for screening for malignant neoplasm of colon: Secondary | ICD-10-CM

## 2020-12-08 MED ORDER — SODIUM CHLORIDE 0.9 % IV SOLN: 500.0000 mL | Freq: Once | INTRAVENOUS | Status: DC

## 2020-12-08 NOTE — Progress Notes (Signed)
Called to room to assist during endoscopic procedure.  Patient ID and intended procedure confirmed with present staff. Received instructions for my participation in the procedure from the performing physician.  

## 2020-12-08 NOTE — Patient Instructions (Signed)
Handout given on polyps. Await pathology results. Repeat colonoscopy in 7 years for surveillance.  Resume previous diet and continue present medications.  YOU HAD AN ENDOSCOPIC PROCEDURE TODAY AT [facility]:   Refer to the procedure report that was given to you for any specific questions about what was found during the examination.  If the procedure report does not answer your questions, please call your gastroenterologist to clarify.  If you requested that your care partner not be given the details of your procedure findings, then the procedure report has been included in a sealed envelope for you to review at your convenience later.  YOU SHOULD EXPECT: Some feelings of bloating in the abdomen. Passage of more gas than usual.  Walking can help get rid of the air that was put into your GI tract during the procedure and reduce the bloating. If you had a lower endoscopy (such as a colonoscopy or flexible sigmoidoscopy) you may notice spotting of blood in your stool or on the toilet paper. If you underwent a bowel prep for your procedure, you may not have a normal bowel movement for a few days.  Please Note:  You might notice some irritation and congestion in your nose or some drainage.  This is from the oxygen used during your procedure.  There is no need for concern and it should clear up in a day or so.  SYMPTOMS TO REPORT IMMEDIATELY:  Following lower endoscopy (colonoscopy or flexible sigmoidoscopy):  Excessive amounts of blood in the stool  Significant tenderness or worsening of abdominal pains  Swelling of the abdomen that is new, acute  Fever of 100F or higher  For urgent or emergent issues, a gastroenterologist can be reached at any hour by calling [phone]. Do not use MyChart messaging for urgent concerns.    DIET:  We do recommend a small meal at first, but then you may proceed to your regular diet.  Drink plenty of fluids but you should avoid alcoholic  beverages for 24 hours.  ACTIVITY:  You should plan to take it easy for the rest of today and you should NOT DRIVE or use heavy machinery until tomorrow (because of the sedation medicines used during the test).    FOLLOW UP: Our staff will call the number listed on your records 48-72 hours following your procedure to check on you and address any questions or concerns that you may have regarding the information given to you following your procedure. If we do not reach you, we will leave a message.  We will attempt to reach you two times.  During this call, we will ask if you have developed any symptoms of COVID 19. If you develop any symptoms (ie: fever, flu-like symptoms, shortness of breath, cough etc.) before then, please call [phone].  If you test positive for Covid 19 in the 2 weeks post procedure, please call and report this information to [place].    If any biopsies were taken you will be contacted by phone or by letter within the next 1-3 weeks.  Please call us at [phone] if you have not heard about the biopsies in 3 weeks.    SIGNATURES/CONFIDENTIALITY: You and/or your care partner have signed paperwork which will be entered into your electronic medical record.  These signatures attest to the fact that that the information above on your After Visit Summary has been reviewed and is understood.  Full responsibility of the confidentiality of this discharge information lies with you  and/or your care-partner.

## 2020-12-08 NOTE — Op Note (Signed)
[facility] Patient Name: Amy Clarke Procedure Date: 12/08/2020 1:30 PM MRN: [id number] Endoscopist: Nicki Reaper E. Candis Schatz , MD Age: 50 Referring MD:  Date of Birth: 1970/07/15 Gender: Female Account #: [id number] Procedure:                Colonoscopy Indications:              Screening for colorectal malignant neoplasm, This                            is the patient's first colonoscopy Medicines:                Monitored Anesthesia Care Procedure:                Pre-Anesthesia Assessment:                           - Prior to the procedure, a History and Physical                            was performed, and patient medications and                            allergies were reviewed. The patient's tolerance of                            previous anesthesia was also reviewed. The risks                            and benefits of the procedure and the sedation                            options and risks were discussed with the patient.                            All questions were answered, and informed consent                            was obtained. Prior Anticoagulants: The patient has                            taken no previous anticoagulant or antiplatelet                            agents. ASA Grade Assessment: II - A patient with                            mild systemic disease. After reviewing the risks                            and benefits, the patient was deemed in                            satisfactory condition to undergo the procedure.  After obtaining informed consent, the colonoscope                            was passed under direct vision. Throughout the                            procedure, the patient's blood pressure, pulse, and                            oxygen saturations were monitored continuously. The                            CF [id number] #[id number] was introduced through the anus                            and advanced to the  the terminal ileum, with                            identification of the appendiceal orifice and IC                            valve. The colonoscopy was performed without                            difficulty. The patient tolerated the procedure                            well. The quality of the bowel preparation was                            good. The terminal ileum, ileocecal valve,                            appendiceal orifice, and rectum were photographed. Scope In: 1:36:03 PM Scope Out: 1:53:28 PM Scope Withdrawal Time: 0 hours 13 minutes 37 seconds  Total Procedure Duration: 0 hours 17 minutes 25 seconds  Findings:                 The perianal and digital rectal examinations were                            normal. Pertinent negatives include normal                            sphincter tone and no palpable rectal lesions.                           Two sessile polyps were found in the splenic                            flexure and distal transverse colon. The polyps                            were 2 to 3 mm in size. These  polyps were removed                            with a cold snare. Resection and retrieval were                            complete. Estimated blood loss was minimal.                           A few small-mouthed diverticula were found in the                            sigmoid colon.                           The exam was otherwise normal throughout the                            examined colon.                           The terminal ileum appeared normal.                           The retroflexed view of the distal rectum and anal                            verge was normal and showed no anal or rectal                            abnormalities. Complications:            No immediate complications. Estimated Blood Loss:     Estimated blood loss was minimal. Impression:               - Two 2 to 3 mm polyps at the splenic flexure and                            in  the distal transverse colon, removed with a cold                            snare. Resected and retrieved.                           - Diverticulosis in the sigmoid colon.                           - The examined portion of the ileum was normal.                           - The distal rectum and anal verge are normal on                            retroflexion view. Recommendation:           - Patient has a contact number available for  emergencies. The signs and symptoms of potential                            delayed complications were discussed with the                            patient. Return to normal activities tomorrow.                            Written discharge instructions were provided to the                            patient.                           - Resume previous diet.                           - Continue present medications.                           - Await pathology results.                           - Repeat colonoscopy in 7 years for surveillance. [name] E. Candis Schatz, MD 12/08/2020 1:58:40 PM This report has been signed electronically.

## 2020-12-08 NOTE — Progress Notes (Signed)
Pt's states no medical or surgical changes since previsit or office visit. 

## 2020-12-08 NOTE — Progress Notes (Signed)
[facility] History and Physical   Primary Care Physician:  Mayra Neer, MD   Reason for Procedure:   Colon cancer screening  Plan:    Screening colonoscopy     HPI: Amy Clarke is a 50 y.o. female here for initial average risk screening colonoscopy.  She has no chronic GI symptoms and no family history of colon cancer.   Past Medical History:  Diagnosis Date   Allergy    seasonal and environmental   Anemia 2011   before hysterectemy   Asthma    Complication of anesthesia    hard time waking up   Depression    Diabetes [facility])    Dysrhythmia    father died at age 26 due to MI, heart cath in 10/2018, no intervention   GERD (gastroesophageal reflux disease)    History of kidney stones    Hyperlipidemia    Hypertension    Kidney cysts    Kidney stones    Migraine headache    PONV (postoperative nausea and vomiting)     Past Surgical History:  Procedure Laterality Date   ABDOMINAL HYSTERECTOMY     CARDIAC CATHETERIZATION     CESAREAN SECTION     x2   CHOLECYSTECTOMY     LEFT HEART CATH AND CORONARY ANGIOGRAPHY N/A 10/29/2017   Procedure: LEFT HEART CATH AND CORONARY ANGIOGRAPHY;  Surgeon: Martinique, Peter M, MD;  Location: [place] [facility];  Service: Cardiovascular;  Laterality: N/A;   LITHOTRIPSY     x3   NASAL SEPTOPLASTY W/ TURBINOPLASTY Bilateral 11/04/2019   Procedure: NASAL SEPTOPLASTY WITH TURBINATE REDUCTION;  Surgeon: Jerrell Belfast, MD;  Location: [place];  Service: ENT;  Laterality: Bilateral;   none     TONSILLECTOMY      Prior to Admission medications   Medication Sig Start Date End Date Taking? Authorizing Provider  Albuterol Sulfate 108 (90 Base) MCG/ACT AEPB Inhale 2 puffs into the lungs as needed (shortness of breath).    Yes [provider]  atorvastatin (LIPITOR) 80 MG tablet 1 tablet in the evening   Yes [provider]  budesonide-formoterol (SYMBICORT) 160-4.5 MCG/ACT inhaler Inhale 2  puffs into the lungs 2 (two) times daily as needed (asthma).    Yes [provider]  Continuous Blood Gluc Receiver (DEXCOM G6 RECEIVER) DEVI Use to check blood sugars. 10/09/19  Yes Elayne Snare, MD  Continuous Blood Gluc Sensor (DEXCOM G6 SENSOR) MISC USE TO CHECK BLOOD SUGAR 11/29/20  Yes Elayne Snare, MD  Continuous Blood Gluc Transmit (DEXCOM G6 TRANSMITTER) MISC USE TO CHECK BLOOD SUGAR 01/12/20  Yes Elayne Snare, MD  ezetimibe (ZETIA) 10 MG tablet TAKE (1) TABLET BY MOUTH ONCE DAILY. 08/31/20  Yes Elayne Snare, MD  fenofibrate (TRICOR) 145 MG tablet TAKE (1) TABLET BY MOUTH ONCE DAILY. 05/27/20  Yes Elayne Snare, MD  glucose blood (ONE TOUCH ULTRA TEST) test strip Use as instructed to test 3 times daily 09/11/17  Yes Elayne Snare, MD  glucose blood test strip use 1 strip as directed 01/25/17  Yes [provider]  HUMULIN R U-500 KWIKPEN 500 UNIT/ML KwikPen INJECT 80-110 UNITS BEFORE MEALS AND AT BEDTIME. ADJUST AS DIRECTED 11/29/20  Yes Elayne Snare, MD  Insulin Pen Needle 31G X 5 MM MISC Use for insulin and Victoza 09/03/18  Yes Elayne Snare, MD  metoprolol tartrate (LOPRESSOR) 50 MG tablet TAKE (1) TABLET BY MOUTH TWICE DAILY. 05/04/20  Yes Josue Hector, MD  NOVOLOG FLEXPEN 100 UNIT/ML  FlexPen INJECT 30 TO 50 UNITS BEFORE DINNERTIME FOR HIGH CARBOHYDRATE MEALS. 11/01/20  Yes Elayne Snare, MD  omeprazole (PRILOSEC) 20 MG capsule Take 20 mg by mouth 2 (two) times daily before a meal. 10/26/20  Yes [provider]  rosuvastatin (CRESTOR) 40 MG tablet Take 1 tablet (40 mg total) by mouth daily. 12/09/19  Yes Elayne Snare, MD  valsartan (DIOVAN) 320 MG tablet Take 320 mg by mouth daily.    Yes [provider]  venlafaxine XR (EFFEXOR-XR) 150 MG 24 hr capsule Take 150 mg by mouth daily with breakfast.   Yes [provider]  vitamin C (ASCORBIC ACID) 500 MG tablet Take 500 mg by mouth daily.   Yes [provider]  Vitamin D, Cholecalciferol, 1000 units CAPS Take  1,000 Units by mouth daily.    Yes [provider]  cyanocobalamin (,VITAMIN B-12,) 1000 MCG/ML injection SMARTSIG:Milliliter(s) Injection Once a Month 08/27/19   [provider]  insulin glargine, 2 Unit Dial, (TOUJEO MAX SOLOSTAR) 300 UNIT/ML Solostar Pen Inject 110 Units into the skin daily. Patient taking differently: Inject 80 Units into the skin daily. 10/10/19   Elayne Snare, MD  Iron-Vitamin C 100-250 MG TABS     [provider]  Magnesium 250 MG TABS Take 250 mg by mouth daily.    [provider]  tirzepatide Darcel Bayley) 2.5 MG/0.5ML Pen Inject 2.5 mg into the skin once a week.    [provider]  TOUJEO SOLOSTAR 300 UNIT/ML Solostar Pen INJECT 120 UNITS INTO THE SKIN DAILY 11/29/20   Elayne Snare, MD    Current Outpatient Medications  Medication Sig Dispense Refill   Albuterol Sulfate 108 (90 Base) MCG/ACT AEPB Inhale 2 puffs into the lungs as needed (shortness of breath).      atorvastatin (LIPITOR) 80 MG tablet 1 tablet in the evening     budesonide-formoterol (SYMBICORT) 160-4.5 MCG/ACT inhaler Inhale 2 puffs into the lungs 2 (two) times daily as needed (asthma).      Continuous Blood Gluc Receiver (DEXCOM G6 RECEIVER) DEVI Use to check blood sugars. 1 each 0   Continuous Blood Gluc Sensor (DEXCOM G6 SENSOR) MISC USE TO CHECK BLOOD SUGAR 3 each 0   Continuous Blood Gluc Transmit (DEXCOM G6 TRANSMITTER) MISC USE TO CHECK BLOOD SUGAR 1 each 3   ezetimibe (ZETIA) 10 MG tablet TAKE (1) TABLET BY MOUTH ONCE DAILY. 30 tablet 0   fenofibrate (TRICOR) 145 MG tablet TAKE (1) TABLET BY MOUTH ONCE DAILY. 90 tablet 0   glucose blood (ONE TOUCH ULTRA TEST) test strip Use as instructed to test 3 times daily 300 each 3   glucose blood test strip use 1 strip as directed     HUMULIN R U-500 KWIKPEN 500 UNIT/ML KwikPen INJECT 80-110 UNITS BEFORE MEALS AND AT BEDTIME. ADJUST AS DIRECTED 18 mL 0   Insulin Pen Needle 31G X 5 MM MISC Use for insulin and Victoza  200 each 1   metoprolol tartrate (LOPRESSOR) 50 MG tablet TAKE (1) TABLET BY MOUTH TWICE DAILY. 180 tablet 3   NOVOLOG FLEXPEN 100 UNIT/ML FlexPen INJECT 30 TO 50 UNITS BEFORE DINNERTIME FOR HIGH CARBOHYDRATE MEALS. 15 mL 0   omeprazole (PRILOSEC) 20 MG capsule Take 20 mg by mouth 2 (two) times daily before a meal.     rosuvastatin (CRESTOR) 40 MG tablet Take 1 tablet (40 mg total) by mouth daily. 90 tablet 1   valsartan (DIOVAN) 320 MG tablet Take 320 mg by mouth daily.  venlafaxine XR (EFFEXOR-XR) 150 MG 24 hr capsule Take 150 mg by mouth daily with breakfast.     vitamin C (ASCORBIC ACID) 500 MG tablet Take 500 mg by mouth daily.     Vitamin D, Cholecalciferol, 1000 units CAPS Take 1,000 Units by mouth daily.      cyanocobalamin (,VITAMIN B-12,) 1000 MCG/ML injection SMARTSIG:Milliliter(s) Injection Once a Month     insulin glargine, 2 Unit Dial, (TOUJEO MAX SOLOSTAR) 300 UNIT/ML Solostar Pen Inject 110 Units into the skin daily. (Patient taking differently: Inject 80 Units into the skin daily.) 18 mL 1   Iron-Vitamin C 100-250 MG TABS      Magnesium 250 MG TABS Take 250 mg by mouth daily.     tirzepatide [facility]) 2.5 MG/0.5ML Pen Inject 2.5 mg into the skin once a week.     TOUJEO SOLOSTAR 300 UNIT/ML Solostar Pen INJECT 120 UNITS INTO THE SKIN DAILY 9 mL 0   Current Facility-Administered Medications  Medication Dose Route Frequency Provider Last Rate Last Admin   0.9 %  sodium chloride infusion  500 mL Intravenous Once Daryel November, MD        Allergies as of 12/08/2020 - Review Complete 12/08/2020  Allergen Reaction Noted   Farxiga [dapagliflozin] Other (See Comments) 06/22/2015   Januvia [sitagliptin] Diarrhea and Nausea And Vomiting 06/22/2015   Lisinopril Cough 06/22/2015   Tanzeum [albiglutide] Nausea And Vomiting 49/70/2637   Trulicity [dulaglutide] Nausea Only 06/22/2015   Adhesive [tape] Rash 11/04/2019   Sulfonamide derivatives Rash     Family History   Problem Relation Age of Onset   Diabetes Mother    Diabetes Maternal Grandmother    Thyroid disease Cousin    Colon cancer Neg Hx    Colon polyps Neg Hx    Esophageal cancer Neg Hx    Rectal cancer Neg Hx    Stomach cancer Neg Hx     Social History   Socioeconomic History   Marital status: Married    Spouse name: Not on file   Number of children: 4   Years of education: Not on file   Highest education level: Not on file  Occupational History   Occupation: unemployed  Tobacco Use   Smoking status: Never   Smokeless tobacco: Never  Vaping Use   Vaping Use: Never used  Substance and Sexual Activity   Alcohol use: Yes    Comment: social, less than weekly   Drug use: Never   Sexual activity: Not on file  Other Topics Concern   Not on file  Social History Narrative   Not on file   Social Determinants of Health   Financial Resource Strain: Not on file  Food Insecurity: Not on file  Transportation Needs: Not on file  Physical Activity: Not on file  Stress: Not on file  Social Connections: Not on file  Intimate Partner Violence: Not on file    Review of Systems: All other review of systems negative except as mentioned in the HPI.  Physical Exam: Vital signs BP 111/71   Pulse 93   Temp 97.8 F (36.6 C) (Temporal)   Ht 4\' 11"  (1.499 m)   Wt 180 lb (81.6 kg)   SpO2 98%   BMI 36.36 kg/m   General:   Alert,  Well-developed, well-nourished, pleasant and cooperative in NAD Airway:  Mallampati 1 Lungs:  Clear throughout to auscultation.   Heart:  Regular rate and rhythm; no murmurs, clicks, rubs,  or gallops. Abdomen:  Soft, nontender  and nondistended. Normal bowel sounds.   Neuro/Psych:  Normal mood and affect. A and O x 3   Tamyia Minich E. Candis Schatz, MD [facility]

## 2020-12-12 ENCOUNTER — Telehealth: Payer: Self-pay

## 2020-12-12 NOTE — Telephone Encounter (Signed)
Left message on follow up call. 

## 2020-12-15 NOTE — Progress Notes (Signed)
Amy Clarke,  The two polyps which I removed during your recent procedure were proven to be completely benign but are considered "pre-cancerous" polyps that MAY have grown into cancer if they had not been removed.  Studies shows that at least 20% of women over age 51 and 30% of men over age 14 have pre-cancerous polyps.  Based on current nationally recognized surveillance guidelines, I recommend that you have a repeat colonoscopy in 7 years.   If you develop any new rectal bleeding, abdominal pain or significant bowel habit changes, please contact me before then.

## 2021-01-10 ENCOUNTER — Other Ambulatory Visit: Payer: Self-pay | Admitting: Endocrinology

## 2021-01-10 DIAGNOSIS — E1165 Type 2 diabetes mellitus with hyperglycemia: Secondary | ICD-10-CM

## 2021-01-10 DIAGNOSIS — Z794 Long term (current) use of insulin: Secondary | ICD-10-CM

## 2021-01-18 ENCOUNTER — Telehealth: Payer: Self-pay | Admitting: Endocrinology

## 2021-01-18 DIAGNOSIS — Z794 Long term (current) use of insulin: Secondary | ICD-10-CM

## 2021-01-18 MED ORDER — MOUNJARO 2.5 MG/0.5ML ~~LOC~~ SOAJ: 2.5000 mg | SUBCUTANEOUS | 2 refills | Status: DC

## 2021-01-18 NOTE — Telephone Encounter (Signed)
MEDICATION: tirzepatide [facility]) 2.5 MG/0.5ML Pen  PHARMACY:  [place],  - [address] Phone:  [phone]  Fax:  [phone]       HAS THE PATIENT CONTACTED THEIR PHARMACY?  no  IS THIS A 90 DAY SUPPLY : 30 day supply  IS PATIENT OUT OF MEDICATION: yes  IF NOT; HOW MUCH IS LEFT:   LAST APPOINTMENT DATE: @12 /13/2022  NEXT APPOINTMENT DATE:@1 /13/2023  DO WE HAVE YOUR PERMISSION TO LEAVE A DETAILED MESSAGE?:  OTHER COMMENTS: PT only had samples of medication and now needs refill.   **Let patient know to contact pharmacy at the end of the day to make sure medication is ready. **  ** Please notify patient to allow 48-72 hours to process**  **Encourage patient to contact the pharmacy for refills or they can request refills through [facility]**

## 2021-01-18 NOTE — Telephone Encounter (Signed)
Rx sent to pharmacy   

## 2021-01-19 ENCOUNTER — Other Ambulatory Visit: Payer: BC Managed Care – PPO

## 2021-01-19 ENCOUNTER — Other Ambulatory Visit (HOSPITAL_COMMUNITY): Payer: Self-pay

## 2021-01-19 ENCOUNTER — Telehealth: Payer: Self-pay

## 2021-01-19 NOTE — Telephone Encounter (Signed)
Patient Advocate Encounter   Received notification from [facility] that prior authorization for [facility] pen injector is required by his/her insurance [employer].   PA submitted on 01/18/21  Key#: [id number]  Status is pending    [facility] will continue to follow:  Patient Advocate Fax:  [phone]

## 2021-01-24 ENCOUNTER — Other Ambulatory Visit: Payer: BC Managed Care – PPO

## 2021-01-24 ENCOUNTER — Other Ambulatory Visit: Payer: Self-pay | Admitting: Endocrinology

## 2021-01-24 MED ORDER — TIRZEPATIDE 5 MG/0.5ML ~~LOC~~ SOAJ: 5.0000 mg | SUBCUTANEOUS | 1 refills | Status: DC

## 2021-01-24 NOTE — Telephone Encounter (Signed)
Patient Advocate Encounter  Received notification from [employer] that the request for prior authorization for [facility] 2.5mg  pen injector has been denied due to the patient not trying the preferred alt., such as Ryblesus or Ozempic.   This encounter will continue to be updated until final determination.     Specialty Pharmacy Patient Advocate Fax:  [phone]

## 2021-01-27 ENCOUNTER — Ambulatory Visit: Payer: BC Managed Care – PPO | Admitting: Endocrinology

## 2021-02-02 ENCOUNTER — Other Ambulatory Visit: Payer: Self-pay | Admitting: Endocrinology

## 2021-02-06 ENCOUNTER — Other Ambulatory Visit: Payer: Self-pay | Admitting: Endocrinology

## 2021-02-06 DIAGNOSIS — E1165 Type 2 diabetes mellitus with hyperglycemia: Secondary | ICD-10-CM

## 2021-02-07 ENCOUNTER — Telehealth: Payer: Self-pay | Admitting: Endocrinology

## 2021-02-07 NOTE — Telephone Encounter (Signed)
Patient called stating that approval is needed by insurance company for patient to get prescription for mounjaro. Please forward approval to pharmacy:  [facility], [place] Phone:  [phone]  Fax:  [phone]    Patient ask for confirmation once complete [id number].

## 2021-02-10 ENCOUNTER — Other Ambulatory Visit: Payer: BC Managed Care – PPO

## 2021-02-10 NOTE — Telephone Encounter (Signed)
Called patient she will get copay card and try Larene Pickett if not she will notify us ad we will send to another pharmacy

## 2021-02-10 NOTE — Telephone Encounter (Signed)
Tried calling patient again still no answer left voicemail. PA has already been done and was DENIED. Per previous message from Dr Dwyane Dee patient has to use copay card online to get medication. If [facility] cannot get medication then we will have to send Rx to another pharmacy.

## 2021-02-13 NOTE — Telephone Encounter (Signed)
Patient called re: Patient cannot afford Mounjaro (cost after discount card to Patient is $525.00).   Patient requests to be called at ph# [phone] to discuss the above.

## 2021-02-14 ENCOUNTER — Ambulatory Visit: Payer: BC Managed Care – PPO | Admitting: Endocrinology

## 2021-02-14 ENCOUNTER — Other Ambulatory Visit: Payer: Self-pay | Admitting: Endocrinology

## 2021-02-14 NOTE — Telephone Encounter (Signed)
I called to notify patient

## 2021-03-03 ENCOUNTER — Other Ambulatory Visit: Payer: Self-pay | Admitting: Endocrinology

## 2021-03-03 DIAGNOSIS — E1165 Type 2 diabetes mellitus with hyperglycemia: Secondary | ICD-10-CM

## 2021-03-09 ENCOUNTER — Other Ambulatory Visit: Payer: Self-pay | Admitting: Endocrinology

## 2021-03-09 DIAGNOSIS — E1165 Type 2 diabetes mellitus with hyperglycemia: Secondary | ICD-10-CM

## 2021-03-09 DIAGNOSIS — Z794 Long term (current) use of insulin: Secondary | ICD-10-CM

## 2021-03-15 ENCOUNTER — Other Ambulatory Visit: Payer: Self-pay | Admitting: Endocrinology

## 2021-03-27 ENCOUNTER — Other Ambulatory Visit (INDEPENDENT_AMBULATORY_CARE_PROVIDER_SITE_OTHER): Payer: BC Managed Care – PPO

## 2021-03-27 ENCOUNTER — Other Ambulatory Visit: Payer: Self-pay

## 2021-03-27 DIAGNOSIS — E782 Mixed hyperlipidemia: Secondary | ICD-10-CM

## 2021-03-27 DIAGNOSIS — Z794 Long term (current) use of insulin: Secondary | ICD-10-CM | POA: Diagnosis not present

## 2021-03-27 DIAGNOSIS — E1165 Type 2 diabetes mellitus with hyperglycemia: Secondary | ICD-10-CM

## 2021-03-27 LAB — LIPID PANEL
Cholesterol: 115 mg/dL (ref 0–200)
HDL: 32.9 mg/dL — ABNORMAL LOW (ref >39.00)
LDL Cholesterol: 44 mg/dL (ref 0–99)
NonHDL: 81.71
Total CHOL/HDL Ratio: 3
Triglycerides: 189 mg/dL — ABNORMAL HIGH (ref 0.0–149.0)
VLDL: 37.8 mg/dL (ref 0.0–40.0)

## 2021-03-27 LAB — GLUCOSE, RANDOM: Glucose, Bld: 93 mg/dL (ref 70–99)

## 2021-03-28 ENCOUNTER — Other Ambulatory Visit: Payer: BC Managed Care – PPO

## 2021-03-28 LAB — FRUCTOSAMINE: Fructosamine: 250 umol/L (ref 0–285)

## 2021-03-30 ENCOUNTER — Other Ambulatory Visit: Payer: Self-pay

## 2021-03-30 ENCOUNTER — Ambulatory Visit: Payer: BC Managed Care – PPO | Admitting: Endocrinology

## 2021-03-30 ENCOUNTER — Encounter: Payer: Self-pay | Admitting: Endocrinology

## 2021-03-30 VITALS — BP 112/80 | HR 95 | Ht 59.0 in | Wt 188.0 lb

## 2021-03-30 DIAGNOSIS — E782 Mixed hyperlipidemia: Secondary | ICD-10-CM | POA: Diagnosis not present

## 2021-03-30 DIAGNOSIS — Z794 Long term (current) use of insulin: Secondary | ICD-10-CM

## 2021-03-30 DIAGNOSIS — E1165 Type 2 diabetes mellitus with hyperglycemia: Secondary | ICD-10-CM | POA: Diagnosis not present

## 2021-03-30 LAB — POCT GLYCOSYLATED HEMOGLOBIN (HGB A1C): Hemoglobin A1C: 7 % — AB (ref 4.0–5.6)

## 2021-03-30 MED ORDER — ROSUVASTATIN CALCIUM 20 MG PO TABS: 20.0000 mg | ORAL_TABLET | Freq: Every day | ORAL | 3 refills | Status: AC

## 2021-03-30 NOTE — Patient Instructions (Addendum)
Toujeo 30 units ? ?Supper take 140 Humulin R before supper ? ? ?

## 2021-03-30 NOTE — Progress Notes (Signed)
Patient ID: Amy Clarke, female   DOB: 02/20/1970, 51 y.o.   MRN: [id number]           Reason for Appointment:  Follow-up for Type 2 Diabetes  Referring physician: Mayra Neer   History of Present Illness:          Date of diagnosis of type 2 diabetes mellitus: 2003?         Background history:   She had gestational diabetes in 1992 and subsequently was on diet alone She thinks she was started on diabetes medication about 15 years ago and probably took metformin which she took until about a year ago and this was stopped because of diarrhea.  Records show that she was taking 1000 mg of regular metformin twice a day With her PCP she has been on numerous diabetes medications over the years including Byetta, Trulicity, Jardiance and Januvia which apparently all caused side effects, mostly nausea or yeast infections with Vania Rea and Farxiga Her A1c has been mostly higher this year, last year has been as low as 7.2, in January her A1c was 8.4  Recent history:   INSULIN regimen is:  Toujeo 60 units in the morning.  Humulin R U-500, 120 units , 2 or 3 times daily before meals   Non-insulin hypoglycemic drugs the patient is taking are: None   Current management, blood sugar patterns and problems identified:  Her A1c is slightly higher at 7.0  The following patterns are seen on her Dexcom, interpretation as below  Blood sugar time in range has worsened since last visit and now 49% compared to 64  Her blood sugars are showing significant variability  HYPERGLYCEMIC episodes are occurring fairly frequently in the early afternoon and more consistently late evening after at least 9 PM  She does not have any significant hypoglycemia but will have transiently low blood sugars between 5-7 AM periodically  POSTPRANDIAL readings are mostly higher after her midday meal but are variable after the evening meal However blood sugars appear to be consistently high between about 9 PM-1 AM   Overnight blood sugars average about 200 at midnight and then on an average gradually decrease to the lowest reading of 119 around 4 AM     Current diabetes management and problems identified:  Her A1c is slightly higher She appears to be taking less Toujeo since her last visit as her blood sugars tend to be low normal or low early morning  She was given a trial of MOUNJARO after her last visit but she had significant nausea even with 2.5 mg and was not able to eat much  Also has had same side effects with other GLP-1 drugs  She takes insulin in the morning even though she is not eating much at breakfast time She is usually taking 120 units for most meals and her coffee in the morning and not adjusting this very much However she will not always take a dose at midday if she is only eating a small snack Not clear why her blood sugars are significantly higher late at night even though blood sugars after her evening meal go up only occasionally She may be getting some snacks later at night causing higher sugars Again has difficulty losing weight       Side effects from medications have been: Diarhea with regular metformin, frequent candidiasis from Jardiance, nausea from [place] are 10 AM, 2 PM and 6 PM, may skip breakfast  CGM use % of time 79  2-week average/GV 179+/-69  Time in range 49     %  % Time Above 180 33  % Time above 250 15  % Time Below 70 2     PRE-MEAL Fasting Lunch Dinner Bedtime Overall  Glucose range:       Averages: 137 157 186 224    Previously:   CGM use % of time 93  2-week average/GV 147/56  Time in range    64    %  % Time Above 180 26  % Time above 250 4  % Time Below 70 4   Previous:  CGM use % of time 88  2-week average/GV 171+/-53  Time in range       55% was 52  % Time Above 180 43  % Time above 250 5  % Time Below 70 1.9     Self-care: The diet that the patient has been following is: tries to limit high-fat foods and drinks  with sugar .     Typical meal intake: Breakfast is sometimes cereal/bagel/granola Bar.  Usually eating breakfast only if she is working  [employer] usually yogurt, cheese, fruit and crackers.  Dinner chicken with vegetables.  She will have snacks with granola bar or popcorn                Dietician visit, most recent: Several years ago in class                Weight history:  Wt Readings from Last 3 Encounters:  03/30/21 188 lb (85.3 kg)  12/08/20 180 lb (81.6 kg)  11/24/20 188 lb 12.8 oz (85.6 kg)    Glycemic control:   Lab Results  Component Value Date   HGBA1C 7.0 (A) 03/30/2021   HGBA1C 6.8 (H) 11/22/2020   HGBA1C 7.2 (H) 08/08/2020   Lab Results  Component Value Date   MICROALBUR 10.7 (H) 08/08/2020   LDLCALC 44 03/27/2021   CREATININE 0.64 11/22/2020   Lab Results  Component Value Date   MICRALBCREAT 6.6 08/08/2020    Lab Results  Component Value Date   FRUCTOSAMINE 250 03/27/2021   FRUCTOSAMINE 278 10/31/2018   FRUCTOSAMINE 265 07/18/2018      Allergies as of 03/30/2021       Reactions   Farxiga [dapagliflozin] Other (See Comments)   YEAST INFECTION   Januvia [sitagliptin] Diarrhea, Nausea And Vomiting   Lisinopril Cough   Tanzeum [albiglutide] Nausea And Vomiting   Trulicity [dulaglutide] Nausea Only   Adhesive [tape] Rash   Sulfonamide Derivatives Rash        Medication List        Accurate as of March 30, 2021  4:11 PM. If you have any questions, ask your nurse or doctor.          STOP taking these medications    atorvastatin 80 MG tablet Commonly known as: LIPITOR Stopped by: Elayne Snare, MD       TAKE these medications    Albuterol Sulfate 108 (90 Base) MCG/ACT Aepb Commonly known as: PROAIR RESPICLICK Inhale 2 puffs into the lungs as needed (shortness of breath).   budesonide-formoterol 160-4.5 MCG/ACT inhaler Commonly known as: SYMBICORT Inhale 2 puffs into the lungs 2 (two) times daily as needed (asthma).   cyanocobalamin  1000 MCG/ML injection Commonly known as: (VITAMIN B-12) SMARTSIG:Milliliter(s) Injection Once a Month   Dexcom G6 Receiver Devi Use to check blood sugars.   Dexcom G6 Sensor Misc USE TO CHECK BLOOD SUGAR  Dexcom G6 Transmitter Misc USE TO CHECK BLOOD SUGAR   ezetimibe 10 MG tablet Commonly known as: ZETIA TAKE (1) TABLET BY MOUTH ONCE DAILY.   fenofibrate 145 MG tablet Commonly known as: TRICOR TAKE (1) TABLET BY MOUTH ONCE DAILY.   glucose blood test strip use 1 strip as directed   glucose blood test strip Commonly known as: ONE TOUCH ULTRA TEST Use as instructed to test 3 times daily   HumuLIN R U-500 KwikPen 500 UNIT/ML KwikPen Generic drug: insulin regular human CONCENTRATED INJECT 80-110 UNITS BEFORE MEALS AND AT BEDTIME. ADJUST AS DIRECTED   Insulin Pen Needle 31G X 5 MM Misc Use for insulin and Victoza   Iron-Vitamin C 100-250 MG Tabs   Magnesium 250 MG Tabs Take 250 mg by mouth daily.   metoprolol tartrate 50 MG tablet Commonly known as: LOPRESSOR TAKE (1) TABLET BY MOUTH TWICE DAILY.   NovoLOG FlexPen 100 UNIT/ML FlexPen Generic drug: insulin aspart INJECT 30 TO 50 UNITS BEFORE DINNERTIME FOR HIGH CARBOHYDRATE MEALS.   omeprazole 20 MG capsule Commonly known as: PRILOSEC Take 20 mg by mouth 2 (two) times daily before a meal.   rosuvastatin 20 MG tablet Commonly known as: Crestor Take 1 tablet (20 mg total) by mouth daily. What changed:  medication strength See the new instructions. Changed by: Elayne Snare, MD   tirzepatide 5 MG/0.5ML Pen Commonly known as: MOUNJARO Inject 5 mg into the skin once a week.   Toujeo Max SoloStar 300 UNIT/ML Solostar Pen Generic drug: insulin glargine (2 Unit Dial) Inject 110 Units into the skin daily.   Toujeo SoloStar 300 UNIT/ML Solostar Pen Generic drug: insulin glargine (1 Unit Dial) INJECT 120 UNITS INTO THE SKIN DAILY   valsartan 320 MG tablet Commonly known as: DIOVAN Take 320 mg by mouth daily.    venlafaxine XR 150 MG 24 hr capsule Commonly known as: EFFEXOR-XR Take 150 mg by mouth daily with breakfast.   vitamin C 500 MG tablet Commonly known as: ASCORBIC ACID Take 500 mg by mouth daily.   Vitamin D (Cholecalciferol) 25 MCG (1000 UT) Caps Take 1,000 Units by mouth daily.        Allergies:  Allergies  Allergen Reactions   Farxiga [Dapagliflozin] Other (See Comments)    YEAST INFECTION    Januvia [Sitagliptin] Diarrhea and Nausea And Vomiting   Lisinopril Cough   Tanzeum [Albiglutide] Nausea And Vomiting   Trulicity [Dulaglutide] Nausea Only   Adhesive [Tape] Rash   Sulfonamide Derivatives Rash    Past Medical History:  Diagnosis Date   Allergy    seasonal and environmental   Anemia 2011   before hysterectemy   Asthma    Complication of anesthesia    hard time waking up   Depression    Diabetes [facility])    Dysrhythmia    father died at age 104 due to MI, heart cath in 10/2018, no intervention   GERD (gastroesophageal reflux disease)    History of kidney stones    Hyperlipidemia    Hypertension    Kidney cysts    Kidney stones    Migraine headache    PONV (postoperative nausea and vomiting)     Past Surgical History:  Procedure Laterality Date   ABDOMINAL HYSTERECTOMY     CARDIAC CATHETERIZATION     CESAREAN SECTION     x2   CHOLECYSTECTOMY     LEFT HEART CATH AND CORONARY ANGIOGRAPHY N/A 10/29/2017   Procedure: LEFT HEART CATH AND CORONARY ANGIOGRAPHY;  Surgeon: Martinique, Peter M, MD;  Location: [place] [facility];  Service: Cardiovascular;  Laterality: N/A;   LITHOTRIPSY     x3   NASAL SEPTOPLASTY W/ TURBINOPLASTY Bilateral 11/04/2019   Procedure: NASAL SEPTOPLASTY WITH TURBINATE REDUCTION;  Surgeon: Jerrell Belfast, MD;  Location: [place];  Service: ENT;  Laterality: Bilateral;   none     TONSILLECTOMY      Family History  Problem Relation Age of Onset   Diabetes Mother    Diabetes Maternal Grandmother    Thyroid disease  Cousin    Colon cancer Neg Hx    Colon polyps Neg Hx    Esophageal cancer Neg Hx    Rectal cancer Neg Hx    Stomach cancer Neg Hx     Social History:  reports that she has never smoked. She has never used smokeless tobacco. She reports current alcohol use. She reports that she does not use drugs.   Review of Systems   HYPERLIPIDEMIA:  She has mixed hyperlipidemia She has been on Crestor  Also taking Zetia and fenofibrate  Triglycerides are much better with her trying to take her medications regularly LDL is now only 44     Lab Results  Component Value Date   CHOL 115 03/27/2021   CHOL 211 (H) 11/22/2020   CHOL 153 08/08/2020   Lab Results  Component Value Date   HDL 32.90 (L) 03/27/2021   HDL 31.80 (L) 11/22/2020   HDL 29.80 (L) 08/08/2020   Lab Results  Component Value Date   LDLCALC 44 03/27/2021   LDLCALC 71 03/25/2019   Lab Results  Component Value Date   TRIG 189.0 (H) 03/27/2021   TRIG 398.0 (H) 11/22/2020   TRIG 348.0 (H) 08/08/2020   Lab Results  Component Value Date   CHOLHDL 3 03/27/2021   CHOLHDL 7 11/22/2020   CHOLHDL 5 08/08/2020   Lab Results  Component Value Date   LDLDIRECT 137.0 11/22/2020   LDLDIRECT 95.0 08/08/2020   LDLDIRECT 138.0 10/07/2019            Hypertension: Blood pressure has been high since her 31s, managed by PCP with valsartan 320 mg   BP Readings from Last 3 Encounters:  03/30/21 112/80  12/08/20 114/64  11/24/20 124/90   Microalbuminuria: Controlled as of 7/22  Most recent eye exam was In 3/22  Most recent foot exam: 03/2018    Physical Examination:  BP 112/80    Pulse 95    Ht 4\' 11"  (1.499 m)    Wt 188 lb (85.3 kg)    SpO2 97%    BMI 37.97 kg/m       ASSESSMENT:  Diabetes type 2, insulin requiring  See history of present illness for detailed discussion of current diabetes management, blood sugar patterns and problems identified  Her A1c is now 7% and about the same  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, currently on U-500 insulin along with Toujeo  Most of her insulin regimen is with the U-500 regular insulin and taking only 30 units of Toujeo As before she is intolerant to GLP-1 drugs and also Mounjaro because of nausea  She does have some variability in her blood sugars but tends to be significantly hyperglycemic late evening but right after her meal usually and average blood sugar is over 200 between 8 PM-midnight Despite this she does not increase her suppertime coverage as directed Fasting readings are better  She has been  intolerant to  SGLT2 drugs  HYPERLIPIDEMIA: Triglycerides are better controlled and also LDL is much lower with her getting very compliant with her medications   HYPERTENSION: Blood pressure is controlled  Severe obesity based on her problems of hypertension, hyperlipidemia and type 2 diabetes: Consider referral to bariatric medical program  PLAN:   Restart regular walking for exercise  Increase suppertime Humulin R up to 140 to try and keep her bedtime readings at least below 180 consistently Reduce Toujeo by 10 units while increasing suppertime dose and continue to adjust based on fasting readings She will need to make sure she takes adequate insulin to cover her lunch meal which is not always adequately covered We will try to see the she now has coverage for the OmniPod 5 pump  Reminded her to check blood pressure regularly at home    Patient Instructions  Toujeo 30 units  Supper take 140 Humulin R before supper            Elayne Snare 03/30/2021, 4:11 PM   Note: This office note was prepared with Dragon voice recognition system technology. Any transcriptional errors that result from this process are unintentional.

## 2021-03-31 MED ORDER — OMNIPOD 5 DEXG7G6 INTRO GEN 5 KIT: 1.0000 | PACK | Freq: Once | 0 refills | Status: AC

## 2021-03-31 MED ORDER — OMNIPOD 5 DEXG7G6 PODS GEN 5 MISC
1.0000 | 3 refills | Status: DC
Start: 2021-03-31 — End: 2021-09-28

## 2021-04-06 ENCOUNTER — Telehealth: Payer: Self-pay

## 2021-04-06 NOTE — Telephone Encounter (Signed)
Patient called in stating PA is needed for her omnipod. Can you please start? ?

## 2021-04-07 ENCOUNTER — Other Ambulatory Visit (HOSPITAL_COMMUNITY): Payer: Self-pay

## 2021-04-07 ENCOUNTER — Telehealth: Payer: Self-pay

## 2021-04-07 NOTE — Telephone Encounter (Signed)
Patient Advocate Encounter ?  ?Received notification from patient calls that prior authorization for Omnipod Pump is required by his/her insurance [employer] of [place]. ?  ?PA submitted on 04/07/21 ? ?Key#: [id number] ? ?Status is pending ?   ?[facility] will continue to follow: ? ?Patient Advocate ?Fax: [phone]  ?

## 2021-04-10 ENCOUNTER — Other Ambulatory Visit: Payer: Self-pay | Admitting: Endocrinology

## 2021-04-10 ENCOUNTER — Other Ambulatory Visit (HOSPITAL_COMMUNITY): Payer: Self-pay

## 2021-04-10 DIAGNOSIS — Z794 Long term (current) use of insulin: Secondary | ICD-10-CM

## 2021-04-10 DIAGNOSIS — E1165 Type 2 diabetes mellitus with hyperglycemia: Secondary | ICD-10-CM

## 2021-04-10 NOTE — Telephone Encounter (Signed)
Patient Advocate Encounter ? ?Prior Authorization for Omnipod 5 G6 kit has been approved.   ? ?PA# [id number] ? ?Effective dates: 04/07/21 through 04/08/22 ? ?Per Test Claim Patients co-pay is $70.  ? ?Spoke with Pharmacy to Process. ? ?Patient Advocate ?Fax: [phone] ?

## 2021-04-13 ENCOUNTER — Encounter: Payer: Self-pay | Admitting: Endocrinology

## 2021-04-14 ENCOUNTER — Other Ambulatory Visit: Payer: Self-pay | Admitting: Endocrinology

## 2021-04-25 ENCOUNTER — Telehealth: Payer: Self-pay

## 2021-04-25 NOTE — Telephone Encounter (Signed)
Patient is going on omnipod pump and needs her kwikpens to go to vials. Since she is going on Pump I need to know what the instructions are? Please advise ?

## 2021-04-26 DIAGNOSIS — N181 Chronic kidney disease, stage 1: Secondary | ICD-10-CM | POA: Diagnosis not present

## 2021-04-26 DIAGNOSIS — E782 Mixed hyperlipidemia: Secondary | ICD-10-CM | POA: Diagnosis not present

## 2021-04-26 DIAGNOSIS — E1122 Type 2 diabetes mellitus with diabetic chronic kidney disease: Secondary | ICD-10-CM | POA: Diagnosis not present

## 2021-04-26 DIAGNOSIS — I129 Hypertensive chronic kidney disease with stage 1 through stage 4 chronic kidney disease, or unspecified chronic kidney disease: Secondary | ICD-10-CM | POA: Diagnosis not present

## 2021-04-26 DIAGNOSIS — Z0000 Encounter for general adult medical examination without abnormal findings: Secondary | ICD-10-CM | POA: Diagnosis not present

## 2021-04-26 DIAGNOSIS — Z23 Encounter for immunization: Secondary | ICD-10-CM | POA: Diagnosis not present

## 2021-05-01 ENCOUNTER — Emergency Department (HOSPITAL_COMMUNITY)
Admission: EM | Admit: 2021-05-01 | Discharge: 2021-05-01 | Disposition: A | Discharge status: Home / Self Care | Payer: BC Managed Care – PPO | Attending: Student | Admitting: Student

## 2021-05-01 ENCOUNTER — Other Ambulatory Visit: Payer: Self-pay

## 2021-05-01 ENCOUNTER — Emergency Department (HOSPITAL_COMMUNITY): Payer: BC Managed Care – PPO

## 2021-05-01 DIAGNOSIS — Z794 Long term (current) use of insulin: Secondary | ICD-10-CM | POA: Insufficient documentation

## 2021-05-01 DIAGNOSIS — R29810 Facial weakness: Secondary | ICD-10-CM | POA: Diagnosis not present

## 2021-05-01 DIAGNOSIS — Z20822 Contact with and (suspected) exposure to covid-19: Secondary | ICD-10-CM | POA: Diagnosis not present

## 2021-05-01 DIAGNOSIS — R200 Anesthesia of skin: Secondary | ICD-10-CM | POA: Insufficient documentation

## 2021-05-01 DIAGNOSIS — E119 Type 2 diabetes mellitus without complications: Secondary | ICD-10-CM | POA: Insufficient documentation

## 2021-05-01 DIAGNOSIS — G510 Bell's palsy: Secondary | ICD-10-CM

## 2021-05-01 DIAGNOSIS — I10 Essential (primary) hypertension: Secondary | ICD-10-CM | POA: Insufficient documentation

## 2021-05-01 DIAGNOSIS — Z9861 Coronary angioplasty status: Secondary | ICD-10-CM | POA: Diagnosis not present

## 2021-05-01 DIAGNOSIS — Z79899 Other long term (current) drug therapy: Secondary | ICD-10-CM | POA: Diagnosis not present

## 2021-05-01 DIAGNOSIS — R531 Weakness: Secondary | ICD-10-CM | POA: Diagnosis not present

## 2021-05-01 DIAGNOSIS — J45909 Unspecified asthma, uncomplicated: Secondary | ICD-10-CM | POA: Insufficient documentation

## 2021-05-01 LAB — CBC
HCT: 46.9 % — ABNORMAL HIGH (ref 36.0–46.0)
Hemoglobin: 16.1 g/dL — ABNORMAL HIGH (ref 12.0–15.0)
MCH: 30 pg (ref 26.0–34.0)
MCHC: 34.3 g/dL (ref 30.0–36.0)
MCV: 87.3 fL (ref 80.0–100.0)
Platelets: 245 10*3/uL (ref 150–400)
RBC: 5.37 MIL/uL — ABNORMAL HIGH (ref 3.87–5.11)
RDW: 12.7 % (ref 11.5–15.5)
WBC: 7.8 10*3/uL (ref 4.0–10.5)
nRBC: 0 % (ref 0.0–0.2)

## 2021-05-01 LAB — DIFFERENTIAL
Abs Immature Granulocytes: 0.02 10*3/uL (ref 0.00–0.07)
Basophils Absolute: 0 10*3/uL (ref 0.0–0.1)
Basophils Relative: 1 %
Eosinophils Absolute: 0.2 10*3/uL (ref 0.0–0.5)
Eosinophils Relative: 3 %
Immature Granulocytes: 0 %
Lymphocytes Relative: 35 %
Lymphs Abs: 2.7 10*3/uL (ref 0.7–4.0)
Monocytes Absolute: 0.7 10*3/uL (ref 0.1–1.0)
Monocytes Relative: 9 %
Neutro Abs: 4.1 10*3/uL (ref 1.7–7.7)
Neutrophils Relative %: 52 %

## 2021-05-01 LAB — I-STAT CHEM 8, ED
BUN: 18 mg/dL (ref 6–20)
Calcium, Ion: 1.15 mmol/L (ref 1.15–1.40)
Chloride: 101 mmol/L (ref 98–111)
Creatinine, Ser: 0.6 mg/dL (ref 0.44–1.00)
Glucose, Bld: 245 mg/dL — ABNORMAL HIGH (ref 70–99)
HCT: 48 % — ABNORMAL HIGH (ref 36.0–46.0)
Hemoglobin: 16.3 g/dL — ABNORMAL HIGH (ref 12.0–15.0)
Potassium: 4.3 mmol/L (ref 3.5–5.1)
Sodium: 139 mmol/L (ref 135–145)
TCO2: 28 mmol/L (ref 22–32)

## 2021-05-01 LAB — COMPREHENSIVE METABOLIC PANEL
ALT: 27 U/L (ref 0–44)
AST: 29 U/L (ref 15–41)
Albumin: 4.2 g/dL (ref 3.5–5.0)
Alkaline Phosphatase: 76 U/L (ref 38–126)
Anion gap: 9 (ref 5–15)
BUN: 14 mg/dL (ref 6–20)
CO2: 27 mmol/L (ref 22–32)
Calcium: 10.2 mg/dL (ref 8.9–10.3)
Chloride: 101 mmol/L (ref 98–111)
Creatinine, Ser: 0.69 mg/dL (ref 0.44–1.00)
GFR, Estimated: >60 mL/min (ref >60)
Glucose, Bld: 239 mg/dL — ABNORMAL HIGH (ref 70–99)
Potassium: 4.3 mmol/L (ref 3.5–5.1)
Sodium: 137 mmol/L (ref 135–145)
Total Bilirubin: 0.8 mg/dL (ref 0.3–1.2)
Total Protein: 8.3 g/dL — ABNORMAL HIGH (ref 6.5–8.1)

## 2021-05-01 LAB — I-STAT BETA HCG BLOOD, ED (MC, WL, AP ONLY): I-stat hCG, quantitative: 11 m[IU]/mL — ABNORMAL HIGH (ref <5)

## 2021-05-01 LAB — RESP PANEL BY RT-PCR (FLU A&B, COVID) ARPGX2
Influenza A by PCR: NEGATIVE
Influenza B by PCR: NEGATIVE
SARS Coronavirus 2 by RT PCR: NEGATIVE

## 2021-05-01 LAB — PROTIME-INR
INR: 1 (ref 0.8–1.2)
Prothrombin Time: 13.2 seconds (ref 11.4–15.2)

## 2021-05-01 LAB — CBG MONITORING, ED: Glucose-Capillary: 234 mg/dL — ABNORMAL HIGH (ref 70–99)

## 2021-05-01 LAB — ETHANOL: Alcohol, Ethyl (B): <10 mg/dL (ref <10)

## 2021-05-01 LAB — APTT: aPTT: 26 seconds (ref 24–36)

## 2021-05-01 MED ORDER — VALACYCLOVIR HCL 1 G PO TABS: 1000.0000 mg | ORAL_TABLET | Freq: Three times a day (TID) | ORAL | 0 refills | Status: AC

## 2021-05-01 MED ORDER — LORAZEPAM 2 MG/ML IJ SOLN
1.0000 mg | Freq: Once | INTRAMUSCULAR | Status: AC | PRN
  Administered 2021-05-01: 1 mg via INTRAVENOUS
  Filled 2021-05-01: qty 1

## 2021-05-01 MED ORDER — POLYVINYL ALCOHOL 1.4 % OP SOLN
1.0000 [drp] | OPHTHALMIC | Status: DC | PRN
  Filled 2021-05-01: qty 15

## 2021-05-01 MED ORDER — ARTIFICIAL TEARS OPHTHALMIC OINT: 1.0000 "application " | TOPICAL_OINTMENT | Freq: Once | OPHTHALMIC | Status: DC

## 2021-05-01 MED ORDER — HYPROMELLOSE (GONIOSCOPIC) 2.5 % OP SOLN: 1.0000 [drp] | Freq: Three times a day (TID) | OPHTHALMIC | 12 refills | Status: DC

## 2021-05-01 MED ORDER — PREDNISONE 20 MG PO TABS: 60.0000 mg | ORAL_TABLET | Freq: Every day | ORAL | 0 refills | Status: AC

## 2021-05-01 NOTE — ED Provider Triage Note (Signed)
Emergency Medicine Provider Triage Evaluation Note ? ?[name] , a 51 y.o. female  was evaluated in triage.  Pt complains of left sided facial numbness after waking up from a nap at 1430 today. Last known well around 1200 today. She denies any chest pain or SOB. Reports that she has a headache. No blurred vision. Not on a blood thinner. ? ?Review of Systems  ?Positive: Headache, facial numbness  ?Negative: Chest pain, SOB, blurry vision ? ?Physical Exam  ?BP (!) 122/94 (BP Location: Right Arm)   Pulse 83   Temp 98.4 ?F (36.9 ?C) (Oral)   Resp 18   SpO2 98%  ?Gen:   Awake, no distress   ?Resp:  Normal effort  ?MSK:   Moves extremities without difficulty  ?Other:  Left sided facial droop forehead sparing, tears present, left sided upper and lower extremity sensation deficit no motor deficits.  ? ?Medical Decision Making  ?Medically screening exam initiated at 5:12 PM.  Appropriate orders placed.  RENAY CRAMMER was informed that the remainder of the evaluation will be completed by another provider, this initial triage assessment does not replace that evaluation, and the importance of remaining in the ED until their evaluation is complete. ? ?CODE stroke activated. ED stroke order set place. Charge and nursing and CT made aware. The patient will be going to room 6.  ?  ?Sherrell Puller, PA-C ?05/01/21 1715 ? ?

## 2021-05-01 NOTE — Code Documentation (Signed)
Stroke Response Nurse Documentation ?Code Documentation ? ?Amy Clarke is a 51 y.o. female arriving to [facility]  via [employer] on 05/02/19 with past medical hx of DM, HLD, HTN, migraines. On No antithrombotic. Code stroke was activated by ED triage.  ? ?Patient from home where she was LKW at 1200 prior to taking a nap.  Patient woke up at 1430 with left eye tearing up and slight left facial droop. Patient states she received 2nd shingles vaccine on Thursday 3/30. ? ?Stroke team at the bedside on patient activation. Labs drawn in triage.  Patient to CT with team. NIHSS 1, see documentation for details and code stroke times. Patient with left facial droop on exam.  ? ?The following imaging was completed:  CT Head. Patient is not a candidate for IV Thrombolytic due to MD feels patient presenting with Bells Palsy. Patient is not not a candidate for IR due MD presenting with Bells Palsy.  ? ?Care Plan: cancel code stroke.  ? ?Bedside handoff with ED RN Kae Heller.   ? ?Newman Nickels  ?Stroke Response RN ? ? ?

## 2021-05-01 NOTE — ED Notes (Signed)
Patient transported to MRI 

## 2021-05-01 NOTE — ED Triage Notes (Signed)
Pt here for L side facial numbness that she woke up with at 1430. Pt went to take a nap at 1200. In ttriage pt has L side facial droop, numbness to L face, L arm and L leg. Hx diabetes w/ neuropathy ?

## 2021-05-01 NOTE — Consult Note (Addendum)
NEUROLOGY CONSULTATION NOTE  ? ?Date of service: May 01, 2021 ?Patient Name: Amy Clarke ?MRN:  [id number] ?DOB:  10/07/1970 ?Reason for consult: "Code Stroke with acute onset of drooling from left of mouth, Left facial droop and numbness" NIHSS-2 for facial droop and sensory deficit ?Requesting Provider: Teressa Lower, MD ? ?History of Present Illness  ?KNOX HOLDMAN is a 51 y.o. female with no history of stroke or Bell's Palsy who presented to [facility] ED with Left facial droop, drooling, left eye unable to close and left facial numbness. History given by patient and husband. Mrs. Borenstein says she went to sleep today for a nap around 1200 noon. When she awoke at 1430 she noted drooling from the left side of mouth with tongue numbness, inability to activate left sided facial muscles including left eye unable to close and a dull generalized headache. She presented to the ED and Neurology was asked to consult.  ? ?It is of note that Mrs. Stucker had her second Shingles vaccination Thursday 04/27/21. ?  ?ROS  ? ?Constitutional Denies weight loss, fever and chills.   ?HEENT Denies changes in hearing. Left eye unable to close, Tongue numbness and drooling  ?Respiratory Denies SOB and cough.   ?CV Denies palpitations and CP   ?GI Denies abdominal pain, nausea, vomiting and diarrhea.   ?GU Denies dysuria and urinary frequency.   ?MSK Denies myalgia and joint pain.   ?Skin Denies rash and pruritus.   ?Neurological Denies headache and syncope.   ?Psychiatric Denies recent changes in mood. Denies anxiety and depression.   ? ?Past History  ? ?Past Medical History:  ?Diagnosis Date  ? Allergy   ? seasonal and environmental  ? Anemia 2011  ? before hysterectemy  ? Asthma   ? Complication of anesthesia   ? hard time waking up  ? Depression   ? Diabetes ([place])   ? Dysrhythmia   ? father died at age 27 due to MI, heart cath in 10/2018, no intervention  ? GERD (gastroesophageal reflux disease)   ? History of kidney stones   ?  Hyperlipidemia   ? Hypertension   ? Kidney cysts   ? Kidney stones   ? Migraine headache   ? PONV (postoperative nausea and vomiting)   ? ?Past Surgical History:  ?Procedure Laterality Date  ? ABDOMINAL HYSTERECTOMY    ? CARDIAC CATHETERIZATION    ? CESAREAN SECTION    ? x2  ? CHOLECYSTECTOMY    ? LEFT HEART CATH AND CORONARY ANGIOGRAPHY N/A 10/29/2017  ? Procedure: LEFT HEART CATH AND CORONARY ANGIOGRAPHY;  Surgeon: Martinique, Peter M, MD;  Location: [place] [facility];  Service: Cardiovascular;  Laterality: N/A;  ? LITHOTRIPSY    ? x3  ? NASAL SEPTOPLASTY W/ TURBINOPLASTY Bilateral 11/04/2019  ? Procedure: NASAL SEPTOPLASTY WITH TURBINATE REDUCTION;  Surgeon: Jerrell Belfast, MD;  Location: [place];  Service: ENT;  Laterality: Bilateral;  ? none    ? TONSILLECTOMY    ? ?Family History  ?Problem Relation Age of Onset  ? Diabetes Mother   ? Diabetes Maternal Grandmother   ? Thyroid disease Cousin   ? Colon cancer Neg Hx   ? Colon polyps Neg Hx   ? Esophageal cancer Neg Hx   ? Rectal cancer Neg Hx   ? Stomach cancer Neg Hx   ? ?Social History  ? ?Socioeconomic History  ? Marital status: Married  ?  Spouse name: Not on file  ? Number of  children: 4  ? Years of education: Not on file  ? Highest education level: Not on file  ?Occupational History  ? Occupation: unemployed  ?Tobacco Use  ? Smoking status: Never  ? Smokeless tobacco: Never  ?Vaping Use  ? Vaping Use: Never used  ?Substance and Sexual Activity  ? Alcohol use: Yes  ?  Comment: social, less than weekly  ? Drug use: Never  ? Sexual activity: Not on file  ?Other Topics Concern  ? Not on file  ?Social History Narrative  ? Not on file  ? ?Social Determinants of Health  ? ?Financial Resource Strain: Not on file  ?Food Insecurity: Not on file  ?Transportation Needs: Not on file  ?Physical Activity: Not on file  ?Stress: Not on file  ?Social Connections: Not on file  ? ?Allergies  ?Allergen Reactions  ? Wilder Glade [Dapagliflozin] Other (See Comments)  ?   YEAST INFECTION ?  ? Januvia [Sitagliptin] Diarrhea and Nausea And Vomiting  ? Lisinopril Cough  ? Tanzeum [Albiglutide] Nausea And Vomiting  ? Trulicity [Dulaglutide] Nausea Only  ? Adhesive [Tape] Rash  ? Sulfonamide Derivatives Rash  ? ? ?Prior to Admission Medications  ? ?No current facility-administered medications for this encounter.  ? ?Current Outpatient Medications  ?Medication Sig Dispense Refill Last Dose  ? Albuterol Sulfate 108 (90 Base) MCG/ACT AEPB Inhale 2 puffs into the lungs as needed (shortness of breath).      ? budesonide-formoterol (SYMBICORT) 160-4.5 MCG/ACT inhaler Inhale 2 puffs into the lungs 2 (two) times daily as needed (asthma).      ? Continuous Blood Gluc Receiver (DEXCOM G6 RECEIVER) DEVI Use to check blood sugars. 1 each 0   ? Continuous Blood Gluc Sensor (DEXCOM G6 SENSOR) MISC USE TO CHECK BLOOD SUGAR 3 each 0   ? Continuous Blood Gluc Transmit (DEXCOM G6 TRANSMITTER) MISC USE TO CHECK BLOOD SUGAR 1 each 0   ? cyanocobalamin (,VITAMIN B-12,) 1000 MCG/ML injection SMARTSIG:Milliliter(s) Injection Once a Month (Patient not taking: Reported on 03/30/2021)     ? ezetimibe (ZETIA) 10 MG tablet TAKE (1) TABLET BY MOUTH ONCE DAILY. 30 tablet 0   ? fenofibrate (TRICOR) 145 MG tablet TAKE (1) TABLET BY MOUTH ONCE DAILY. 30 tablet 0   ? glucose blood (ONE TOUCH ULTRA TEST) test strip Use as instructed to test 3 times daily 300 each 3   ? glucose blood test strip use 1 strip as directed     ? HUMULIN R U-500 KWIKPEN 500 UNIT/ML KwikPen INJECT 80-110 UNITS BEFORE MEALS AND AT BEDTIME. ADJUST AS DIRECTED 18 mL 0   ? Insulin Disposable Pump (OMNIPOD 5 G6 POD, GEN 5,) MISC 1 Device by Does not apply route every 3 (three) days. 2 each 3   ? insulin glargine, 2 Unit Dial, (TOUJEO MAX SOLOSTAR) 300 UNIT/ML Solostar Pen Inject 110 Units into the skin daily. (Patient not taking: Reported on 03/30/2021) 18 mL 1   ? Insulin Pen Needle 31G X 5 MM MISC Use for insulin and Victoza 200 each 1   ? Iron-Vitamin C  100-250 MG TABS  (Patient not taking: Reported on 03/30/2021)     ? Magnesium 250 MG TABS Take 250 mg by mouth daily. (Patient not taking: Reported on 03/30/2021)     ? metoprolol tartrate (LOPRESSOR) 50 MG tablet TAKE (1) TABLET BY MOUTH TWICE DAILY. 180 tablet 3   ? NOVOLOG FLEXPEN 100 UNIT/ML FlexPen INJECT 30 TO 50 UNITS BEFORE DINNERTIME FOR HIGH CARBOHYDRATE MEALS. 15 mL  0   ? omeprazole (PRILOSEC) 20 MG capsule Take 20 mg by mouth 2 (two) times daily before a meal.     ? rosuvastatin (CRESTOR) 20 MG tablet Take 1 tablet (20 mg total) by mouth daily. 90 tablet 3   ? tirzepatide [facility]) 5 MG/0.5ML Pen Inject 5 mg into the skin once a week. (Patient not taking: Reported on 03/30/2021) 2 mL 1   ? TOUJEO SOLOSTAR 300 UNIT/ML Solostar Pen INJECT 120 UNITS INTO THE SKIN DAILY 9 mL 0   ? valsartan (DIOVAN) 320 MG tablet Take 320 mg by mouth daily.      ? venlafaxine XR (EFFEXOR-XR) 150 MG 24 hr capsule Take 150 mg by mouth daily with breakfast.     ? vitamin C (ASCORBIC ACID) 500 MG tablet Take 500 mg by mouth daily. (Patient not taking: Reported on 03/30/2021)     ? Vitamin D, Cholecalciferol, 1000 units CAPS Take 1,000 Units by mouth daily.  (Patient not taking: Reported on 03/30/2021)     ?  ? ?  ? ?Vitals  ? ?Vitals:  ? 05/01/21 1708 05/01/21 1730 05/01/21 1745  ?BP: (!) 122/94 125/73   ?Pulse: 83  80  ?Resp: 18  20  ?Temp: 98.4 ?F (36.9 ?C)    ?TempSrc: Oral    ?SpO2: 98%  95%  ?  ? ?There is no height or weight on file to calculate BMI. ? ?Physical Exam  ? ?General: Laying comfortably in bed; in no acute distress.  ?HENT: Normal oropharynx and mucosa. Normal external appearance of ears and nose. No shingles noted in ears ?Neck: Supple, no pain or tenderness  ?CV: No JVD. No peripheral edema.  ?Pulmonary: Symmetric Chest rise. Normal respiratory effort.  ?Abdomen: Soft to touch, non-tender.  ?Ext: No cyanosis, edema, or deformity  ?Skin: No rash. Normal palpation of skin.   ?Musculoskeletal: Normal digits and nails by  inspection. No clubbing.  ? ?Neurologic Examination  ? ?Mental Status: ?Patient is awake, alert, oriented to person, place, month, year, and situation. ?Patient is able to give a clear and coherent history. ?Sp

## 2021-05-01 NOTE — ED Notes (Signed)
Pt verbalized understanding of d/c instructions, meds, and followup care. Denies questions. VSS, no distress noted. Steady gait to exit with all belongings.  ?

## 2021-05-01 NOTE — ED Provider Notes (Signed)
?[place] ?Provider Note ? ?CSN: [id number] ?Arrival date & time: 05/01/21 1657 ? ?Chief Complaint(s) ?Numbness and Facial Droop ? ?HPI ?Amy Clarke is a 51 y.o. female who presents emergency department for evaluation of numbness and facial droop.  Patient states that she awoke from a nap at 1430 today and attempted to drink through a straw.  She then noticed left-sided tongue numbness and drooling.  Last known well 12:00.  She endorses headache but denies extremity numbness, weakness, vision deficits or other systemic neurologic complaints. ? ?HPI ? ?Past Medical History ?Past Medical History:  ?Diagnosis Date  ? Allergy   ? seasonal and environmental  ? Anemia 2011  ? before hysterectemy  ? Asthma   ? Complication of anesthesia   ? hard time waking up  ? Depression   ? Diabetes ([place])   ? Dysrhythmia   ? father died at age 9 due to MI, heart cath in 10/2018, no intervention  ? GERD (gastroesophageal reflux disease)   ? History of kidney stones   ? Hyperlipidemia   ? Hypertension   ? Kidney cysts   ? Kidney stones   ? Migraine headache   ? PONV (postoperative nausea and vomiting)   ? ?Patient Active Problem List  ? Diagnosis Date Noted  ? Deviated septum 11/04/2019  ? Normal coronary arteries 10/29/2017  ? Chest pain   ? Unstable angina ([place]) 10/28/2017  ? Mixed hyperlipidemia 09/11/2017  ? COUGH 08/04/2009  ? Type 2 diabetes mellitus without complication, without long-term current use of insulin ([place]) 08/03/2009  ? PURE HYPERCHOLESTEROLEMIA 08/03/2009  ? Obesity, unspecified 08/03/2009  ? DEPRESSION 08/03/2009  ? HYPERTENSION, BENIGN ESSENTIAL 08/03/2009  ? ALLERGIC RHINITIS DUE TO POLLEN 08/03/2009  ? ASTHMA 08/03/2009  ? GERD 08/03/2009  ? ?Home Medication(s) ?Prior to Admission medications   ?Medication Sig Start Date End Date Taking? Authorizing Provider  ?Albuterol Sulfate 108 (90 Base) MCG/ACT AEPB Inhale 2 puffs into the lungs as needed (shortness of breath).      [provider]  ?budesonide-formoterol (SYMBICORT) 160-4.5 MCG/ACT inhaler Inhale 2 puffs into the lungs 2 (two) times daily as needed (asthma).     [provider]  ?Continuous Blood Gluc Receiver (DEXCOM G6 RECEIVER) DEVI Use to check blood sugars. 10/09/19   Elayne Snare, MD  ?Continuous Blood Gluc Sensor (DEXCOM G6 SENSOR) MISC USE TO CHECK BLOOD SUGAR 04/11/21   Elayne Snare, MD  ?Continuous Blood Gluc Transmit (DEXCOM G6 TRANSMITTER) MISC USE TO CHECK BLOOD SUGAR 03/03/21   Elayne Snare, MD  ?cyanocobalamin (,VITAMIN B-12,) 1000 MCG/ML injection SMARTSIG:Milliliter(s) Injection Once a Month ?Patient not taking: Reported on 03/30/2021 08/27/19   [provider]  ?ezetimibe (ZETIA) 10 MG tablet TAKE (1) TABLET BY MOUTH ONCE DAILY. 04/11/21   Elayne Snare, MD  ?fenofibrate (TRICOR) 145 MG tablet TAKE (1) TABLET BY MOUTH ONCE DAILY. 03/10/21   Elayne Snare, MD  ?glucose blood (ONE TOUCH ULTRA TEST) test strip Use as instructed to test 3 times daily 09/11/17   Elayne Snare, MD  ?glucose blood test strip use 1 strip as directed 01/25/17   [provider]  ?HUMULIN R U-500 KWIKPEN 500 UNIT/ML KwikPen INJECT 80-110 UNITS BEFORE MEALS AND AT BEDTIME. ADJUST AS DIRECTED 04/14/21   Elayne Snare, MD  ?Insulin Disposable Pump (OMNIPOD 5 G6 POD, GEN 5,) MISC 1 Device by Does not apply route every 3 (three) days. 03/31/21   Elayne Snare, MD  ?insulin glargine, 2 Unit Dial, [facility]  MAX SOLOSTAR) 300 UNIT/ML Solostar Pen Inject 110 Units into the skin daily. ?Patient not taking: Reported on 03/30/2021 10/10/19   Elayne Snare, MD  ?Insulin Pen Needle 31G X 5 MM MISC Use for insulin and Victoza 09/03/18   Elayne Snare, MD  ?Iron-Vitamin C 100-250 MG TABS     [provider]  ?Magnesium 250 MG TABS Take 250 mg by mouth daily. ?Patient not taking: Reported on 03/30/2021    [provider]  ?metoprolol tartrate (LOPRESSOR) 50 MG tablet TAKE (1) TABLET BY MOUTH TWICE DAILY. 05/04/20   Josue Hector, MD   ?NOVOLOG FLEXPEN 100 UNIT/ML FlexPen INJECT 30 TO 50 UNITS BEFORE DINNERTIME FOR HIGH CARBOHYDRATE MEALS. 04/11/21   Elayne Snare, MD  ?omeprazole (PRILOSEC) 20 MG capsule Take 20 mg by mouth 2 (two) times daily before a meal. 10/26/20   [provider]  ?rosuvastatin (CRESTOR) 20 MG tablet Take 1 tablet (20 mg total) by mouth daily. 03/30/21   Elayne Snare, MD  ?tirzepatide [facility]) 5 MG/0.5ML Pen Inject 5 mg into the skin once a week. ?Patient not taking: Reported on 03/30/2021 01/24/21   Elayne Snare, MD  ?Nelva Nay SOLOSTAR 300 UNIT/ML Solostar Pen INJECT 120 UNITS INTO THE SKIN DAILY 03/16/21   Elayne Snare, MD  ?valsartan (DIOVAN) 320 MG tablet Take 320 mg by mouth daily.     [provider]  ?venlafaxine XR (EFFEXOR-XR) 150 MG 24 hr capsule Take 150 mg by mouth daily with breakfast.    [provider]  ?vitamin C (ASCORBIC ACID) 500 MG tablet Take 500 mg by mouth daily. ?Patient not taking: Reported on 03/30/2021    [provider]  ?Vitamin D, Cholecalciferol, 1000 units CAPS Take 1,000 Units by mouth daily.  ?Patient not taking: Reported on 03/30/2021    [provider]  ?                                                                                                                                  ?Past Surgical History ?Past Surgical History:  ?Procedure Laterality Date  ? ABDOMINAL HYSTERECTOMY    ? CARDIAC CATHETERIZATION    ? CESAREAN SECTION    ? x2  ? CHOLECYSTECTOMY    ? LEFT HEART CATH AND CORONARY ANGIOGRAPHY N/A 10/29/2017  ? Procedure: LEFT HEART CATH AND CORONARY ANGIOGRAPHY;  Surgeon: Martinique, Peter M, MD;  Location: [place] [facility];  Service: Cardiovascular;  Laterality: N/A;  ? LITHOTRIPSY    ? x3  ? NASAL SEPTOPLASTY W/ TURBINOPLASTY Bilateral 11/04/2019  ? Procedure: NASAL SEPTOPLASTY WITH TURBINATE REDUCTION;  Surgeon: Jerrell Belfast, MD;  Location: [place];  Service: ENT;  Laterality: Bilateral;  ? none    ? TONSILLECTOMY    ? ?Family  History ?Family History  ?Problem Relation Age of Onset  ? Diabetes Mother   ? Diabetes Maternal Grandmother   ? Thyroid disease Cousin   ? Colon  cancer Neg Hx   ? Colon polyps Neg Hx   ? Esophageal cancer Neg Hx   ? Rectal cancer Neg Hx   ? Stomach cancer Neg Hx   ? ? ?Social History ?Social History  ? ?Tobacco Use  ? Smoking status: Never  ? Smokeless tobacco: Never  ?Vaping Use  ? Vaping Use: Never used  ?Substance Use Topics  ? Alcohol use: Yes  ?  Comment: social, less than weekly  ? Drug use: Never  ? ?Allergies ?Farxiga [dapagliflozin], Januvia [sitagliptin], Lisinopril, Tanzeum [albiglutide], Trulicity [dulaglutide], Adhesive [tape], and Sulfonamide derivatives ? ?Review of Systems ?Review of Systems  ?Neurological:  Positive for facial asymmetry and numbness.  ? ?Physical Exam ?Vital Signs  ?I have reviewed the triage vital signs ?BP 125/73   Pulse 83   Temp 98.4 ?F (36.9 ?C) (Oral)   Resp 18   SpO2 98%  ? ?Physical Exam ?Vitals and nursing note reviewed.  ?Constitutional:   ?   General: She is not in acute distress. ?   Appearance: She is well-developed.  ?HENT:  ?   Head: Normocephalic and atraumatic.  ?Eyes:  ?   Conjunctiva/sclera: Conjunctivae normal.  ?Cardiovascular:  ?   Rate and Rhythm: Normal rate and regular rhythm.  ?   Heart sounds: No murmur heard. ?Pulmonary:  ?   Effort: Pulmonary effort is normal. No respiratory distress.  ?   Breath sounds: Normal breath sounds.  ?Abdominal:  ?   Palpations: Abdomen is soft.  ?   Tenderness: There is no abdominal tenderness.  ?Musculoskeletal:     ?   General: No swelling.  ?   Cervical back: Neck supple.  ?Skin: ?   General: Skin is warm and dry.  ?   Capillary Refill: Capillary refill takes less than 2 seconds.  ?Neurological:  ?   Mental Status: She is alert.  ?   Cranial Nerves: Cranial nerve deficit present.  ?   Sensory: Sensory deficit present.  ?Psychiatric:     ?   Mood and Affect: Mood normal.  ? ? ?ED Results and Treatments ?Labs ?(all labs  ordered are listed, but only abnormal results are displayed) ?Labs Reviewed  ?CBC - Abnormal; Notable for the following components:  ?    Result Value  ? RBC 5.37 (*)   ? Hemoglobin 16.1 (*)   ? HCT 46.9 (*)   ? A

## 2021-05-02 ENCOUNTER — Other Ambulatory Visit: Payer: Self-pay

## 2021-05-02 DIAGNOSIS — E1165 Type 2 diabetes mellitus with hyperglycemia: Secondary | ICD-10-CM

## 2021-05-02 MED ORDER — INSULIN REGULAR HUMAN (CONC) 500 UNIT/ML ~~LOC~~ SOLN: SUBCUTANEOUS | 3 refills | Status: DC

## 2021-05-02 NOTE — Telephone Encounter (Signed)
Patient spoke with Vaughan Basta and states she only has 1 and 1/2 pen left to use. Vaughan Basta states she will need vials. They have been ordered. ?

## 2021-05-03 ENCOUNTER — Encounter: Payer: BC Managed Care – PPO | Attending: Endocrinology | Admitting: Nutrition

## 2021-05-03 DIAGNOSIS — E119 Type 2 diabetes mellitus without complications: Secondary | ICD-10-CM | POA: Insufficient documentation

## 2021-05-03 NOTE — Progress Notes (Signed)
Patient is here today to start her OmniPod 5.  She says she was in the hospital Tuesday with a diagnosis of Bells palsy.  She is currently on prednisone, and will be on it for the next 9 days. ?Settings were put into the pump per orders from Dr. Dwyane Dee to double the written orders of basal rate and bolus amounts.  Basal rate:  MN: 3.0, 8AM: 5.0, 10PM: 3.0,  I/C:1, target:MN: 120, 8AM: 130 with corrections over 140, and ISF: 35.  Written directions given for boluses:  when on prednisone: small meal: 20u, Medium meal: 30u and large meal: 40u.  When off prednisone: small meal: 10u, medium meal: 13u, and large meal 20u.   ?She filled a pod with U-500R insulin and she was shown the appropriate places for pod placement with reference to her dexcom.  She did not put the pod in automated mode.  She was told how to do this, but not until she has finishes her prednisone.  She was shown how to bolus and re demonstrated this well, without hesitation.  We reviewed alerts, alarms, high blood sugar and sick day protocols and was given a booklet with these topics in them.  She had no final questions.   ?

## 2021-05-03 NOTE — Patient Instructions (Addendum)
Read over start up booklet, manuel and handouts given. ?Call OmniPod help line if questions. ?

## 2021-05-04 ENCOUNTER — Other Ambulatory Visit: Payer: Self-pay | Admitting: Endocrinology

## 2021-05-04 ENCOUNTER — Ambulatory Visit (INDEPENDENT_AMBULATORY_CARE_PROVIDER_SITE_OTHER): Payer: BC Managed Care – PPO | Admitting: Endocrinology

## 2021-05-04 ENCOUNTER — Encounter: Payer: Self-pay | Admitting: Endocrinology

## 2021-05-04 VITALS — BP 122/72 | HR 87 | Ht 59.0 in | Wt 188.8 lb

## 2021-05-04 DIAGNOSIS — E1165 Type 2 diabetes mellitus with hyperglycemia: Secondary | ICD-10-CM

## 2021-05-04 DIAGNOSIS — Z794 Long term (current) use of insulin: Secondary | ICD-10-CM

## 2021-05-04 NOTE — Progress Notes (Signed)
Patient ID: Amy Clarke, female   DOB: 1970-12-29, 51 y.o.   MRN: [id number] ? ?       ? ? ?Reason for Appointment:  Follow-up for Type 2 Diabetes ? ?Referring physician: Mayra Neer ? ? ?History of Present Illness:  ?        ?Date of diagnosis of type 2 diabetes mellitus: 2003?        ? ?Background history:  ? ?She had gestational diabetes in 1992 and subsequently was on diet alone ?She thinks she was started on diabetes medication about 15 years ago and probably took metformin which she took until about a year ago and this was stopped because of diarrhea.  Records show that she was taking 1000 mg of regular metformin twice a day ?With her PCP she has been on numerous diabetes medications over the years including [place], Trulicity, [place] which apparently all caused side effects, mostly nausea or yeast infections with Vania Rea and [place] ?Her A1c has been mostly higher this year, last year has been as low as 7.2, in January her A1c was 8.4 ? ?Recent history:  ? ?INSULIN regimen previously:  Toujeo 60 units in the morning.  Humulin R U-500, 120 units , 2 or 3 times daily before meals ?Non-insulin hypoglycemic drugs the patient is taking are: None ? ? ?Current management, blood sugar patterns and problems identified: ? ?She started the OMNIPOD insulin pump on 05/03/2021 with the following settings using U-500 insulin ? ?BASAL rates 3.0 from 10 PM-8 AM and 5.0 from 8 AM-10 PM ? ? ?Her A1c is last higher at 7.0 ? ? ?Current diabetes management and problems identified: ? ?Her OmniPod 5 insulin pump was started yesterday but because of being on steroids this was started in the manual mode only ?Although yesterday her blood sugars went up after lunch despite taking the 30 units bolus her POSTPRANDIAL readings have not been as consistently high since then ?She bolused only 10 units in the evening when she had some cereal but did not bolus for her cheeseburger later in the evening ?Overnight she took a  total of about 5 units and correction doses late at night but blood sugars were still mostly over 200 until about 3-4 AM when the started improving ?Glucose was as low as 63 this morning at 10 AM and subsequently has been only as much is 140 after breakfast which was only a fruit cup with coffee and creamer; again she took 20 units bolus despite a small meal ?She is supposed to continue 60 mg prednisone a day at for a total of 1 week, started 3 days ago ?She did start using insulin from her KwikPen but is going to start using the insulin vial from today ?      ?Side effects from medications have been: Diarhea with regular metformin, frequent candidiasis from [place], nausea from [place] ? ?Mealtimes are 10 AM, 2 PM and 6 PM, may skip breakfast ? ?PREVIOUS data: ? ?CGM use % of time 79  ?2-week average/GV 179+/-69  ?Time in range 49     %  ?% Time Above 180 33  ?% Time above 250 15  ?% Time Below 70 2  ? ?  ?PRE-MEAL Fasting Lunch Dinner Bedtime Overall  ?Glucose range:       ?Averages: 137 157 186 224   ? ?Previously: ? ? ?CGM use % of time 93  ?2-week average/GV 147/56  ?Time in range    64    %  ?%  Time Above 180 26  ?% Time above 250 4  ?% Time Below 70 4  ? ?Previous: ? ?CGM use % of time 88  ?2-week average/GV 171+/-53  ?Time in range       55% was 52  ?% Time Above 180 43  ?% Time above 250 5  ?% Time Below 70 1.9  ? ? ? ?Self-care: The diet that the patient has been following is: tries to limit high-fat foods and drinks with sugar .     ?Typical meal intake: Breakfast is sometimes cereal/bagel/granola Bar.  Usually eating breakfast only if she is working ? ?Lunch usually yogurt, cheese, fruit and crackers.  Dinner chicken with vegetables.  She will have snacks with granola bar or popcorn     ?           ?Dietician visit, most recent: Several years ago in class ?              ? ?Weight history: ? ?Wt Readings from Last 3 Encounters:  ?05/04/21 188 lb 12.8 oz (85.6 kg)  ?03/30/21 188 lb (85.3 kg)  ?12/08/20  180 lb (81.6 kg)  ? ? ?Glycemic control: ?  ?Lab Results  ?Component Value Date  ? HGBA1C 7.0 (A) 03/30/2021  ? HGBA1C 6.8 (H) 11/22/2020  ? HGBA1C 7.2 (H) 08/08/2020  ? ?Lab Results  ?Component Value Date  ? MICROALBUR 10.7 (H) 08/08/2020  ? LDLCALC 44 03/27/2021  ? CREATININE 0.60 05/01/2021  ? ?Lab Results  ?Component Value Date  ? MICRALBCREAT 6.6 08/08/2020  ? ? ?Lab Results  ?Component Value Date  ? FRUCTOSAMINE 250 03/27/2021  ? FRUCTOSAMINE 278 10/31/2018  ? FRUCTOSAMINE 265 07/18/2018  ? ? ? ? ?Allergies as of 05/04/2021   ? ?   Reactions  ? Wilder Glade [dapagliflozin] Other (See Comments)  ? YEAST INFECTION  ? Januvia [sitagliptin] Diarrhea, Nausea And Vomiting  ? Lisinopril Cough  ? Tanzeum [albiglutide] Nausea And Vomiting  ? Trulicity [dulaglutide] Nausea Only  ? Adhesive [tape] Rash  ? Sulfonamide Derivatives Rash  ? ?  ? ?  ?Medication List  ?  ? ?  ? Accurate as of May 04, 2021  1:16 PM. If you have any questions, ask your nurse or doctor.  ?  ?  ? ?  ? ?Albuterol Sulfate 108 (90 Base) MCG/ACT Aepb ?Commonly known as: PROAIR RESPICLICK ?Inhale 2 puffs into the lungs as needed (shortness of breath). ?  ?budesonide-formoterol 160-4.5 MCG/ACT inhaler ?Commonly known as: SYMBICORT ?Inhale 2 puffs into the lungs 2 (two) times daily as needed (asthma). ?  ?cyanocobalamin 1000 MCG/ML injection ?Commonly known as: (VITAMIN B-12) ?  ?Dexcom G6 Receiver Devi ?Use to check blood sugars. ?  ?Dexcom G6 Sensor Misc ?USE TO CHECK BLOOD SUGAR ?  ?Dexcom G6 Transmitter Misc ?USE TO CHECK BLOOD SUGAR ?  ?ezetimibe 10 MG tablet ?Commonly known as: ZETIA ?TAKE (1) TABLET BY MOUTH ONCE DAILY. ?  ?fenofibrate 145 MG tablet ?Commonly known as: TRICOR ?TAKE (1) TABLET BY MOUTH ONCE DAILY. ?  ?glucose blood test strip ?use 1 strip as directed ?  ?glucose blood test strip ?Commonly known as: ONE TOUCH ULTRA TEST ?Use as instructed to test 3 times daily ?  ?HumuLIN R U-500 KwikPen 500 UNIT/ML KwikPen ?Generic drug: insulin regular  human CONCENTRATED ?INJECT 80-110 UNITS BEFORE MEALS AND AT BEDTIME. ADJUST AS DIRECTED ?  ?insulin regular human CONCENTRATED 500 UNIT/ML injection ?Commonly known as: HUMULIN R ?Use 77m into pod every 3 days ?  ?  hydroxypropyl methylcellulose / hypromellose 2.5 % ophthalmic solution ?Commonly known as: ISOPTO TEARS / GONIOVISC ?Place 1 drop into the left eye 3 (three) times daily. ?  ?Insulin Pen Needle 31G X 5 MM Misc ?Use for insulin and Victoza ?  ?Iron-Vitamin C 100-250 MG Tabs ?  ?Magnesium 250 MG Tabs ?Take 250 mg by mouth daily. ?  ?metoprolol tartrate 50 MG tablet ?Commonly known as: LOPRESSOR ?TAKE (1) TABLET BY MOUTH TWICE DAILY. ?  ?NovoLOG FlexPen 100 UNIT/ML FlexPen ?Generic drug: insulin aspart ?INJECT 30 TO 50 UNITS BEFORE DINNERTIME FOR HIGH CARBOHYDRATE MEALS. ?  ?omeprazole 20 MG capsule ?Commonly known as: PRILOSEC ?Take 20 mg by mouth 2 (two) times daily before a meal. ?  ?Omnipod 5 G6 Pod (Gen 5) Misc ?1 Device by Does not apply route every 3 (three) days. ?  ?predniSONE 20 MG tablet ?Commonly known as: DELTASONE ?Take 3 tablets (60 mg total) by mouth daily for 7 days. ?  ?rosuvastatin 20 MG tablet ?Commonly known as: Crestor ?Take 1 tablet (20 mg total) by mouth daily. ?  ?tirzepatide 5 MG/0.5ML Pen ?Commonly known as: MOUNJARO ?Inject 5 mg into the skin once a week. ?  ?Toujeo Max SoloStar 300 UNIT/ML Solostar Pen ?Generic drug: insulin glargine (2 Unit Dial) ?Inject 110 Units into the skin daily. ?  ?Toujeo SoloStar 300 UNIT/ML Solostar Pen ?Generic drug: insulin glargine (1 Unit Dial) ?INJECT 120 UNITS INTO THE SKIN DAILY ?  ?valACYclovir 1000 MG tablet ?Commonly known as: VALTREX ?Take 1 tablet (1,000 mg total) by mouth 3 (three) times daily for 7 days. ?  ?valsartan 320 MG tablet ?Commonly known as: DIOVAN ?Take 320 mg by mouth daily. ?  ?venlafaxine XR 150 MG 24 hr capsule ?Commonly known as: EFFEXOR-XR ?Take 150 mg by mouth daily with breakfast. ?  ?vitamin C 500 MG tablet ?Commonly  known as: ASCORBIC ACID ?Take 500 mg by mouth daily. ?  ?Vitamin D (Cholecalciferol) 25 MCG (1000 UT) Caps ?Take 1,000 Units by mouth daily. ?  ? ?  ? ? ?Allergies:  ?Allergies  ?Allergen Reactions  ? Farxi

## 2021-05-11 ENCOUNTER — Ambulatory Visit: Payer: BC Managed Care – PPO | Admitting: Endocrinology

## 2021-05-11 ENCOUNTER — Other Ambulatory Visit: Payer: Self-pay | Admitting: Endocrinology

## 2021-05-15 ENCOUNTER — Ambulatory Visit: Payer: BC Managed Care – PPO | Admitting: Neurology

## 2021-05-15 ENCOUNTER — Encounter: Payer: Self-pay | Admitting: Neurology

## 2021-05-15 VITALS — BP 131/86 | HR 106 | Ht 59.0 in | Wt 191.0 lb

## 2021-05-15 DIAGNOSIS — G510 Bell's palsy: Secondary | ICD-10-CM | POA: Diagnosis not present

## 2021-05-15 NOTE — Patient Instructions (Signed)
Continue current medical continue current medication ?Follow-up follow-up with your primary care doctor ?Return as needed ?

## 2021-05-15 NOTE — Progress Notes (Signed)
? ?[facility] ? ?PATIENT: Amy Clarke ?DOB: 21-Jun-1970 ? ?REQUESTING CLINICIAN: Kommor, [place], MD ?HISTORY FROM: Patient  ?REASON FOR VISIT: Bell's Palsy  ? ? ?HISTORICAL ? ?CHIEF COMPLAINT:  ?Chief Complaint  ?Patient presents with  ? New Patient (Initial Visit)  ?  Room 12, alone ?NP/internal ED referral for Bell's Palsy ?States she is doing well, reports some numbness around the face  ?  ? ? ?HISTORY OF PRESENT ILLNESS:  ?This is a 51 year old woman past medical history of asthma, diabetes mellitus, hypertension, hyperlipidemia who is presenting with left facial weakness diagnosed with Bell's palsy on April 3.  Patient reports she woke up with left facial weakness, she could not drink out of a straw, has noted changed in her taste and also left ear pain.  She presented to the ED, initial head CT and MRI brain was negative for any acute stroke.  She was diagnosed with Bell's palsy and treated with Valacyclovir and prednisone for total of 7 days.  Patient completed the treatment course, reported improvement of her symptoms.  She is able to close the left eye, her smile still asymmetric but reported improvement.  She denies any previous history of Bell's palsy. ? ? ?OTHER MEDICAL CONDITIONS: DM, Asthma, HTN, HLD, SVT  ? ? ?REVIEW OF SYSTEMS: Full 14 system review of systems performed and negative with exception of: as noted in the HPI  ? ?ALLERGIES: ?Allergies  ?Allergen Reactions  ? Wilder Glade [Dapagliflozin] Other (See Comments)  ?  YEAST INFECTION ?  ? Januvia [Sitagliptin] Diarrhea and Nausea And Vomiting  ? Lisinopril Cough  ? Tanzeum [Albiglutide] Nausea And Vomiting  ? Trulicity [Dulaglutide] Nausea Only  ? Adhesive [Tape] Rash  ? Sulfonamide Derivatives Rash  ? ? ?HOME MEDICATIONS: ?Outpatient Medications Prior to Visit  ?Medication Sig Dispense Refill  ? Albuterol Sulfate 108 (90 Base) MCG/ACT AEPB Inhale 2 puffs into the lungs as needed (shortness of breath).     ?  budesonide-formoterol (SYMBICORT) 160-4.5 MCG/ACT inhaler Inhale 2 puffs into the lungs 2 (two) times daily as needed (asthma).     ? Continuous Blood Gluc Receiver (DEXCOM G6 RECEIVER) DEVI Use to check blood sugars. 1 each 0  ? Continuous Blood Gluc Sensor (DEXCOM G6 SENSOR) MISC USE TO CHECK BLOOD SUGAR 3 each 2  ? Continuous Blood Gluc Transmit (DEXCOM G6 TRANSMITTER) MISC USE TO CHECK BLOOD SUGAR 1 each 0  ? cyanocobalamin (,VITAMIN B-12,) 1000 MCG/ML injection     ? ezetimibe (ZETIA) 10 MG tablet TAKE (1) TABLET BY MOUTH ONCE DAILY. 30 tablet 0  ? fenofibrate (TRICOR) 145 MG tablet TAKE (1) TABLET BY MOUTH ONCE DAILY. 30 tablet 0  ? glucose blood (ONE TOUCH ULTRA TEST) test strip Use as instructed to test 3 times daily 300 each 3  ? glucose blood test strip use 1 strip as directed    ? HUMULIN R U-500 KWIKPEN 500 UNIT/ML KwikPen INJECT 80-110 UNITS BEFORE MEALS AND AT BEDTIME. ADJUST AS DIRECTED 18 mL 0  ? hydroxypropyl methylcellulose / hypromellose (ISOPTO TEARS / GONIOVISC) 2.5 % ophthalmic solution Place 1 drop into the left eye 3 (three) times daily. 15 mL 12  ? Insulin Disposable Pump (OMNIPOD 5 G6 POD, GEN 5,) MISC 1 Device by Does not apply route every 3 (three) days. 2 each 3  ? insulin glargine, 2 Unit Dial, (TOUJEO MAX SOLOSTAR) 300 UNIT/ML Solostar Pen Inject 110 Units into the skin daily. 18 mL 1  ? Insulin Pen Needle 31G X  5 MM MISC Use for insulin and Victoza 200 each 1  ? insulin regular human CONCENTRATED (HUMULIN R) 500 UNIT/ML injection Use 35m into pod every 3 days 20 mL 3  ? metoprolol tartrate (LOPRESSOR) 50 MG tablet TAKE (1) TABLET BY MOUTH TWICE DAILY. 180 tablet 3  ? Multiple Vitamins-Minerals (MULTIVITAMIN WITH MINERALS) tablet Take 1 tablet by mouth daily.    ? NOVOLOG FLEXPEN 100 UNIT/ML FlexPen INJECT 30 TO 50 UNITS BEFORE DINNERTIME FOR HIGH CARBOHYDRATE MEALS. 15 mL 0  ? omeprazole (PRILOSEC) 20 MG capsule Take 20 mg by mouth 2 (two) times daily before a meal.    ? rosuvastatin  (CRESTOR) 20 MG tablet Take 1 tablet (20 mg total) by mouth daily. 90 tablet 3  ? TOUJEO SOLOSTAR 300 UNIT/ML Solostar Pen INJECT 120 UNITS INTO THE SKIN DAILY 9 mL 0  ? valsartan (DIOVAN) 320 MG tablet Take 320 mg by mouth daily.     ? venlafaxine XR (EFFEXOR-XR) 150 MG 24 hr capsule Take 150 mg by mouth daily with breakfast.    ? Vitamin D, Cholecalciferol, 1000 units CAPS Take 1,000 Units by mouth daily.    ? Iron-Vitamin C 100-250 MG TABS     ? Magnesium 250 MG TABS Take 250 mg by mouth daily.    ? tirzepatide (MOUNJARO) 5 MG/0.5ML Pen Inject 5 mg into the skin once a week. 2 mL 1  ? vitamin C (ASCORBIC ACID) 500 MG tablet Take 500 mg by mouth daily.    ? ?No facility-administered medications prior to visit.  ? ? ?PAST MEDICAL HISTORY: ?Past Medical History:  ?Diagnosis Date  ? Allergy   ? seasonal and environmental  ? Anemia 2011  ? before hysterectemy  ? Asthma   ? Complication of anesthesia   ? hard time waking up  ? Depression   ? Diabetes ([place]   ? Dysrhythmia   ? father died at age 789due to MI, heart cath in 10/2018, no intervention  ? GERD (gastroesophageal reflux disease)   ? History of kidney stones   ? Hyperlipidemia   ? Hypertension   ? Kidney cysts   ? Kidney stones   ? Migraine headache   ? PONV (postoperative nausea and vomiting)   ? ? ?PAST SURGICAL HISTORY: ?Past Surgical History:  ?Procedure Laterality Date  ? ABDOMINAL HYSTERECTOMY    ? CARDIAC CATHETERIZATION    ? CESAREAN SECTION    ? x2  ? CHOLECYSTECTOMY    ? LEFT HEART CATH AND CORONARY ANGIOGRAPHY N/A 10/29/2017  ? Procedure: LEFT HEART CATH AND CORONARY ANGIOGRAPHY;  Surgeon: JMartinique Peter M, MD;  [facility];  Service: Cardiovascular;  Laterality: N/A;  ? LITHOTRIPSY    ? x3  ? NASAL SEPTOPLASTY W/ TURBINOPLASTY Bilateral 11/04/2019  ? Procedure: NASAL SEPTOPLASTY WITH TURBINATE REDUCTION;  Surgeon: SJerrell Belfast MD;  [facility]  Service: ENT;  Laterality: Bilateral;  ? none    ?  TONSILLECTOMY    ? ? ?FAMILY HISTORY: ?Family History  ?Problem Relation Age of Onset  ? Diabetes Mother   ? Diabetes Maternal Grandmother   ? Thyroid disease Cousin   ? Colon cancer Neg Hx   ? Colon polyps Neg Hx   ? Esophageal cancer Neg Hx   ? Rectal cancer Neg Hx   ? Stomach cancer Neg Hx   ? ? ?SOCIAL HISTORY: ?Social History  ? ?Socioeconomic History  ? Marital status: Married  ?  Spouse name: Not  on file  ? Number of children: 4  ? Years of education: Not on file  ? Highest education level: Not on file  ?Occupational History  ? Occupation: unemployed  ?Tobacco Use  ? Smoking status: Never  ? Smokeless tobacco: Never  ?Vaping Use  ? Vaping Use: Never used  ?Substance and Sexual Activity  ? Alcohol use: Yes  ?  Comment: social, less than weekly  ? Drug use: Never  ? Sexual activity: Not on file  ?Other Topics Concern  ? Not on file  ?Social History Narrative  ? Not on file  ? ?Social Determinants of Health  ? ?Financial Resource Strain: Not on file  ?Food Insecurity: Not on file  ?Transportation Needs: Not on file  ?Physical Activity: Not on file  ?Stress: Not on file  ?Social Connections: Not on file  ?Intimate Partner Violence: Not on file  ? ? ?PHYSICAL EXAM ? ?GENERAL EXAM/CONSTITUTIONAL: ?Vitals:  ?Vitals:  ? 05/15/21 1405  ?BP: 131/86  ?Pulse: (!) 106  ?Weight: 191 lb (86.6 kg)  ?Height: '4\' 11"'$  (1.499 m)  ? ?Body mass index is 38.58 kg/m?. ?Wt Readings from Last 3 Encounters:  ?05/15/21 191 lb (86.6 kg)  ?05/04/21 188 lb 12.8 oz (85.6 kg)  ?03/30/21 188 lb (85.3 kg)  ? ?Patient is in no distress; well developed, nourished and groomed; neck is supple ? ?EYES: ?Pupils round and reactive to light, Visual fields full to confrontation, Extraocular movements intacts,  ? ?MUSCULOSKELETAL: ?Gait, strength, tone, movements noted in Neurologic exam below ? ?NEUROLOGIC: ?MENTAL STATUS:  ?   ? View : No data to display.  ?  ?  ?  ? ?awake, alert, oriented to person, place and time ?recent and remote memory  intact ?normal attention and concentration ?language fluent, comprehension intact, naming intact ?fund of knowledge appropriate ? ?CRANIAL NERVE:  ?2nd, 3rd, 4th, 6th - pupils equal and reactive to light, visual fields full to confrontatio

## 2021-05-18 ENCOUNTER — Encounter: Payer: Self-pay | Admitting: Endocrinology

## 2021-05-18 ENCOUNTER — Ambulatory Visit: Payer: BC Managed Care – PPO | Admitting: Endocrinology

## 2021-05-18 VITALS — BP 126/82 | HR 133 | Ht 59.0 in | Wt 188.0 lb

## 2021-05-18 DIAGNOSIS — I471 Supraventricular tachycardia: Secondary | ICD-10-CM

## 2021-05-18 DIAGNOSIS — Z794 Long term (current) use of insulin: Secondary | ICD-10-CM

## 2021-05-18 DIAGNOSIS — E1165 Type 2 diabetes mellitus with hyperglycemia: Secondary | ICD-10-CM | POA: Diagnosis not present

## 2021-05-18 NOTE — Progress Notes (Signed)
Patient ID: Amy Clarke, female   DOB: 1971-01-10, 51 y.o.   MRN: [id number] ? ?       ? ? ?Reason for Appointment:  Follow-up for Type 2 Diabetes ? ?Referring physician: Mayra Neer ? ? ?History of Present Illness:  ?        ?Date of diagnosis of type 2 diabetes mellitus: 2003?        ? ?Background history:  ? ?She had gestational diabetes in 1992 and subsequently was on diet alone ?She thinks she was started on diabetes medication about 15 years ago and probably took metformin which she took until about a year ago and this was stopped because of diarrhea.  Records show that she was taking 1000 mg of regular metformin twice a day ?With her PCP she has been on numerous diabetes medications over the years including [place], Trulicity, [place] which apparently all caused side effects, mostly nausea or yeast infections with Vania Rea and [place] ?Her A1c has been mostly higher this year, last year has been as low as 7.2, in January her A1c was 8.4 ? ?INSULIN regimen previously:  Toujeo 60 units in the morning.  Humulin R U-500, 120 units , 2 or 3 times daily before meals ? ?Recent history:  ? ?Non-insulin hypoglycemic drugs the patient is taking are: None ? ?Current management, blood sugar patterns and problems identified: ? ?She started the OMNIPOD insulin pump on 05/03/2021 with the following settings using U-500 insulin ? ?BASAL rates 2 units an hour ?Sensitivity 1: 35, TARGET 12 AM = 120 and 8 AM = 140 with correction threshold 140 ?Carbohydrate ratio 1:1 ? ?Her A1c is last higher at 7.0 ? ? ?Current diabetes management and problems identified: ? ?Her OmniPod 5 insulin pump was started in the manual mode because of being on steroids earlier this month  ?However now she is in the automated mode since she is off the steroids  ?It appears that her blood sugars are overall generally lower and averaging only 140 in the last 2 weeks compared to about 180 previously  ?She had some difficulty getting her  supplies and did not use her pump consistently ?As a result her automated mode has been active only 59% of the time  ?HYPERGLYCEMIA is occurring fairly consistently late evening after about 8 PM  ?This is most likely related to missed or inadequate boluses for meals and snacks ?On 4/18 she did not take any boluses at all ?Otherwise has been taking 15-30 units for meals  ?Today she had coffee with creamer and some cheese and did not bolus before eating but only afterwards ?BASAL insulin requirement recently is between about 23-32 units  ?HYPOGLYCEMIA has occurred periodically in the morning hours but transiently and usually lowest blood sugars are about 6-8 AM ?However HIGHEST blood sugars are around 220 around 11 PM ?      ?Side effects from medications have been: Diarhea with regular metformin, frequent candidiasis from [place], nausea from [place] ? ?Mealtimes are 10 AM, 2 PM and 6 PM, may skip breakfast ? ?CGM use % of time   ?2-week average/GV 140  ?Time in range     66   %  ?% Time Above 180 22  ?% Time above 250 4  ?% Time Below 70 8  ? ?  ? ?PREVIOUS data: ? ?CGM use % of time 79  ?2-week average/GV 179+/-69  ?Time in range 49     %  ?% Time Above 180  33  ?% Time above 250 15  ?% Time Below 70 2  ? ?  ?PRE-MEAL Fasting Lunch Dinner Bedtime Overall  ?Glucose range:       ?Averages: 137 157 186 224   ? ? ? ?Self-care: The diet that the patient has been following is: tries to limit high-fat foods and drinks with sugar .     ?Typical meal intake: Breakfast is sometimes cereal/bagel/granola Bar.  Usually eating breakfast only if she is working ? ?Lunch usually yogurt, cheese, fruit and crackers.  Dinner chicken with vegetables.  She will have snacks with granola bar or popcorn     ?           ? ?Weight history: ? ?Wt Readings from Last 3 Encounters:  ?05/18/21 188 lb (85.3 kg)  ?05/15/21 191 lb (86.6 kg)  ?05/04/21 188 lb 12.8 oz (85.6 kg)  ? ? ?Glycemic control: ?  ?Lab Results  ?Component Value Date  ? HGBA1C  7.0 (A) 03/30/2021  ? HGBA1C 6.8 (H) 11/22/2020  ? HGBA1C 7.2 (H) 08/08/2020  ? ?Lab Results  ?Component Value Date  ? MICROALBUR 10.7 (H) 08/08/2020  ? LDLCALC 44 03/27/2021  ? CREATININE 0.60 05/01/2021  ? ?Lab Results  ?Component Value Date  ? MICRALBCREAT 6.6 08/08/2020  ? ? ?Lab Results  ?Component Value Date  ? FRUCTOSAMINE 250 03/27/2021  ? FRUCTOSAMINE 278 10/31/2018  ? FRUCTOSAMINE 265 07/18/2018  ? ? ? ? ?Allergies as of 05/18/2021   ? ?   Reactions  ? Wilder Glade [dapagliflozin] Other (See Comments)  ? YEAST INFECTION  ? Januvia [sitagliptin] Diarrhea, Nausea And Vomiting  ? Lisinopril Cough  ? Tanzeum [albiglutide] Nausea And Vomiting  ? Trulicity [dulaglutide] Nausea Only  ? Adhesive [tape] Rash  ? Sulfonamide Derivatives Rash  ? ?  ? ?  ?Medication List  ?  ? ?  ? Accurate as of May 18, 2021  4:15 PM. If you have any questions, ask your nurse or doctor.  ?  ?  ? ?  ? ?Albuterol Sulfate 108 (90 Base) MCG/ACT Aepb ?Commonly known as: PROAIR RESPICLICK ?Inhale 2 puffs into the lungs as needed (shortness of breath). ?  ?budesonide-formoterol 160-4.5 MCG/ACT inhaler ?Commonly known as: SYMBICORT ?Inhale 2 puffs into the lungs 2 (two) times daily as needed (asthma). ?  ?cyanocobalamin 1000 MCG/ML injection ?Commonly known as: (VITAMIN B-12) ?  ?Dexcom G6 Receiver Devi ?Use to check blood sugars. ?  ?Dexcom G6 Sensor Misc ?USE TO CHECK BLOOD SUGAR ?  ?Dexcom G6 Transmitter Misc ?USE TO CHECK BLOOD SUGAR ?  ?ezetimibe 10 MG tablet ?Commonly known as: ZETIA ?TAKE (1) TABLET BY MOUTH ONCE DAILY. ?  ?fenofibrate 145 MG tablet ?Commonly known as: TRICOR ?TAKE (1) TABLET BY MOUTH ONCE DAILY. ?  ?glucose blood test strip ?use 1 strip as directed ?  ?glucose blood test strip ?Commonly known as: ONE TOUCH ULTRA TEST ?Use as instructed to test 3 times daily ?  ?HumuLIN R U-500 KwikPen 500 UNIT/ML KwikPen ?Generic drug: insulin regular human CONCENTRATED ?INJECT 80-110 UNITS BEFORE MEALS AND AT BEDTIME. ADJUST AS  DIRECTED ?  ?insulin regular human CONCENTRATED 500 UNIT/ML injection ?Commonly known as: HUMULIN R ?Use 70m into pod every 3 days ?  ?hydroxypropyl methylcellulose / hypromellose 2.5 % ophthalmic solution ?Commonly known as: ISOPTO TEARS / GONIOVISC ?Place 1 drop into the left eye 3 (three) times daily. ?  ?Insulin Pen Needle 31G X 5 MM Misc ?Use for insulin and Victoza ?  ?metoprolol  tartrate 50 MG tablet ?Commonly known as: LOPRESSOR ?TAKE (1) TABLET BY MOUTH TWICE DAILY. ?  ?multivitamin with minerals tablet ?Take 1 tablet by mouth daily. ?  ?NovoLOG FlexPen 100 UNIT/ML FlexPen ?Generic drug: insulin aspart ?INJECT 30 TO 50 UNITS BEFORE DINNERTIME FOR HIGH CARBOHYDRATE MEALS. ?  ?omeprazole 20 MG capsule ?Commonly known as: PRILOSEC ?Take 20 mg by mouth 2 (two) times daily before a meal. ?  ?Omnipod 5 G6 Pod (Gen 5) Misc ?1 Device by Does not apply route every 3 (three) days. ?  ?rosuvastatin 20 MG tablet ?Commonly known as: Crestor ?Take 1 tablet (20 mg total) by mouth daily. ?  ?Toujeo Max SoloStar 300 UNIT/ML Solostar Pen ?Generic drug: insulin glargine (2 Unit Dial) ?Inject 110 Units into the skin daily. ?  ?Toujeo SoloStar 300 UNIT/ML Solostar Pen ?Generic drug: insulin glargine (1 Unit Dial) ?INJECT 120 UNITS INTO THE SKIN DAILY ?  ?valsartan 320 MG tablet ?Commonly known as: DIOVAN ?Take 320 mg by mouth daily. ?  ?venlafaxine XR 150 MG 24 hr capsule ?Commonly known as: EFFEXOR-XR ?Take 150 mg by mouth daily with breakfast. ?  ?Vitamin D (Cholecalciferol) 25 MCG (1000 UT) Caps ?Take 1,000 Units by mouth daily. ?  ? ?  ? ? ?Allergies:  ?Allergies  ?Allergen Reactions  ? Wilder Glade [Dapagliflozin] Other (See Comments)  ?  YEAST INFECTION ?  ? Januvia [Sitagliptin] Diarrhea and Nausea And Vomiting  ? Lisinopril Cough  ? Tanzeum [Albiglutide] Nausea And Vomiting  ? Trulicity [Dulaglutide] Nausea Only  ? Adhesive [Tape] Rash  ? Sulfonamide Derivatives Rash  ? ? ?Past Medical History:  ?Diagnosis Date  ? Allergy    ? seasonal and environmental  ? Anemia 2011  ? before hysterectemy  ? Asthma   ? Complication of anesthesia   ? hard time waking up  ? Depression   ? Diabetes ([place])   ? Dysrhythmia   ? father died at age 58 due t

## 2021-05-25 ENCOUNTER — Other Ambulatory Visit: Payer: Self-pay | Admitting: Endocrinology

## 2021-06-12 ENCOUNTER — Other Ambulatory Visit: Payer: Self-pay | Admitting: Endocrinology

## 2021-06-12 ENCOUNTER — Other Ambulatory Visit: Payer: Self-pay | Admitting: Cardiovascular Disease

## 2021-06-12 DIAGNOSIS — E1165 Type 2 diabetes mellitus with hyperglycemia: Secondary | ICD-10-CM

## 2021-07-05 NOTE — Progress Notes (Signed)
Date:  07/10/2021    [name] Date of Birth: 04/08/1970 Medical Record #[id number]  PCP:  Mayra Neer, MD [facility]  Cardiologist:  Gillian Shields     History of Present Illness: Amy Clarke is a 51 y.o. female with history of chest pain    She was seen here back in 2017 for tachycardia and cardiovascular risk factors which include DM, HTN and HLD. EKG with narrow complex tachycardia vs long RP tachycardia.   Echo with normal EF. Other issues include asthma. FH + for father having early CAD with an MI in his 46's.    SSCP seen by PA October 2019 subsequent cath with no significant CAD per Dr Martinique with normal EF and normal LVEDP  She is on insulin as she had multiple side effects mostly GI/Yest infections with oral meds  A1c better but still high 7.0 03/30/21 Sees Dr Dwyane Dee   3 of her 4 sons are home [place] all went to [place]   Monitor 01/28/19 showed no arrhythmia with average HR 92 bpm Only one run of atrial tachycardia 8 beats  Normal circadian drop at night lowest HR 63 bpm   Echo 12/18/18 EF 60-65% no valve disease reviewed  Has 4 dogs including a [place] Sheep dog all from the shelter  Seen in ED and by neuro for left facial numbness 05/15/21  CT/MRI negative Bell's palsy diagnosed and Rx with Valacyclovir and prednisone Still with asymmetric smile but much improved   Youngest son is home with her Oldest also there and has some behavioral health issues and can be violent Husband traveling less to help out   Past Medical History:  Diagnosis Date   Allergy    seasonal and environmental   Anemia 2011   before hysterectemy   Asthma    Complication of anesthesia    hard time waking up   Depression    Diabetes [facility])    Dysrhythmia    father died at age 40 due to MI, heart cath in 10/2018, no intervention   GERD (gastroesophageal reflux disease)    History of kidney stones    Hyperlipidemia    Hypertension    Kidney cysts    Kidney stones     Migraine headache    PONV (postoperative nausea and vomiting)     Past Surgical History:  Procedure Laterality Date   ABDOMINAL HYSTERECTOMY     CARDIAC CATHETERIZATION     CESAREAN SECTION     x2   CHOLECYSTECTOMY     LEFT HEART CATH AND CORONARY ANGIOGRAPHY N/A 10/29/2017   Procedure: LEFT HEART CATH AND CORONARY ANGIOGRAPHY;  Surgeon: Martinique, Renny Remer M, MD;  Location: [place] [facility];  Service: Cardiovascular;  Laterality: N/A;   LITHOTRIPSY     x3   NASAL SEPTOPLASTY W/ TURBINOPLASTY Bilateral 11/04/2019   Procedure: NASAL SEPTOPLASTY WITH TURBINATE REDUCTION;  Surgeon: Jerrell Belfast, MD;  Location: [place];  Service: ENT;  Laterality: Bilateral;   none     TONSILLECTOMY       Medications: Current Meds  Medication Sig   Albuterol Sulfate 108 (90 Base) MCG/ACT AEPB Inhale 2 puffs into the lungs as needed (shortness of breath).    budesonide-formoterol (SYMBICORT) 160-4.5 MCG/ACT inhaler Inhale 2 puffs into the lungs 2 (two) times daily as needed (asthma).    Continuous Blood Gluc Receiver (DEXCOM G6 RECEIVER) DEVI Use to check blood sugars.   Continuous Blood Gluc Transmit (DEXCOM  G6 TRANSMITTER) MISC USE TO CHECK BLOOD SUGAR   cyanocobalamin (,VITAMIN B-12,) 1000 MCG/ML injection    ezetimibe (ZETIA) 10 MG tablet TAKE (1) TABLET BY MOUTH ONCE DAILY.   fenofibrate (TRICOR) 145 MG tablet TAKE (1) TABLET BY MOUTH ONCE DAILY.   glucose blood (ONE TOUCH ULTRA TEST) test strip Use as instructed to test 3 times daily   HUMULIN R U-500 KWIKPEN 500 UNIT/ML KwikPen INJECT 80-110 UNITS BEFORE MEALS AND AT BEDTIME. ADJUST AS DIRECTED   Insulin Disposable Pump (OMNIPOD 5 G6 POD, GEN 5,) MISC 1 Device by Does not apply route every 3 (three) days.   Insulin Pen Needle 31G X 5 MM MISC Use for insulin and Victoza   insulin regular human CONCENTRATED (HUMULIN R) 500 UNIT/ML injection Use 22m into pod every 3 days   metoprolol tartrate (LOPRESSOR) 50 MG tablet TAKE (1)  TABLET BY MOUTH TWICE DAILY.   Multiple Vitamins-Minerals (MULTIVITAMIN WITH MINERALS) tablet Take 1 tablet by mouth daily.   NOVOLOG FLEXPEN 100 UNIT/ML FlexPen INJECT 30 TO 50 UNITS BEFORE DINNERTIME FOR HIGH CARBOHYDRATE MEALS.   omeprazole (PRILOSEC) 20 MG capsule Take 20 mg by mouth 2 (two) times daily before a meal.   rosuvastatin (CRESTOR) 20 MG tablet Take 1 tablet (20 mg total) by mouth daily.   valsartan (DIOVAN) 320 MG tablet Take 320 mg by mouth daily.    venlafaxine XR (EFFEXOR-XR) 150 MG 24 hr capsule Take 150 mg by mouth daily with breakfast.   Vitamin D, Cholecalciferol, 1000 units CAPS Take 1,000 Units by mouth daily.     Allergies: Allergies  Allergen Reactions   Farxiga [Dapagliflozin] Other (See Comments)    YEAST INFECTION    Januvia [Sitagliptin] Diarrhea and Nausea And Vomiting   Lisinopril Cough   Tanzeum [Albiglutide] Nausea And Vomiting   Trulicity [Dulaglutide] Nausea Only   Adhesive [Tape] Rash   Sulfonamide Derivatives Rash    Social History: The patient  reports that she has never smoked. She has never used smokeless tobacco. She reports current alcohol use. She reports that she does not use drugs.   Family History: The patient's family history includes Diabetes in her maternal grandmother and mother; Thyroid disease in her cousin.   Review of Systems: Please see the history of present illness.   Otherwise, the review of systems is positive for none.   All other systems are reviewed and negative.   Physical Exam: VS:  BP 100/66   Pulse 87   Ht '4\' 11"'$  (1.499 m)   Wt 187 lb (84.8 kg)   SpO2 97%   BMI 37.77 kg/m  .  BMI Body mass index is 37.77 kg/m.  Wt Readings from Last 3 Encounters:  07/10/21 187 lb (84.8 kg)  05/18/21 188 lb (85.3 kg)  05/15/21 191 lb (86.6 kg)    Affect appropriate Healthy:  appears stated age H45 normal Neck supple with no adenopathy JVP normal no bruits no thyromegaly Lungs clear with no wheezing and good  diaphragmatic motion Heart:  S1/S2 no murmur, no rub, gallop or click PMI normal Abdomen: benighn, BS positve, no tenderness, no AAA no bruit.  No HSM or HJR Distal pulses intact with no bruits No edema Neuro non-focal Skin warm and dry No muscular weakness    LABORATORY DATA:  EKG:   12/10/18 ST rate 111 nonspecific ST changes   Lab Results  Component Value Date   WBC 7.8 05/01/2021   HGB 16.3 (H) 05/01/2021   HCT 48.0 (H) 05/01/2021  PLT 245 05/01/2021   GLUCOSE 245 (H) 05/01/2021   CHOL 115 03/27/2021   TRIG 189.0 (H) 03/27/2021   HDL 32.90 (L) 03/27/2021   LDLDIRECT 137.0 11/22/2020   LDLCALC 44 03/27/2021   ALT 27 05/01/2021   AST 29 05/01/2021   NA 139 05/01/2021   K 4.3 05/01/2021   CL 101 05/01/2021   CREATININE 0.60 05/01/2021   BUN 18 05/01/2021   CO2 27 05/01/2021   TSH 2.30 08/08/2020   INR 1.0 05/01/2021   HGBA1C 7.0 (A) 03/30/2021   MICROALBUR 10.7 (H) 08/08/2020     BNP (last 3 results) No results for input(s): "BNP" in the last 8760 hours.  ProBNP (last 3 results) No results for input(s): "PROBNP" in the last 8760 hours.   Other Studies Reviewed Today:  LEFT HEART CATH AND CORONARY ANGIOGRAPHY 10/2017  Conclusion     The left ventricular systolic function is normal. LV end diastolic pressure is normal. The left ventricular ejection fraction is 55-65% by visual estimate.     1. Normal coronary anatomy 2. Normal LV function 3. Normal LVEDP   Plan: consider alternative causes of chest pain   No indication for antiplatelet therapy at this time.     GXT Study Highlights 03/2016     Blood pressure demonstrated a normal response to exercise. There was no ST segment deviation noted during stress.   Normal exercise treadmill stress test. Normal functional capacity. Normal BP response to exertion.       Echo Study Conclusions 06/2015   - Left ventricle: The cavity size was normal. Systolic function was   normal. The estimated  ejection fraction was in the range of 55%   to 60%. Wall motion was normal; there were no regional wall   motion abnormalities. Left ventricular diastolic function   parameters were normal. - Atrial septum: No defect or patent foramen ovale was identified.   Assessment/Plan:   1. Chest Pain :  Normal cath 10/29/17 observe Normal echo 12/18/18 EF 60-65% Discussed utility of calcium score to further risk stratify for CAD given strong family history on biological fathers side  2. Tachycardia:  Improved on beta blocker normal nightly circadian drop   3. DM - uncontrolled by A1C - per Dr Dwyane Dee    4. HLD - on crestor labs with primary    5. Obesity - CV risk factor modification encouraged.    6. Significant situational stress. This may be the culprit of her symptoms.   7. Bell's Palsy:  post Rx and neuro f/u improved    Coronary calcium score   Current medicines are reviewed with the patient today.  The patient does not have concerns regarding medicines other than what has been noted above.  The following changes have been made:  See above.  Labs/ tests ordered today include:  None   No orders of the defined types were placed in this encounter.   Time: spent reviewing cath, monitor echo direct patient interview and composing note 20 minutes  Disposition:   FU with [place] in a year    Signed: Jenkins Rouge, MD  07/10/2021 8:23 AM  [facility] [address] [place], [place]  [address] Phone: [phone] Fax: [phone]

## 2021-07-06 ENCOUNTER — Other Ambulatory Visit: Payer: Self-pay | Admitting: Endocrinology

## 2021-07-06 DIAGNOSIS — E1165 Type 2 diabetes mellitus with hyperglycemia: Secondary | ICD-10-CM

## 2021-07-10 ENCOUNTER — Encounter: Payer: Self-pay | Admitting: Cardiovascular Disease

## 2021-07-10 ENCOUNTER — Ambulatory Visit: Payer: BC Managed Care – PPO | Admitting: Cardiovascular Disease

## 2021-07-10 VITALS — BP 100/66 | HR 87 | Ht 59.0 in | Wt 187.0 lb

## 2021-07-10 DIAGNOSIS — R079 Chest pain, unspecified: Secondary | ICD-10-CM | POA: Diagnosis not present

## 2021-07-10 DIAGNOSIS — E782 Mixed hyperlipidemia: Secondary | ICD-10-CM

## 2021-07-10 NOTE — Patient Instructions (Signed)
Medication Instructions:  Your physician recommends that you continue on your current medications as directed. Please refer to the Current Medication list given to you today.  *If you need a refill on your cardiac medications before your next appointment, please call your pharmacy*  Lab Work: If you have labs (blood work) drawn today and your tests are completely normal, you will receive your results only by: [place] (if you have MyChart) OR A paper copy in the mail If you have any lab test that is abnormal or we need to change your treatment, we will call you to review the results.  Testing/Procedures: Cardiac CT scanning for calcium score, (CAT scanning), is a noninvasive, special x-ray that produces cross-sectional images of the body using x-rays and a computer. CT scans help physicians diagnose and treat medical conditions. For some CT exams, a contrast material is used to enhance visibility in the area of the body being studied. CT scans provide greater clarity and reveal more details than regular x-ray exams.  Follow-Up: At [facility], you and your health needs are our priority.  As part of our continuing mission to provide you with exceptional heart care, we have created designated Provider Care Teams.  These Care Teams include your primary Cardiologist (physician) and Advanced Practice Providers (APPs -  Physician Assistants and Nurse Practitioners) who all work together to provide you with the care you need, when you need it.  We recommend signing up for the patient portal called "MyChart".  Sign up information is provided on this After Visit Summary.  MyChart is used to connect with patients for Virtual Visits (Telemedicine).  Patients are able to view lab/test results, encounter notes, upcoming appointments, etc.  Non-urgent messages can be sent to your provider as well.   To learn more about what you can do with MyChart, go to [email or web address].    Your next  appointment:   1 year(s)  The format for your next appointment:   In Person  Provider:   Jenkins Rouge, MD {  Important Information About Sugar

## 2021-07-18 ENCOUNTER — Ambulatory Visit
Admission: RE | Admit: 2021-07-18 | Discharge: 2021-07-18 | Disposition: A | Discharge status: Home / Self Care | Payer: Self-pay | Source: Ambulatory Visit | Attending: Cardiovascular Disease | Admitting: Cardiovascular Disease

## 2021-07-18 DIAGNOSIS — E782 Mixed hyperlipidemia: Secondary | ICD-10-CM

## 2021-07-18 DIAGNOSIS — R079 Chest pain, unspecified: Secondary | ICD-10-CM

## 2021-08-15 ENCOUNTER — Other Ambulatory Visit: Payer: BC Managed Care – PPO

## 2021-08-15 DIAGNOSIS — K9049 Malabsorption due to intolerance, not elsewhere classified: Secondary | ICD-10-CM | POA: Diagnosis not present

## 2021-08-15 DIAGNOSIS — R0602 Shortness of breath: Secondary | ICD-10-CM | POA: Diagnosis not present

## 2021-08-15 DIAGNOSIS — Z9189 Other specified personal risk factors, not elsewhere classified: Secondary | ICD-10-CM | POA: Diagnosis not present

## 2021-08-15 DIAGNOSIS — G4719 Other hypersomnia: Secondary | ICD-10-CM | POA: Diagnosis not present

## 2021-08-15 DIAGNOSIS — N951 Menopausal and female climacteric states: Secondary | ICD-10-CM | POA: Diagnosis not present

## 2021-08-15 DIAGNOSIS — F330 Major depressive disorder, recurrent, mild: Secondary | ICD-10-CM | POA: Diagnosis not present

## 2021-08-15 DIAGNOSIS — K760 Fatty (change of) liver, not elsewhere classified: Secondary | ICD-10-CM | POA: Diagnosis not present

## 2021-08-15 DIAGNOSIS — E1122 Type 2 diabetes mellitus with diabetic chronic kidney disease: Secondary | ICD-10-CM | POA: Diagnosis not present

## 2021-08-15 DIAGNOSIS — E669 Obesity, unspecified: Secondary | ICD-10-CM | POA: Diagnosis not present

## 2021-08-15 DIAGNOSIS — E782 Mixed hyperlipidemia: Secondary | ICD-10-CM | POA: Diagnosis not present

## 2021-08-18 ENCOUNTER — Ambulatory Visit: Payer: BC Managed Care – PPO | Admitting: Endocrinology

## 2021-08-19 ENCOUNTER — Other Ambulatory Visit: Payer: Self-pay | Admitting: Endocrinology

## 2021-08-19 DIAGNOSIS — E1165 Type 2 diabetes mellitus with hyperglycemia: Secondary | ICD-10-CM

## 2021-08-22 ENCOUNTER — Other Ambulatory Visit: Payer: Self-pay | Admitting: Cardiovascular Disease

## 2021-08-29 DIAGNOSIS — R632 Polyphagia: Secondary | ICD-10-CM | POA: Diagnosis not present

## 2021-08-29 DIAGNOSIS — R638 Other symptoms and signs concerning food and fluid intake: Secondary | ICD-10-CM | POA: Diagnosis not present

## 2021-08-29 DIAGNOSIS — E781 Pure hyperglyceridemia: Secondary | ICD-10-CM | POA: Diagnosis not present

## 2021-08-29 DIAGNOSIS — E1122 Type 2 diabetes mellitus with diabetic chronic kidney disease: Secondary | ICD-10-CM | POA: Diagnosis not present

## 2021-09-06 ENCOUNTER — Encounter (INDEPENDENT_AMBULATORY_CARE_PROVIDER_SITE_OTHER): Payer: Self-pay

## 2021-09-16 ENCOUNTER — Ambulatory Visit (INDEPENDENT_AMBULATORY_CARE_PROVIDER_SITE_OTHER): Payer: BC Managed Care – PPO

## 2021-09-16 ENCOUNTER — Ambulatory Visit
Admission: EM | Admit: 2021-09-16 | Discharge: 2021-09-16 | Disposition: A | Discharge status: Home / Self Care | Payer: BC Managed Care – PPO | Attending: Nurse Practitioner | Admitting: Nurse Practitioner

## 2021-09-16 DIAGNOSIS — B9689 Other specified bacterial agents as the cause of diseases classified elsewhere: Secondary | ICD-10-CM

## 2021-09-16 DIAGNOSIS — J069 Acute upper respiratory infection, unspecified: Secondary | ICD-10-CM | POA: Diagnosis not present

## 2021-09-16 DIAGNOSIS — R0600 Dyspnea, unspecified: Secondary | ICD-10-CM

## 2021-09-16 DIAGNOSIS — R0602 Shortness of breath: Secondary | ICD-10-CM | POA: Diagnosis not present

## 2021-09-16 MED ORDER — ALBUTEROL SULFATE HFA 108 (90 BASE) MCG/ACT IN AERS: 2.0000 | INHALATION_SPRAY | Freq: Four times a day (QID) | RESPIRATORY_TRACT | 0 refills | Status: AC | PRN

## 2021-09-16 MED ORDER — AMOXICILLIN-POT CLAVULANATE 875-125 MG PO TABS: 1.0000 | ORAL_TABLET | Freq: Two times a day (BID) | ORAL | 0 refills | Status: DC

## 2021-09-16 MED ORDER — ONDANSETRON HCL 4 MG PO TABS: 4.0000 mg | ORAL_TABLET | Freq: Three times a day (TID) | ORAL | 0 refills | Status: AC | PRN

## 2021-09-16 MED ORDER — PSEUDOEPH-BROMPHEN-DM 30-2-10 MG/5ML PO SYRP: 5.0000 mL | ORAL_SOLUTION | Freq: Four times a day (QID) | ORAL | 0 refills | Status: AC | PRN

## 2021-09-16 NOTE — ED Provider Notes (Signed)
[facility]    CSN: [id number] Arrival date & time: 09/16/21  1500      History   Chief Complaint Chief Complaint  Patient presents with   Cough   Vomiting    HPI Amy Clarke is a 51 y.o. female.   The history is provided by the patient.   Patient presents for complaints of shortness of breath, cough, postnasal drainage, and sinus pressure.  She also complains of vomiting that started 1 day ago and sinus pressure for 2 weeks.  Patient denies fever, chills, wheezing, difficulty breathing, or chest pain.   Past Medical History:  Diagnosis Date   Allergy    seasonal and environmental   Anemia 2011   before hysterectemy   Asthma    Complication of anesthesia    hard time waking up   Depression    Diabetes [facility])    Dysrhythmia    father died at age 35 due to MI, heart cath in 10/2018, no intervention   GERD (gastroesophageal reflux disease)    History of kidney stones    Hyperlipidemia    Hypertension    Kidney cysts    Kidney stones    Migraine headache    PONV (postoperative nausea and vomiting)     Patient Active Problem List   Diagnosis Date Noted   Deviated septum 11/04/2019   Normal coronary arteries 10/29/2017   Chest pain    Unstable angina ([place]) 10/28/2017   Mixed hyperlipidemia 09/11/2017   COUGH 08/04/2009   Type 2 diabetes mellitus without complication, without long-term current use of insulin ([place]) 08/03/2009   PURE HYPERCHOLESTEROLEMIA 08/03/2009   Obesity, unspecified 08/03/2009   DEPRESSION 08/03/2009   HYPERTENSION, BENIGN ESSENTIAL 08/03/2009   ALLERGIC RHINITIS DUE TO POLLEN 08/03/2009   ASTHMA 08/03/2009   GERD 08/03/2009    Past Surgical History:  Procedure Laterality Date   ABDOMINAL HYSTERECTOMY     CARDIAC CATHETERIZATION     CESAREAN SECTION     x2   CHOLECYSTECTOMY     LEFT HEART CATH AND CORONARY ANGIOGRAPHY N/A 10/29/2017   Procedure: LEFT HEART CATH AND CORONARY ANGIOGRAPHY;  Surgeon: Martinique, Peter M, MD;   Location: [place] [facility];  Service: Cardiovascular;  Laterality: N/A;   LITHOTRIPSY     x3   NASAL SEPTOPLASTY W/ TURBINOPLASTY Bilateral 11/04/2019   Procedure: NASAL SEPTOPLASTY WITH TURBINATE REDUCTION;  Surgeon: Jerrell Belfast, MD;  Location: [place];  Service: ENT;  Laterality: Bilateral;   none     TONSILLECTOMY      OB History   No obstetric history on file.      Home Medications    Prior to Admission medications   Medication Sig Start Date End Date Taking? Authorizing Provider  Albuterol Sulfate 108 (90 Base) MCG/ACT AEPB Inhale 2 puffs into the lungs as needed (shortness of breath).     [provider]  budesonide-formoterol (SYMBICORT) 160-4.5 MCG/ACT inhaler Inhale 2 puffs into the lungs 2 (two) times daily as needed (asthma).     [provider]  Continuous Blood Gluc Receiver (DEXCOM G6 RECEIVER) DEVI Use to check blood sugars. 10/09/19   Elayne Snare, MD  Continuous Blood Gluc Sensor (DEXCOM G6 SENSOR) MISC USE TO CHECK BLOOD SUGAR 07/10/21   Elayne Snare, MD  Continuous Blood Gluc Transmit (DEXCOM G6 TRANSMITTER) MISC USE TO CHECK BLOOD SUGAR 06/12/21   Elayne Snare, MD  cyanocobalamin (,VITAMIN B-12,) 1000 MCG/ML injection  08/27/19   [provider]  ezetimibe (ZETIA) 10 MG tablet TAKE (1) TABLET BY MOUTH ONCE DAILY. 07/10/21   Elayne Snare, MD  fenofibrate (TRICOR) 145 MG tablet TAKE (1) TABLET BY MOUTH ONCE DAILY. 06/12/21   Elayne Snare, MD  glucose blood (ONE TOUCH ULTRA TEST) test strip Use as instructed to test 3 times daily 09/11/17   Elayne Snare, MD  glucose blood test strip use 1 strip as directed Patient not taking: Reported on 07/10/2021 01/25/17   [provider]  HUMULIN R U-500 KWIKPEN 500 UNIT/ML KwikPen INJECT 80-110 UNITS BEFORE MEALS AND AT BEDTIME. ADJUST AS DIRECTED 04/14/21   Elayne Snare, MD  hydroxypropyl methylcellulose / hypromellose (ISOPTO TEARS / GONIOVISC) 2.5 % ophthalmic solution Place 1 drop  into the left eye 3 (three) times daily. Patient not taking: Reported on 07/10/2021 05/01/21   Kommor, Debe Coder, MD  Insulin Disposable Pump (OMNIPOD 5 G6 POD, GEN 5,) MISC 1 Device by Does not apply route every 3 (three) days. 03/31/21   Elayne Snare, MD  insulin glargine, 2 Unit Dial, (TOUJEO MAX SOLOSTAR) 300 UNIT/ML Solostar Pen Inject 110 Units into the skin daily. Patient not taking: Reported on 07/10/2021 10/10/19   Elayne Snare, MD  Insulin Pen Needle 31G X 5 MM MISC Use for insulin and Victoza 09/03/18   Elayne Snare, MD  insulin regular human CONCENTRATED (HUMULIN R) 500 UNIT/ML injection USE 2ML INTO POD EVERY 3 DAYS. 08/20/21   Elayne Snare, MD  metoprolol tartrate (LOPRESSOR) 50 MG tablet TAKE (1) TABLET BY MOUTH TWICE DAILY. 08/22/21   Josue Hector, MD  Multiple Vitamins-Minerals (MULTIVITAMIN WITH MINERALS) tablet Take 1 tablet by mouth daily.    [provider]  omeprazole (PRILOSEC) 20 MG capsule Take 20 mg by mouth 2 (two) times daily before a meal. 10/26/20   [provider]  rosuvastatin (CRESTOR) 20 MG tablet Take 1 tablet (20 mg total) by mouth daily. 03/30/21   Elayne Snare, MD  TOUJEO SOLOSTAR 300 UNIT/ML Solostar Pen INJECT 120 UNITS INTO THE SKIN DAILY Patient not taking: Reported on 07/10/2021 03/16/21   Elayne Snare, MD  valsartan (DIOVAN) 320 MG tablet Take 320 mg by mouth daily.     [provider]  venlafaxine XR (EFFEXOR-XR) 150 MG 24 hr capsule Take 150 mg by mouth daily with breakfast.    [provider]  Vitamin D, Cholecalciferol, 1000 units CAPS Take 1,000 Units by mouth daily.    [provider]    Family History Family History  Problem Relation Age of Onset   Diabetes Mother    Diabetes Maternal Grandmother    Thyroid disease Cousin    Colon cancer Neg Hx    Colon polyps Neg Hx    Esophageal cancer Neg Hx    Rectal cancer Neg Hx    Stomach cancer Neg Hx     Social History Social History   Tobacco Use   Smoking status:  Never   Smokeless tobacco: Never  Vaping Use   Vaping Use: Never used  Substance Use Topics   Alcohol use: Yes    Comment: social, less than weekly   Drug use: Never     Allergies   Farxiga [dapagliflozin], Januvia [sitagliptin], Lisinopril, Tanzeum [albiglutide], Trulicity [dulaglutide], Adhesive [tape], and Sulfonamide derivatives   Review of Systems Review of Systems   Physical Exam Triage Vital Signs ED Triage Vitals [09/16/21 1516]  Enc Vitals Group     BP 133/88     Pulse Rate 98     Resp  18     Temp 98.1 F (36.7 C)     Temp Source Oral     SpO2 94 %     Weight      Height      Head Circumference      Peak Flow      Pain Score      Pain Loc      Pain Edu?      Excl. in [place]?    No data found.  Updated Vital Signs BP 133/88 (BP Location: Right Arm)   Pulse 98   Temp 98.1 F (36.7 C) (Oral)   Resp 18   SpO2 94%   Visual Acuity Right Eye Distance:   Left Eye Distance:   Bilateral Distance:    Right Eye Near:   Left Eye Near:    Bilateral Near:     Physical Exam   UC Treatments / Results  Labs (all labs ordered are listed, but only abnormal results are displayed) Labs Reviewed - No data to display  EKG   Radiology No results found.  Procedures Procedures (including critical care time)  Medications Ordered in UC Medications - No data to display  Initial Impression / Assessment and Plan / UC Course  I have reviewed the triage vital signs and the nursing notes.  Pertinent labs & imaging results that were available during my care of the patient were reviewed by me and considered in my medical decision making (see chart for details).     *** Final Clinical Impressions(s) / UC Diagnoses   Final diagnoses:  None   Discharge Instructions   None    ED Prescriptions   None    PDMP not reviewed this encounter.

## 2021-09-16 NOTE — ED Triage Notes (Signed)
Pt reports shortness of breath, vomiting x 2 days; cough x 1 week; sinus pressure x 2 weeks. OTC sinus medications gives no relief.

## 2021-09-16 NOTE — Discharge Instructions (Addendum)
Your chest x-ray was negative for pneumonia or other lung abnormality. Take medication as prescribed. Increase fluids and allow for plenty of rest. Recommend Tylenol or ibuprofen as needed for pain, fever, or general discomfort. Warm salt water gargles 3-4 times daily to help with throat pain or discomfort. Recommend using a humidifier at bedtime during sleep to help with cough and nasal congestion. Sleep elevated on 2 pillows while cough symptoms persist. Follow-up with your primary care physician within the next 7 to 10 days for reevaluation. Go to the emergency department if you develop worsening shortness of breath, difficulty breathing, wheezing, or other concerns. Follow-up if your symptoms do not improve.

## 2021-09-19 DIAGNOSIS — F439 Reaction to severe stress, unspecified: Secondary | ICD-10-CM | POA: Diagnosis not present

## 2021-09-19 DIAGNOSIS — E1122 Type 2 diabetes mellitus with diabetic chronic kidney disease: Secondary | ICD-10-CM | POA: Diagnosis not present

## 2021-09-19 DIAGNOSIS — E781 Pure hyperglyceridemia: Secondary | ICD-10-CM | POA: Diagnosis not present

## 2021-09-19 DIAGNOSIS — R632 Polyphagia: Secondary | ICD-10-CM | POA: Diagnosis not present

## 2021-09-28 ENCOUNTER — Other Ambulatory Visit: Payer: Self-pay | Admitting: Endocrinology

## 2021-10-03 ENCOUNTER — Other Ambulatory Visit: Payer: Self-pay | Admitting: Endocrinology

## 2021-10-03 DIAGNOSIS — E1165 Type 2 diabetes mellitus with hyperglycemia: Secondary | ICD-10-CM

## 2021-10-10 DIAGNOSIS — F439 Reaction to severe stress, unspecified: Secondary | ICD-10-CM | POA: Diagnosis not present

## 2021-10-10 DIAGNOSIS — E1122 Type 2 diabetes mellitus with diabetic chronic kidney disease: Secondary | ICD-10-CM | POA: Diagnosis not present

## 2021-10-10 DIAGNOSIS — E669 Obesity, unspecified: Secondary | ICD-10-CM | POA: Diagnosis not present

## 2021-10-10 DIAGNOSIS — R632 Polyphagia: Secondary | ICD-10-CM | POA: Diagnosis not present

## 2021-10-13 DIAGNOSIS — Z1231 Encounter for screening mammogram for malignant neoplasm of breast: Secondary | ICD-10-CM | POA: Diagnosis not present

## 2021-10-23 ENCOUNTER — Other Ambulatory Visit: Payer: Self-pay | Admitting: Endocrinology

## 2021-10-23 DIAGNOSIS — E1165 Type 2 diabetes mellitus with hyperglycemia: Secondary | ICD-10-CM

## 2021-10-31 DIAGNOSIS — E6609 Other obesity due to excess calories: Secondary | ICD-10-CM | POA: Diagnosis not present

## 2021-10-31 DIAGNOSIS — E1122 Type 2 diabetes mellitus with diabetic chronic kidney disease: Secondary | ICD-10-CM | POA: Diagnosis not present

## 2021-10-31 DIAGNOSIS — Z6835 Body mass index (BMI) 35.0-35.9, adult: Secondary | ICD-10-CM | POA: Diagnosis not present

## 2021-11-07 ENCOUNTER — Telehealth: Payer: Self-pay | Admitting: Endocrinology

## 2021-11-07 ENCOUNTER — Other Ambulatory Visit: Payer: Self-pay | Admitting: Endocrinology

## 2021-11-07 DIAGNOSIS — E1165 Type 2 diabetes mellitus with hyperglycemia: Secondary | ICD-10-CM

## 2021-11-07 MED ORDER — DEXCOM G6 SENSOR MISC: 2 refills | Status: DC

## 2021-11-07 NOTE — Telephone Encounter (Signed)
MEDICATION: Sensors for [employer] G6  PHARMACY:  [facility]  HAS THE PATIENT CONTACTED THEIR PHARMACY?  yes  IS THIS A 90 DAY SUPPLY : unknown  IS PATIENT OUT OF MEDICATION: yes  IF NOT; HOW MUCH IS LEFT:   LAST APPOINTMENT DATE: '@4'$ /20/23  NEXT APPOINTMENT DATE:'@1'$ /30/2024  DO WE HAVE YOUR PERMISSION TO LEAVE A DETAILED MESSAGE?:  OTHER COMMENTS:    **Let patient know to contact pharmacy at the end of the day to make sure medication is ready. **  ** Please notify patient to allow 48-72 hours to process**  **Encourage patient to contact the pharmacy for refills or they can request refills through [facility]**

## 2021-11-07 NOTE — Telephone Encounter (Signed)
Rx sent to pharmacy   

## 2021-11-21 DIAGNOSIS — R682 Dry mouth, unspecified: Secondary | ICD-10-CM | POA: Diagnosis not present

## 2021-11-21 DIAGNOSIS — E1165 Type 2 diabetes mellitus with hyperglycemia: Secondary | ICD-10-CM | POA: Diagnosis not present

## 2021-11-21 DIAGNOSIS — K5903 Drug induced constipation: Secondary | ICD-10-CM | POA: Diagnosis not present

## 2021-12-01 ENCOUNTER — Other Ambulatory Visit: Payer: Self-pay | Admitting: Endocrinology

## 2021-12-11 DIAGNOSIS — F418 Other specified anxiety disorders: Secondary | ICD-10-CM | POA: Diagnosis not present

## 2021-12-11 DIAGNOSIS — N182 Chronic kidney disease, stage 2 (mild): Secondary | ICD-10-CM | POA: Diagnosis not present

## 2021-12-11 DIAGNOSIS — E669 Obesity, unspecified: Secondary | ICD-10-CM | POA: Diagnosis not present

## 2021-12-11 DIAGNOSIS — E1022 Type 1 diabetes mellitus with diabetic chronic kidney disease: Secondary | ICD-10-CM | POA: Diagnosis not present

## 2021-12-13 ENCOUNTER — Other Ambulatory Visit: Payer: Self-pay | Admitting: Advanced Practice Midwife

## 2021-12-19 DIAGNOSIS — R3915 Urgency of urination: Secondary | ICD-10-CM | POA: Diagnosis not present

## 2021-12-19 DIAGNOSIS — E1122 Type 2 diabetes mellitus with diabetic chronic kidney disease: Secondary | ICD-10-CM | POA: Diagnosis not present

## 2021-12-25 ENCOUNTER — Other Ambulatory Visit: Payer: Self-pay | Admitting: Endocrinology

## 2022-01-01 DIAGNOSIS — G4710 Hypersomnia, unspecified: Secondary | ICD-10-CM | POA: Diagnosis not present

## 2022-01-01 DIAGNOSIS — E1169 Type 2 diabetes mellitus with other specified complication: Secondary | ICD-10-CM | POA: Diagnosis not present

## 2022-01-01 DIAGNOSIS — Z6833 Body mass index (BMI) 33.0-33.9, adult: Secondary | ICD-10-CM | POA: Diagnosis not present

## 2022-01-01 DIAGNOSIS — E669 Obesity, unspecified: Secondary | ICD-10-CM | POA: Diagnosis not present

## 2022-01-31 DIAGNOSIS — I152 Hypertension secondary to endocrine disorders: Secondary | ICD-10-CM | POA: Diagnosis not present

## 2022-01-31 DIAGNOSIS — Z794 Long term (current) use of insulin: Secondary | ICD-10-CM | POA: Diagnosis not present

## 2022-01-31 DIAGNOSIS — E1169 Type 2 diabetes mellitus with other specified complication: Secondary | ICD-10-CM | POA: Diagnosis not present

## 2022-01-31 DIAGNOSIS — F909 Attention-deficit hyperactivity disorder, unspecified type: Secondary | ICD-10-CM | POA: Diagnosis not present

## 2022-02-02 ENCOUNTER — Other Ambulatory Visit: Payer: Self-pay | Admitting: Endocrinology

## 2022-02-02 DIAGNOSIS — E1165 Type 2 diabetes mellitus with hyperglycemia: Secondary | ICD-10-CM

## 2022-02-06 ENCOUNTER — Encounter: Payer: Self-pay | Admitting: Obstetrics and Gynecology

## 2022-02-06 ENCOUNTER — Ambulatory Visit: Payer: BC Managed Care – PPO | Admitting: Obstetrics and Gynecology

## 2022-02-06 VITALS — BP 124/80 | Ht 59.5 in | Wt 168.0 lb

## 2022-02-06 DIAGNOSIS — N951 Menopausal and female climacteric states: Secondary | ICD-10-CM | POA: Diagnosis not present

## 2022-02-06 DIAGNOSIS — N898 Other specified noninflammatory disorders of vagina: Secondary | ICD-10-CM | POA: Diagnosis not present

## 2022-02-06 DIAGNOSIS — N952 Postmenopausal atrophic vaginitis: Secondary | ICD-10-CM

## 2022-02-06 DIAGNOSIS — R61 Generalized hyperhidrosis: Secondary | ICD-10-CM

## 2022-02-06 DIAGNOSIS — R232 Flushing: Secondary | ICD-10-CM

## 2022-02-06 DIAGNOSIS — B372 Candidiasis of skin and nail: Secondary | ICD-10-CM

## 2022-02-06 DIAGNOSIS — N941 Unspecified dyspareunia: Secondary | ICD-10-CM

## 2022-02-06 LAB — WET PREP FOR TRICH, YEAST, CLUE

## 2022-02-06 MED ORDER — GABAPENTIN 100 MG PO CAPS: ORAL_CAPSULE | ORAL | 1 refills | Status: DC

## 2022-02-06 MED ORDER — NYSTATIN 100000 UNIT/GM EX CREA: 1.0000 | TOPICAL_CREAM | Freq: Two times a day (BID) | CUTANEOUS | 0 refills | Status: DC

## 2022-02-06 NOTE — Progress Notes (Signed)
GYNECOLOGY  VISIT   HPI: 52 y.o.   Married White or Caucasian Not Hispanic or Latino  female   No obstetric history on file. with No LMP recorded. Patient has had a hysterectomy.   here to discuss hot flashes and hormones   She had a vaginal hysterectomy in ~2013 for AUB. She still has her ovaries.  She started having night sweats and hot flashes a few years ago, they have waxed and waned. They have been bad and consistent for 8-9 months. She is having hot flashes ~3 x a day. She has night sweats, wakes up sweaty, wakes up ~3 x a week. Daytime bother her more.  She has sleep issues, not much worse with the night sweats.  Don't have mood changes.  Sexually active, very dry. She has had burning with lubrication. Has entry dyspareunia, may have some deep dyspareunia.   She is on Effexor for depression, lots of life stresses.   She tried estroven and felt spacey.  She was on gabapentin in the past and didn't felt loopy.    No libido.    Mammogram last fall at [facility]   Prior to her hysterectomy, no h/o cervical dysplasia.   Colonoscopy 11/22, polyps removed, f/u 7 years.   She has seen Cardiology with chest pain. H/O DM (HgbA1C was ~6 with Dr Juleen China), HTN, and elevated cholesterol.  She had a cath in 2019 with no significant CAD.   Working on weight loss with Dr Juleen China, she has lost 21 lbs since August. She is walking a little bit.   Dad died of a MI at 12.   SH: she has 4 kids. Range from 21, 26, 28 and 31. All boys. Estranged from the 7 year old. No grandchildren. She isn't currently working. No ETOH. No tob.  Mom died last year, had Alzheimer's.   GYNECOLOGIC HISTORY: No LMP recorded. Patient has had a hysterectomy. Contraception:hysterectomy Menopausal hormone therapy: postmenopausal        OB History     Gravida  4   Para      Term      Preterm      AB  2   Living  4      SAB  2   IAB      Ectopic      Multiple      Live Births                  Patient Active Problem List   Diagnosis Date Noted   Deviated septum 11/04/2019   Normal coronary arteries 10/29/2017   Chest pain    Unstable angina ([place]) 10/28/2017   Mixed hyperlipidemia 09/11/2017   COUGH 08/04/2009   Type 2 diabetes mellitus without complication, without long-term current use of insulin ([place]) 08/03/2009   PURE HYPERCHOLESTEROLEMIA 08/03/2009   Obesity, unspecified 08/03/2009   DEPRESSION 08/03/2009   HYPERTENSION, BENIGN ESSENTIAL 08/03/2009   ALLERGIC RHINITIS DUE TO POLLEN 08/03/2009   ASTHMA 08/03/2009   GERD 08/03/2009    Past Medical History:  Diagnosis Date   Allergy    seasonal and environmental   Anemia 2011   before hysterectemy   Asthma    Complication of anesthesia    hard time waking up   Depression    Diabetes [facility])    Dysrhythmia    father died at age 42 due to MI, heart cath in 10/2018, no intervention   GERD (gastroesophageal reflux disease)    History of kidney stones  Hyperlipidemia    Hypertension    Kidney cysts    Kidney stones    Migraine headache    PONV (postoperative nausea and vomiting)     Past Surgical History:  Procedure Laterality Date   ABDOMINAL HYSTERECTOMY     CARDIAC CATHETERIZATION     CESAREAN SECTION     x2   CHOLECYSTECTOMY     LEFT HEART CATH AND CORONARY ANGIOGRAPHY N/A 10/29/2017   Procedure: LEFT HEART CATH AND CORONARY ANGIOGRAPHY;  Surgeon: Martinique, Peter M, MD;  Location: [place] [facility];  Service: Cardiovascular;  Laterality: N/A;   LITHOTRIPSY     x3   NASAL SEPTOPLASTY W/ TURBINOPLASTY Bilateral 11/04/2019   Procedure: NASAL SEPTOPLASTY WITH TURBINATE REDUCTION;  Surgeon: Jerrell Belfast, MD;  Location: [place];  Service: ENT;  Laterality: Bilateral;   none     TONSILLECTOMY      Current Outpatient Medications  Medication Sig Dispense Refill   albuterol (VENTOLIN HFA) 108 (90 Base) MCG/ACT inhaler Inhale 2 puffs into the lungs every 6 (six) hours as needed for  wheezing or shortness of breath. 8 g 0   brompheniramine-pseudoephedrine-DM 30-2-10 MG/5ML syrup Take 5 mLs by mouth 4 (four) times daily as needed. 140 mL 0   budesonide-formoterol (SYMBICORT) 160-4.5 MCG/ACT inhaler Inhale 2 puffs into the lungs 2 (two) times daily as needed (asthma).      Continuous Blood Gluc Receiver (DEXCOM G6 RECEIVER) DEVI Use to check blood sugars. 1 each 0   Continuous Blood Gluc Sensor (DEXCOM G6 SENSOR) MISC USE TO CHECK BLOOD SUGAR. CHANGE EVERY 10 DAYS. 3 each 0   Continuous Blood Gluc Transmit (DEXCOM G6 TRANSMITTER) MISC USE TO CHECK BLOOD SUGAR 1 each 3   ezetimibe (ZETIA) 10 MG tablet TAKE (1) TABLET BY MOUTH ONCE DAILY. 30 tablet 0   fenofibrate (TRICOR) 145 MG tablet TAKE (1) TABLET BY MOUTH ONCE DAILY. 90 tablet 3   glucose blood (ONE TOUCH ULTRA TEST) test strip Use as instructed to test 3 times daily 300 each 3   glucose blood test strip      HUMULIN R 500 UNIT/ML injection USE 2ML INTO POD EVERY 3 DAYS. 20 mL 0   Insulin Disposable Pump (OMNIPOD 5 G6 POD, GEN 5,) MISC CHANGE POD EVERY 3 DAYS AS DIRECTED. 10 each 0   insulin glargine, 2 Unit Dial, (TOUJEO MAX SOLOSTAR) 300 UNIT/ML Solostar Pen Inject 110 Units into the skin daily. 18 mL 1   Insulin Pen Needle 31G X 5 MM MISC Use for insulin and Victoza 200 each 1   insulin regular human CONCENTRATED (HUMULIN R U-500 KWIKPEN) 500 UNIT/ML KwikPen INJECT 80-110 UNITS BEFORE MEALS AND AT BEDTIME. ADJUST AS DIRECTED 18 mL 1   metoprolol tartrate (LOPRESSOR) 50 MG tablet TAKE (1) TABLET BY MOUTH TWICE DAILY. 180 tablet 3   Multiple Vitamins-Minerals (MULTIVITAMIN WITH MINERALS) tablet Take 1 tablet by mouth daily.     omeprazole (PRILOSEC) 20 MG capsule Take 20 mg by mouth 2 (two) times daily before a meal.     ondansetron (ZOFRAN) 4 MG tablet Take 1 tablet (4 mg total) by mouth every 8 (eight) hours as needed for nausea or vomiting. 20 tablet 0   OZEMPIC, 0.25 OR 0.5 MG/DOSE, 2 MG/3ML SOPN Inject into the skin.      rosuvastatin (CRESTOR) 20 MG tablet Take 1 tablet (20 mg total) by mouth daily. 90 tablet 3   valsartan (DIOVAN) 320 MG tablet Take 320 mg by mouth  daily.      venlafaxine XR (EFFEXOR-XR) 150 MG 24 hr capsule Take 150 mg by mouth daily with breakfast.     Vitamin D, Cholecalciferol, 1000 units CAPS Take 1,000 Units by mouth daily.     hydroxypropyl methylcellulose / hypromellose (ISOPTO TEARS / GONIOVISC) 2.5 % ophthalmic solution Place 1 drop into the left eye 3 (three) times daily. (Patient not taking: Reported on 02/06/2022) 15 mL 12   TOUJEO SOLOSTAR 300 UNIT/ML Solostar Pen INJECT 120 UNITS INTO THE SKIN DAILY (Patient not taking: Reported on 07/10/2021) 9 mL 0   No current facility-administered medications for this visit.     ALLERGIES: Farxiga [dapagliflozin], Januvia [sitagliptin], Lisinopril, Tanzeum [albiglutide], Trulicity [dulaglutide], Adhesive [tape], and Sulfonamide derivatives  Family History  Problem Relation Age of Onset   Diabetes Mother    Diabetes Maternal Grandmother    Thyroid disease Cousin    Colon cancer Neg Hx    Colon polyps Neg Hx    Esophageal cancer Neg Hx    Rectal cancer Neg Hx    Stomach cancer Neg Hx     Social History   Socioeconomic History   Marital status: Married    Spouse name: Not on file   Number of children: 4   Years of education: Not on file   Highest education level: Not on file  Occupational History   Occupation: unemployed  Tobacco Use   Smoking status: Never   Smokeless tobacco: Never  Vaping Use   Vaping Use: Never used  Substance and Sexual Activity   Alcohol use: Yes    Comment: social, less than weekly   Drug use: Never   Sexual activity: Yes    Birth control/protection: Surgical  Other Topics Concern   Not on file  Social History Narrative   Not on file   Social Determinants of Health   Financial Resource Strain: Not on file  Food Insecurity: Not on file  Transportation Needs: Not on file  Physical Activity:  Not on file  Stress: Not on file  Social Connections: Not on file  Intimate Partner Violence: Not on file    Review of Systems  Constitutional:        Hot flashes    PHYSICAL EXAMINATION:    BP 124/80 (BP Location: Right Arm, Patient Position: Sitting, Cuff Size: Normal)   Ht 4' 11.5" (1.511 m)   Wt 168 lb (76.2 kg)   BMI 33.36 kg/m     General appearance: alert, cooperative and appears stated age  Groin: erythematous rash Pelvic: External genitalia:  no lesions              Urethra:  normal appearing urethra with no masses, tenderness or lesions              Bartholins and Skenes: normal                 Vagina: mildly atrophic appearing vagina with a slight increase in thin white vaginal d/c, no lesions              Cervix: absent              Bimanual Exam:  Uterus:  uterus absent              Adnexa: no mass, fullness, tenderness               Chaperone was present for exam.  1. Hot flashes She has multiple risk factors for heart disease.  We  discussed behavioral changes, avoiding triggers, over the counter options. She is already on effexor. Will try gabapentin, she will check in with me next week to let me know how she is doing.  - gabapentin (NEURONTIN) 100 MG capsule; Take one capsule po qam  Dispense: 30 capsule; Refill: 1 -F/U in one month for an annual exam  2. Night sweats See above, night sweats are currently tolerable  3. Vaginal atrophy Mild Will try lubrication, samples of uberlube given  4. Dyspareunia, female Will try vaginal lubrication -If that doesn't help will start vaginal estrogen  5. Vaginal discharge - WET PREP FOR TRICH, YEAST, CLUE  Over 45 minutes spent in total patient care, including chart review, history, exam and counseling.

## 2022-02-06 NOTE — Patient Instructions (Signed)
Menopause Menopause is the normal time of a woman's life when menstrual periods stop completely. It marks the natural end to a woman's ability to become pregnant. It can be defined as the absence of a menstrual period for 12 months without another medical cause. The transition to menopause (perimenopause) most often happens between the ages of 45 and 55, and can last for many years. During perimenopause, hormone levels change in your body, which can cause symptoms and affect your health. Menopause may increase your risk for: Weakened bones (osteoporosis), which causes fractures. Depression. Hardening and narrowing of the arteries (atherosclerosis), which can cause heart attacks and strokes. What are the causes? This condition is usually caused by a natural change in hormone levels that happens as you get older. The condition may also be caused by changes that are not natural, including: Surgery to remove both ovaries (surgical menopause). Side effects from some medicines, such as chemotherapy used to treat cancer (chemical menopause). What increases the risk? This condition is more likely to start at an earlier age if you have certain medical conditions or have undergone treatments, including: A tumor of the pituitary gland in the brain. A disease that affects the ovaries and hormones. Certain cancer treatments, such as chemotherapy or hormone therapy, or radiation therapy on the pelvis. Heavy smoking and excessive alcohol use. Family history of early menopause. This condition is also more likely to develop earlier in women who are very thin. What are the signs or symptoms? Symptoms of this condition include: Hot flashes. Irregular menstrual periods. Night sweats. Changes in feelings about sex. This could be a decrease in sex drive or an increased discomfort around your sexuality. Vaginal dryness and thinning of the vaginal walls. This may cause painful sex. Dryness of the skin and  development of wrinkles. Headaches. Problems sleeping (insomnia). Mood swings or irritability. Memory problems. Weight gain. Hair growth on the face and chest. Bladder infections or problems with urinating. How is this diagnosed? This condition is diagnosed based on your medical history, a physical exam, your age, your menstrual history, and your symptoms. Hormone tests may also be done. How is this treated? In some cases, no treatment is needed. You and your health care provider should make a decision together about whether treatment is necessary. Treatment will be based on your individual condition and preferences. Treatment for this condition focuses on managing symptoms. Treatment may include: Menopausal hormone therapy (MHT). Medicines to treat specific symptoms or complications. Acupuncture. Vitamin or herbal supplements. Before starting treatment, make sure to let your health care provider know if you have a personal or family history of these conditions: Heart disease. Breast cancer. Blood clots. Diabetes. Osteoporosis. Follow these instructions at home: Lifestyle Do not use any products that contain nicotine or tobacco, such as cigarettes, e-cigarettes, and chewing tobacco. If you need help quitting, ask your health care provider. Get at least 30 minutes of physical activity on 5 or more days each week. Avoid alcoholic and caffeinated beverages, as well as spicy foods. This may help prevent hot flashes. Get 7-8 hours of sleep each night. If you have hot flashes, try: Dressing in layers. Avoiding things that may trigger hot flashes, such as spicy food, warm places, or stress. Taking slow, deep breaths when a hot flash starts. Keeping a fan in your home and office. Find ways to manage stress, such as deep breathing, meditation, or journaling. Consider going to group therapy with other women who are having menopause symptoms. Ask your health care   provider about recommended  group therapy meetings. Eating and drinking  Eat a healthy, balanced diet that contains whole grains, lean protein, low-fat dairy, and plenty of fruits and vegetables. Your health care provider may recommend adding more soy to your diet. Foods that contain soy include tofu, tempeh, and soy milk. Eat plenty of foods that contain calcium and vitamin D for bone health. Items that are rich in calcium include low-fat milk, yogurt, beans, almonds, sardines, broccoli, and kale. Medicines Take over-the-counter and prescription medicines only as told by your health care provider. Talk with your health care provider before starting any herbal supplements. If prescribed, take vitamins and supplements as told by your health care provider. General instructions  Keep track of your menstrual periods, including: When they occur. How heavy they are and how long they last. How much time passes between periods. Keep track of your symptoms, noting when they start, how often you have them, and how long they last. Use vaginal lubricants or moisturizers to help with vaginal dryness and improve comfort during sex. Keep all follow-up visits. This is important. This includes any group therapy or counseling. Contact a health care provider if: You are still having menstrual periods after age 55. You have pain during sex. You have not had a period for 12 months and you develop vaginal bleeding. Get help right away if you have: Severe depression. Excessive vaginal bleeding. Pain when you urinate. A fast or irregular heartbeat (palpitations). Severe headaches. Abdominal pain or severe indigestion. Summary Menopause is a normal time of life when menstrual periods stop completely. It is usually defined as the absence of a menstrual period for 12 months without another medical cause. The transition to menopause (perimenopause) most often happens between the ages of 45 and 55 and can last for several years. Symptoms  can be managed through medicines, lifestyle changes, and complementary therapies such as acupuncture. Eat a balanced diet that is rich in nutrients to promote bone health and heart health and to manage symptoms during menopause. This information is not intended to replace advice given to you by your health care provider. Make sure you discuss any questions you have with your health care provider. Document Revised: 10/16/2019 Document Reviewed: 07/02/2019 Elsevier Patient Education  2023 Elsevier Inc.  

## 2022-02-12 ENCOUNTER — Other Ambulatory Visit: Payer: Self-pay | Admitting: Endocrinology

## 2022-02-27 ENCOUNTER — Ambulatory Visit: Payer: BC Managed Care – PPO | Admitting: Endocrinology

## 2022-02-28 DIAGNOSIS — Z794 Long term (current) use of insulin: Secondary | ICD-10-CM | POA: Diagnosis not present

## 2022-02-28 DIAGNOSIS — I152 Hypertension secondary to endocrine disorders: Secondary | ICD-10-CM | POA: Diagnosis not present

## 2022-02-28 DIAGNOSIS — F909 Attention-deficit hyperactivity disorder, unspecified type: Secondary | ICD-10-CM | POA: Diagnosis not present

## 2022-02-28 DIAGNOSIS — E1169 Type 2 diabetes mellitus with other specified complication: Secondary | ICD-10-CM | POA: Diagnosis not present

## 2022-03-01 NOTE — Progress Notes (Signed)
52 y.o. [id number] Married White or Caucasian Not Hispanic or Latino female here for annual exam and follow up on starting gabapentin. She states that it has helped she is no longer having hot flashes multiple times a day. She is only taking 100 mg q am of gabapentin and it has made a great difference. Symptoms are tolerable.  Vaginal lubricant is helping her dryness.   No bowel issues.  She has long term issues with urinary urgency and frequency. No change.  She has some GSI, no urge incontinence. Tolerable.     No LMP recorded. Patient has had a hysterectomy.          Sexually active: Yes.    The current method of family planning is status post hysterectomy.    Exercising: Yes.     Walking  Smoker:  no  Health Maintenance: Pap:  h/o hysterectomy  History of abnormal Pap:  yes follow up was normal  MMG:  10/12/21 Bi-Rads 2 Benign ([facility]). 01/29/19 density B Bi-rads 1 neg  BMD:   none  Colonoscopy: 12/08/20 f/u 7 years  TDaP:  up to date per patient.  Gardasil: n/a    reports that she has never smoked. She has never used smokeless tobacco. She reports current alcohol use. She reports that she does not use drugs. No current ETOH. 4 grown sons, no grandchildren.   Past Medical History:  Diagnosis Date   Allergy    seasonal and environmental   Anemia 2011   before hysterectemy   Asthma    Complication of anesthesia    hard time waking up   Depression    Diabetes [facility])    Dysrhythmia    father died at age 26 due to MI, heart cath in 10/2018, no intervention   GERD (gastroesophageal reflux disease)    History of kidney stones    Hyperlipidemia    Hypertension    Kidney cysts    Kidney stones    Migraine headache    PONV (postoperative nausea and vomiting)     Past Surgical History:  Procedure Laterality Date   ABDOMINAL HYSTERECTOMY     CARDIAC CATHETERIZATION     CESAREAN SECTION     x2   CHOLECYSTECTOMY     LEFT HEART CATH AND CORONARY ANGIOGRAPHY N/A 10/29/2017    Procedure: LEFT HEART CATH AND CORONARY ANGIOGRAPHY;  Surgeon: Martinique, Peter M, MD;  Location: [facility];  Service: Cardiovascular;  Laterality: N/A;   LITHOTRIPSY     x3   NASAL SEPTOPLASTY W/ TURBINOPLASTY Bilateral 11/04/2019   Procedure: NASAL SEPTOPLASTY WITH TURBINATE REDUCTION;  Surgeon: Jerrell Belfast, MD;  Location: [facility];  Service: ENT;  Laterality: Bilateral;   none     TONSILLECTOMY      Current Outpatient Medications  Medication Sig Dispense Refill   albuterol (VENTOLIN HFA) 108 (90 Base) MCG/ACT inhaler Inhale 2 puffs into the lungs every 6 (six) hours as needed for wheezing or shortness of breath. 8 g 0   budesonide-formoterol (SYMBICORT) 160-4.5 MCG/ACT inhaler Inhale 2 puffs into the lungs 2 (two) times daily as needed (asthma).      Continuous Blood Gluc Receiver (DEXCOM G6 RECEIVER) DEVI Use to check blood sugars. 1 each 0   Continuous Blood Gluc Sensor (DEXCOM G6 SENSOR) MISC USE TO CHECK BLOOD SUGAR. CHANGE EVERY 10 DAYS. 3 each 0   Continuous Blood Gluc Transmit (DEXCOM G6 TRANSMITTER) MISC USE TO CHECK BLOOD SUGAR 1 each 3   ezetimibe (  ZETIA) 10 MG tablet TAKE (1) TABLET BY MOUTH ONCE DAILY. 30 tablet 3   fenofibrate (TRICOR) 145 MG tablet TAKE (1) TABLET BY MOUTH ONCE DAILY. 90 tablet 3   glucose blood (ONE TOUCH ULTRA TEST) test strip Use as instructed to test 3 times daily 300 each 3   glucose blood test strip      HUMULIN R 500 UNIT/ML injection USE 2ML INTO POD EVERY 3 DAYS. 20 mL 0   Insulin Disposable Pump (OMNIPOD 5 G6 POD, GEN 5,) MISC CHANGE POD EVERY 3 DAYS AS DIRECTED. 10 each 0   insulin glargine, 2 Unit Dial, (TOUJEO MAX SOLOSTAR) 300 UNIT/ML Solostar Pen Inject 110 Units into the skin daily. 18 mL 1   Insulin Pen Needle 31G X 5 MM MISC Use for insulin and Victoza 200 each 1   insulin regular human CONCENTRATED (HUMULIN R U-500 KWIKPEN) 500 UNIT/ML KwikPen INJECT 80-110 UNITS BEFORE MEALS AND AT BEDTIME. ADJUST AS DIRECTED 18  mL 1   metoprolol tartrate (LOPRESSOR) 50 MG tablet TAKE (1) TABLET BY MOUTH TWICE DAILY. 180 tablet 3   Multiple Vitamins-Minerals (MULTIVITAMIN WITH MINERALS) tablet Take 1 tablet by mouth daily.     nystatin cream (MYCOSTATIN) Apply 1 Application topically 2 (two) times daily. Apply to affected area BID for up to 7 days. 30 g 0   omeprazole (PRILOSEC) 20 MG capsule Take 20 mg by mouth 2 (two) times daily before a meal.     ondansetron (ZOFRAN) 4 MG tablet Take 1 tablet (4 mg total) by mouth every 8 (eight) hours as needed for nausea or vomiting. 20 tablet 0   rosuvastatin (CRESTOR) 20 MG tablet Take 1 tablet (20 mg total) by mouth daily. 90 tablet 3   valsartan (DIOVAN) 320 MG tablet Take 320 mg by mouth daily.      venlafaxine XR (EFFEXOR-XR) 150 MG 24 hr capsule Take 150 mg by mouth daily with breakfast.     Vitamin D, Cholecalciferol, 1000 units CAPS Take 1,000 Units by mouth daily.     brompheniramine-pseudoephedrine-DM 30-2-10 MG/5ML syrup Take 5 mLs by mouth 4 (four) times daily as needed. (Patient not taking: Reported on 03/13/2022) 140 mL 0   gabapentin (NEURONTIN) 100 MG capsule Take one capsule po qam 90 capsule 3   MOUNJARO 2.5 MG/0.5ML Pen SMARTSIG:2.5 Milligram(s) SUB-Q Once a Week     No current facility-administered medications for this visit.    Family History  Problem Relation Age of Onset   Diabetes Mother    Diabetes Maternal Grandmother    Thyroid disease Cousin    Colon cancer Neg Hx    Colon polyps Neg Hx    Esophageal cancer Neg Hx    Rectal cancer Neg Hx    Stomach cancer Neg Hx     Review of Systems  All other systems reviewed and are negative.   Exam:   BP 110/64   Pulse 72   Ht 4' 11.5" (1.511 m)   Wt 167 lb (75.8 kg)   SpO2 100%   BMI 33.17 kg/m   Weight change: @WEIGHTCHANGE$ @ Height:   Height: 4' 11.5" (151.1 cm)  Ht Readings from Last 3 Encounters:  03/13/22 4' 11.5" (1.511 m)  02/06/22 4' 11.5" (1.511 m)  07/10/21 4' 11"$  (1.499 m)     General appearance: alert, cooperative and appears stated age Head: Normocephalic, without obvious abnormality, atraumatic Neck: no adenopathy, supple, symmetrical, trachea midline and thyroid normal to inspection and palpation Lungs: clear to auscultation bilaterally  Cardiovascular: regular rate and rhythm Breasts: normal appearance, no masses or tenderness Abdomen: soft, non-tender; non distended,  no masses,  no organomegaly Extremities: extremities normal, atraumatic, no cyanosis or edema Skin: Skin color, texture, turgor normal. No rashes or lesions Lymph nodes: Cervical, supraclavicular, and axillary nodes normal. No abnormal inguinal nodes palpated Neurologic: Grossly normal   Pelvic: External genitalia:  no lesions              Urethra:  normal appearing urethra with no masses, tenderness or lesions              Bartholins and Skenes: normal                 Vagina: normal appearing vagina with normal color and discharge, no lesions              Cervix: absent               Bimanual Exam:  Uterus:  uterus absent              Adnexa: no mass, fullness, tenderness               Rectovaginal: Confirms               Anus:  normal sphincter tone, no lesions  Gae Dry, CMA chaperoned for the exam.  1. Well woman exam Discussed breast self exam Discussed calcium and vit D intake Mammogram and colonoscopy UTD  2. Hot flashes Doing well on a low dose of gabapentin - gabapentin (NEURONTIN) 100 MG capsule; Take one capsule po qam  Dispense: 90 capsule; Refill: 3

## 2022-03-13 ENCOUNTER — Ambulatory Visit (INDEPENDENT_AMBULATORY_CARE_PROVIDER_SITE_OTHER): Payer: BC Managed Care – PPO | Admitting: Obstetrics and Gynecology

## 2022-03-13 ENCOUNTER — Encounter: Payer: Self-pay | Admitting: Obstetrics and Gynecology

## 2022-03-13 VITALS — BP 110/64 | HR 72 | Ht 59.5 in | Wt 167.0 lb

## 2022-03-13 DIAGNOSIS — Z01419 Encounter for gynecological examination (general) (routine) without abnormal findings: Secondary | ICD-10-CM

## 2022-03-13 DIAGNOSIS — R232 Flushing: Secondary | ICD-10-CM

## 2022-03-13 MED ORDER — GABAPENTIN 100 MG PO CAPS: ORAL_CAPSULE | ORAL | 3 refills | Status: DC

## 2022-03-13 NOTE — Patient Instructions (Signed)

## 2022-03-28 DIAGNOSIS — E1169 Type 2 diabetes mellitus with other specified complication: Secondary | ICD-10-CM | POA: Diagnosis not present

## 2022-03-28 DIAGNOSIS — F418 Other specified anxiety disorders: Secondary | ICD-10-CM | POA: Diagnosis not present

## 2022-03-28 DIAGNOSIS — E669 Obesity, unspecified: Secondary | ICD-10-CM | POA: Diagnosis not present

## 2022-03-28 DIAGNOSIS — Z6832 Body mass index (BMI) 32.0-32.9, adult: Secondary | ICD-10-CM | POA: Diagnosis not present

## 2022-04-10 ENCOUNTER — Other Ambulatory Visit: Payer: Self-pay | Admitting: Endocrinology

## 2022-04-10 DIAGNOSIS — E1165 Type 2 diabetes mellitus with hyperglycemia: Secondary | ICD-10-CM

## 2022-04-18 DIAGNOSIS — E1169 Type 2 diabetes mellitus with other specified complication: Secondary | ICD-10-CM | POA: Diagnosis not present

## 2022-04-18 DIAGNOSIS — I152 Hypertension secondary to endocrine disorders: Secondary | ICD-10-CM | POA: Diagnosis not present

## 2022-04-18 DIAGNOSIS — R632 Polyphagia: Secondary | ICD-10-CM | POA: Diagnosis not present

## 2022-04-18 DIAGNOSIS — E669 Obesity, unspecified: Secondary | ICD-10-CM | POA: Diagnosis not present

## 2022-05-04 DIAGNOSIS — Z0000 Encounter for general adult medical examination without abnormal findings: Secondary | ICD-10-CM | POA: Diagnosis not present

## 2022-05-04 DIAGNOSIS — E782 Mixed hyperlipidemia: Secondary | ICD-10-CM | POA: Diagnosis not present

## 2022-05-04 DIAGNOSIS — E1122 Type 2 diabetes mellitus with diabetic chronic kidney disease: Secondary | ICD-10-CM | POA: Diagnosis not present

## 2022-05-04 DIAGNOSIS — N181 Chronic kidney disease, stage 1: Secondary | ICD-10-CM | POA: Diagnosis not present

## 2022-05-04 DIAGNOSIS — I129 Hypertensive chronic kidney disease with stage 1 through stage 4 chronic kidney disease, or unspecified chronic kidney disease: Secondary | ICD-10-CM | POA: Diagnosis not present

## 2022-05-09 DIAGNOSIS — E782 Mixed hyperlipidemia: Secondary | ICD-10-CM | POA: Diagnosis not present

## 2022-05-09 DIAGNOSIS — E1122 Type 2 diabetes mellitus with diabetic chronic kidney disease: Secondary | ICD-10-CM | POA: Diagnosis not present

## 2022-05-09 DIAGNOSIS — E669 Obesity, unspecified: Secondary | ICD-10-CM | POA: Diagnosis not present

## 2022-05-09 DIAGNOSIS — R632 Polyphagia: Secondary | ICD-10-CM | POA: Diagnosis not present

## 2022-05-17 ENCOUNTER — Other Ambulatory Visit: Payer: Self-pay | Admitting: Obstetrics and Gynecology

## 2022-05-17 DIAGNOSIS — B372 Candidiasis of skin and nail: Secondary | ICD-10-CM

## 2022-05-17 NOTE — Telephone Encounter (Signed)
Med refill request: Nystatin Last AEX: 03/13/22 Next AEX: n/a Refill authorized: Please Advise?

## 2022-05-29 DIAGNOSIS — Z794 Long term (current) use of insulin: Secondary | ICD-10-CM | POA: Diagnosis not present

## 2022-05-29 DIAGNOSIS — E782 Mixed hyperlipidemia: Secondary | ICD-10-CM | POA: Diagnosis not present

## 2022-05-29 DIAGNOSIS — F4323 Adjustment disorder with mixed anxiety and depressed mood: Secondary | ICD-10-CM | POA: Diagnosis not present

## 2022-05-29 DIAGNOSIS — F419 Anxiety disorder, unspecified: Secondary | ICD-10-CM | POA: Diagnosis not present

## 2022-05-29 DIAGNOSIS — E1169 Type 2 diabetes mellitus with other specified complication: Secondary | ICD-10-CM | POA: Diagnosis not present

## 2022-05-29 DIAGNOSIS — Z9189 Other specified personal risk factors, not elsewhere classified: Secondary | ICD-10-CM | POA: Diagnosis not present

## 2022-06-04 ENCOUNTER — Other Ambulatory Visit: Payer: Self-pay | Admitting: Endocrinology

## 2022-06-05 DIAGNOSIS — F4323 Adjustment disorder with mixed anxiety and depressed mood: Secondary | ICD-10-CM | POA: Diagnosis not present

## 2022-06-12 DIAGNOSIS — F4323 Adjustment disorder with mixed anxiety and depressed mood: Secondary | ICD-10-CM | POA: Diagnosis not present

## 2022-06-19 DIAGNOSIS — R632 Polyphagia: Secondary | ICD-10-CM | POA: Diagnosis not present

## 2022-06-19 DIAGNOSIS — E782 Mixed hyperlipidemia: Secondary | ICD-10-CM | POA: Diagnosis not present

## 2022-06-19 DIAGNOSIS — Z9189 Other specified personal risk factors, not elsewhere classified: Secondary | ICD-10-CM | POA: Diagnosis not present

## 2022-06-19 DIAGNOSIS — F4323 Adjustment disorder with mixed anxiety and depressed mood: Secondary | ICD-10-CM | POA: Diagnosis not present

## 2022-06-19 DIAGNOSIS — E1169 Type 2 diabetes mellitus with other specified complication: Secondary | ICD-10-CM | POA: Diagnosis not present

## 2022-06-26 DIAGNOSIS — F4323 Adjustment disorder with mixed anxiety and depressed mood: Secondary | ICD-10-CM | POA: Diagnosis not present

## 2022-07-03 ENCOUNTER — Other Ambulatory Visit: Payer: Self-pay | Admitting: Endocrinology

## 2022-07-05 DIAGNOSIS — F4323 Adjustment disorder with mixed anxiety and depressed mood: Secondary | ICD-10-CM | POA: Diagnosis not present

## 2022-07-11 DIAGNOSIS — R632 Polyphagia: Secondary | ICD-10-CM | POA: Diagnosis not present

## 2022-07-11 DIAGNOSIS — E1169 Type 2 diabetes mellitus with other specified complication: Secondary | ICD-10-CM | POA: Diagnosis not present

## 2022-07-11 DIAGNOSIS — Z9189 Other specified personal risk factors, not elsewhere classified: Secondary | ICD-10-CM | POA: Diagnosis not present

## 2022-07-11 DIAGNOSIS — E782 Mixed hyperlipidemia: Secondary | ICD-10-CM | POA: Diagnosis not present

## 2022-07-12 DIAGNOSIS — F4323 Adjustment disorder with mixed anxiety and depressed mood: Secondary | ICD-10-CM | POA: Diagnosis not present

## 2022-07-19 DIAGNOSIS — F4323 Adjustment disorder with mixed anxiety and depressed mood: Secondary | ICD-10-CM | POA: Diagnosis not present

## 2022-07-31 DIAGNOSIS — F4323 Adjustment disorder with mixed anxiety and depressed mood: Secondary | ICD-10-CM | POA: Diagnosis not present

## 2022-08-04 ENCOUNTER — Other Ambulatory Visit: Payer: Self-pay | Admitting: Endocrinology

## 2022-08-06 DIAGNOSIS — F4323 Adjustment disorder with mixed anxiety and depressed mood: Secondary | ICD-10-CM | POA: Diagnosis not present

## 2022-08-07 DIAGNOSIS — Z9189 Other specified personal risk factors, not elsewhere classified: Secondary | ICD-10-CM | POA: Diagnosis not present

## 2022-08-07 DIAGNOSIS — K5909 Other constipation: Secondary | ICD-10-CM | POA: Diagnosis not present

## 2022-08-07 DIAGNOSIS — E1122 Type 2 diabetes mellitus with diabetic chronic kidney disease: Secondary | ICD-10-CM | POA: Diagnosis not present

## 2022-08-07 DIAGNOSIS — F3289 Other specified depressive episodes: Secondary | ICD-10-CM | POA: Diagnosis not present

## 2022-08-16 DIAGNOSIS — F4323 Adjustment disorder with mixed anxiety and depressed mood: Secondary | ICD-10-CM | POA: Diagnosis not present

## 2022-08-23 DIAGNOSIS — F4323 Adjustment disorder with mixed anxiety and depressed mood: Secondary | ICD-10-CM | POA: Diagnosis not present

## 2022-08-30 DIAGNOSIS — F4323 Adjustment disorder with mixed anxiety and depressed mood: Secondary | ICD-10-CM | POA: Diagnosis not present

## 2022-09-03 DIAGNOSIS — E1165 Type 2 diabetes mellitus with hyperglycemia: Secondary | ICD-10-CM | POA: Diagnosis not present

## 2022-09-03 DIAGNOSIS — K5903 Drug induced constipation: Secondary | ICD-10-CM | POA: Diagnosis not present

## 2022-09-03 DIAGNOSIS — Z794 Long term (current) use of insulin: Secondary | ICD-10-CM | POA: Diagnosis not present

## 2022-09-03 DIAGNOSIS — Z72820 Sleep deprivation: Secondary | ICD-10-CM | POA: Diagnosis not present

## 2022-09-18 DIAGNOSIS — F4323 Adjustment disorder with mixed anxiety and depressed mood: Secondary | ICD-10-CM | POA: Diagnosis not present

## 2022-09-20 ENCOUNTER — Other Ambulatory Visit: Payer: Self-pay | Admitting: Cardiovascular Disease

## 2022-09-24 DIAGNOSIS — Z6827 Body mass index (BMI) 27.0-27.9, adult: Secondary | ICD-10-CM | POA: Diagnosis not present

## 2022-09-24 DIAGNOSIS — R5383 Other fatigue: Secondary | ICD-10-CM | POA: Diagnosis not present

## 2022-09-24 DIAGNOSIS — E663 Overweight: Secondary | ICD-10-CM | POA: Diagnosis not present

## 2022-09-24 DIAGNOSIS — E1165 Type 2 diabetes mellitus with hyperglycemia: Secondary | ICD-10-CM | POA: Diagnosis not present

## 2022-09-25 DIAGNOSIS — F4323 Adjustment disorder with mixed anxiety and depressed mood: Secondary | ICD-10-CM | POA: Diagnosis not present

## 2022-10-02 DIAGNOSIS — F4323 Adjustment disorder with mixed anxiety and depressed mood: Secondary | ICD-10-CM | POA: Diagnosis not present

## 2022-10-09 DIAGNOSIS — F4323 Adjustment disorder with mixed anxiety and depressed mood: Secondary | ICD-10-CM | POA: Diagnosis not present

## 2022-10-15 DIAGNOSIS — E1122 Type 2 diabetes mellitus with diabetic chronic kidney disease: Secondary | ICD-10-CM | POA: Diagnosis not present

## 2022-10-15 DIAGNOSIS — E782 Mixed hyperlipidemia: Secondary | ICD-10-CM | POA: Diagnosis not present

## 2022-10-15 DIAGNOSIS — Z8639 Personal history of other endocrine, nutritional and metabolic disease: Secondary | ICD-10-CM | POA: Diagnosis not present

## 2022-10-15 DIAGNOSIS — Z9189 Other specified personal risk factors, not elsewhere classified: Secondary | ICD-10-CM | POA: Diagnosis not present

## 2022-10-15 DIAGNOSIS — F439 Reaction to severe stress, unspecified: Secondary | ICD-10-CM | POA: Diagnosis not present

## 2022-10-22 ENCOUNTER — Other Ambulatory Visit: Payer: Self-pay | Admitting: Cardiovascular Disease

## 2022-10-23 DIAGNOSIS — F4323 Adjustment disorder with mixed anxiety and depressed mood: Secondary | ICD-10-CM | POA: Diagnosis not present

## 2022-11-05 DIAGNOSIS — E1169 Type 2 diabetes mellitus with other specified complication: Secondary | ICD-10-CM | POA: Diagnosis not present

## 2022-11-05 DIAGNOSIS — Z8639 Personal history of other endocrine, nutritional and metabolic disease: Secondary | ICD-10-CM | POA: Diagnosis not present

## 2022-11-05 DIAGNOSIS — Z9189 Other specified personal risk factors, not elsewhere classified: Secondary | ICD-10-CM | POA: Diagnosis not present

## 2022-11-05 DIAGNOSIS — R5383 Other fatigue: Secondary | ICD-10-CM | POA: Diagnosis not present

## 2022-11-05 DIAGNOSIS — F4323 Adjustment disorder with mixed anxiety and depressed mood: Secondary | ICD-10-CM | POA: Diagnosis not present

## 2022-11-05 DIAGNOSIS — K9041 Non-celiac gluten sensitivity: Secondary | ICD-10-CM | POA: Diagnosis not present

## 2022-11-08 ENCOUNTER — Other Ambulatory Visit: Payer: Self-pay | Admitting: Family Medicine

## 2022-11-08 ENCOUNTER — Ambulatory Visit
Admission: RE | Admit: 2022-11-08 | Discharge: 2022-11-08 | Disposition: A | Discharge status: Home / Self Care | Payer: BC Managed Care – PPO | Source: Ambulatory Visit | Attending: Family Medicine | Admitting: Family Medicine

## 2022-11-08 DIAGNOSIS — E1122 Type 2 diabetes mellitus with diabetic chronic kidney disease: Secondary | ICD-10-CM | POA: Diagnosis not present

## 2022-11-08 DIAGNOSIS — R0600 Dyspnea, unspecified: Secondary | ICD-10-CM

## 2022-11-08 DIAGNOSIS — E782 Mixed hyperlipidemia: Secondary | ICD-10-CM | POA: Diagnosis not present

## 2022-11-08 DIAGNOSIS — I129 Hypertensive chronic kidney disease with stage 1 through stage 4 chronic kidney disease, or unspecified chronic kidney disease: Secondary | ICD-10-CM | POA: Diagnosis not present

## 2022-11-08 DIAGNOSIS — N181 Chronic kidney disease, stage 1: Secondary | ICD-10-CM | POA: Diagnosis not present

## 2022-11-20 DIAGNOSIS — F4323 Adjustment disorder with mixed anxiety and depressed mood: Secondary | ICD-10-CM | POA: Diagnosis not present

## 2022-11-28 ENCOUNTER — Telehealth: Payer: Self-pay | Admitting: Cardiovascular Disease

## 2022-11-28 DIAGNOSIS — E782 Mixed hyperlipidemia: Secondary | ICD-10-CM

## 2022-11-28 DIAGNOSIS — R079 Chest pain, unspecified: Secondary | ICD-10-CM

## 2022-11-28 DIAGNOSIS — R000 Tachycardia, unspecified: Secondary | ICD-10-CM

## 2022-11-28 DIAGNOSIS — R0609 Other forms of dyspnea: Secondary | ICD-10-CM

## 2022-11-28 DIAGNOSIS — R002 Palpitations: Secondary | ICD-10-CM

## 2022-11-28 NOTE — Telephone Encounter (Signed)
Pt reporting SOB w/ ambulation, sometimes at rest.  She states that it feels like she did when she was put on Metoprolol for her tachycardia. Today her HR was 136 during a normal walk (not brisk). Tachycardia occurred this morning while in the shower, but unsure what HR was as she did not have her watch on.   Nausea/vomiting may be related to new medicine vs illness, but she will continue monitoring things.  She is not sure she should wait until December for evaluation. States she did see PCP for this, ordered CXR which did not show anything. Aware will forward to MD for review/advisement.  Aware it may be next week before hearing back from [place]. She is fine w/ this as she does not think it is urgent but that she should not wait until 12/10 appt. She will call the office back if needs to re-discuss before hearing back from [place].

## 2022-11-28 NOTE — Telephone Encounter (Signed)
Pt c/o Shortness Of Breath: STAT if SOB developed within the last 24 hours or pt is noticeably SOB on the phone  1. Are you currently SOB (can you hear that pt is SOB on the phone)? no  2. How long have you been experiencing SOB? It has been a while but is getting worse  3. Are you SOB when sitting or when up moving around? Up moving around  4. Are you currently experiencing any other symptoms? Nausea, vomiting, elevated hr  of 136

## 2022-12-03 ENCOUNTER — Other Ambulatory Visit: Payer: Self-pay | Admitting: Cardiovascular Disease

## 2022-12-03 MED ORDER — METOPROLOL TARTRATE 50 MG PO TABS: 50.0000 mg | ORAL_TABLET | Freq: Two times a day (BID) | ORAL | 0 refills | Status: DC

## 2022-12-03 NOTE — Telephone Encounter (Signed)
Placed order for 30 day monitor. Patient requested refill on metoprolol, and sent to her pharmacy of choice.

## 2022-12-10 DIAGNOSIS — R002 Palpitations: Secondary | ICD-10-CM | POA: Diagnosis not present

## 2022-12-10 DIAGNOSIS — R000 Tachycardia, unspecified: Secondary | ICD-10-CM

## 2022-12-10 DIAGNOSIS — R0609 Other forms of dyspnea: Secondary | ICD-10-CM

## 2022-12-10 DIAGNOSIS — E782 Mixed hyperlipidemia: Secondary | ICD-10-CM

## 2022-12-20 DIAGNOSIS — E782 Mixed hyperlipidemia: Secondary | ICD-10-CM | POA: Diagnosis not present

## 2022-12-20 DIAGNOSIS — R1312 Dysphagia, oropharyngeal phase: Secondary | ICD-10-CM | POA: Diagnosis not present

## 2022-12-20 DIAGNOSIS — Z1331 Encounter for screening for depression: Secondary | ICD-10-CM | POA: Diagnosis not present

## 2022-12-20 DIAGNOSIS — E1169 Type 2 diabetes mellitus with other specified complication: Secondary | ICD-10-CM | POA: Diagnosis not present

## 2022-12-20 DIAGNOSIS — K5903 Drug induced constipation: Secondary | ICD-10-CM | POA: Diagnosis not present

## 2022-12-24 DIAGNOSIS — F4323 Adjustment disorder with mixed anxiety and depressed mood: Secondary | ICD-10-CM | POA: Diagnosis not present

## 2023-01-04 ENCOUNTER — Other Ambulatory Visit: Payer: Self-pay | Admitting: Cardiovascular Disease

## 2023-01-08 ENCOUNTER — Ambulatory Visit: Payer: BC Managed Care – PPO | Admitting: Physician Assistant

## 2023-01-08 ENCOUNTER — Ambulatory Visit: Payer: BC Managed Care – PPO | Attending: Physician Assistant | Admitting: Physician Assistant

## 2023-01-08 ENCOUNTER — Encounter: Payer: Self-pay | Admitting: Physician Assistant

## 2023-01-08 ENCOUNTER — Other Ambulatory Visit: Payer: Self-pay

## 2023-01-08 VITALS — BP 108/68 | HR 83 | Ht 59.5 in | Wt 132.0 lb

## 2023-01-08 DIAGNOSIS — R000 Tachycardia, unspecified: Secondary | ICD-10-CM | POA: Diagnosis not present

## 2023-01-08 DIAGNOSIS — E785 Hyperlipidemia, unspecified: Secondary | ICD-10-CM | POA: Diagnosis not present

## 2023-01-08 DIAGNOSIS — R079 Chest pain, unspecified: Secondary | ICD-10-CM | POA: Diagnosis not present

## 2023-01-08 DIAGNOSIS — R002 Palpitations: Secondary | ICD-10-CM

## 2023-01-08 MED ORDER — METOPROLOL TARTRATE 50 MG PO TABS: 50.0000 mg | ORAL_TABLET | Freq: Two times a day (BID) | ORAL | 3 refills | Status: DC

## 2023-01-08 MED ORDER — CARVEDILOL 6.25 MG PO TABS: 6.2500 mg | ORAL_TABLET | Freq: Two times a day (BID) | ORAL | 3 refills | Status: AC

## 2023-01-08 NOTE — Progress Notes (Signed)
Cardiology Office Note:  .   Date:  01/08/2023  ID:  Nadean Corwin, DOB 1970/03/07, MRN [id number] PCP: Lupita Raider, MD  [facility] Providers Cardiologist:  Charlton Haws, MD {  History of Present Illness: .   NOHEMI MUSACCHIO is a 52 y.o. female with a past medical history of tachycardia and chest pain as well as DM, HTN, and HLD here for follow-up appointment.  She was seen in 2017 for tachycardia and cardiovascular risk factors.  EKG with narrow complex tachycardia versus long RP tachycardia.  Echo with normal LVEF.  Other issues include asthma.  Family history positive for father having early CAD with an MI in his 82s.  SSCP seen by PA October 2018 and subsequent cath no significant CAD per Dr. [place] with normal EF and normal LVEDP.  She is on insulin and has multiple side effects mostly GI/yeast infections with oral meds.  A1c slightly better but still high at 7.0.  Sees Dr. Lucianne Muss.  Monitor 01/28/2019 showed no arrhythmia with average heart rate 92 bpm.  Only 1 run of atrial tachycardia, 8 beats.  Normal circadian drop at night with lowest heart rate 63 bpm.  Echocardiogram 12/18/2018 with EF 66 5%, no valvular disease.  Seen in the ED by neuro for left facial numbness/17/23.  CT/MRI negative.  Bell's palsy diagnosed in Rx with valacyclovir and prednisone.  Still with asymmetric smile but much improved.  Was last seen in the clinic 07/10/2021.  Her youngest son was home with her oldest son also there.  Has some [place] health issues and can be violent.  Husband travels less to help out.  Today, she presents with chest pain that started a few months ago. The pain is described as sharp, located in the left armpit and center of the chest, and lasts for about an hour. The pain is intermittent and not consistent. The patient also experiences a sensation of heaviness and pressure when lying down. Non-exertional.  She had a coronary CT scan a year and a half ago with a calcium  score of 0.  In addition to chest pain, the patient reports significant fatigue, to the point of needing to nap for several hours during the day. This fatigue is persistent, even after a full night's sleep. The patient also experiences dizziness upon standing up, to the point of nearly passing out. The patient reports that during these episodes, her heart feels like it's racing.  The patient has recently lost 58 pounds and has tried to reduce her metoprolol dosage due to extreme tiredness, but this resulted in a worsening of symptoms. The patient has also stopped taking gabapentin, which was previously prescribed for hot flashes. The patient reports that she has been experiencing fewer hot flashes since losing weight.  Reports no shortness of breath nor dyspnea on exertion. Reports no chest pain, pressure, or tightness. No edema, orthopnea, PND. Reports no palpitations.   Discussed the use of AI scribe software for clinical note transcription with the patient, who gave verbal consent to proceed.  ROS: Pertinent ROS in HPI  Studies Reviewed: Marland Kitchen        LEFT HEART CATH AND CORONARY ANGIOGRAPHY 10/2017  Conclusion      The left ventricular systolic function is normal. LV end diastolic pressure is normal. The left ventricular ejection fraction is 55-65% by visual estimate.     1. Normal coronary anatomy 2. Normal LV function 3. Normal LVEDP   Plan: consider alternative causes of chest pain  No indication for antiplatelet therapy at this time.        GXT Study Highlights 03/2016     Blood pressure demonstrated a normal response to exercise. There was no ST segment deviation noted during stress.   Normal exercise treadmill stress test. Normal functional capacity. Normal BP response to exertion.       Echo Study Conclusions 06/2015   - Left ventricle: The cavity size was normal. Systolic function was   normal. The estimated ejection fraction was in the range of 55%   to 60%.  Wall motion was normal; there were no regional wall   motion abnormalities. Left ventricular diastolic function   parameters were normal. - Atrial septum: No defect or patent foramen ovale was identified.       Physical Exam:   VS:  BP 108/68   Pulse 83   Ht 4' 11.5" (1.511 m)   Wt 132 lb (59.9 kg)   SpO2 98%   BMI 26.21 kg/m    Wt Readings from Last 3 Encounters:  01/08/23 132 lb (59.9 kg)  03/13/22 167 lb (75.8 kg)  02/06/22 168 lb (76.2 kg)    GEN: Well nourished, well developed in no acute distress NECK: No JVD; No carotid bruits CARDIAC: RRR, no murmurs, rubs, gallops RESPIRATORY:  Clear to auscultation without rales, wheezing or rhonchi  ABDOMEN: Soft, non-tender, non-distended EXTREMITIES:  No edema; No deformity   ASSESSMENT AND PLAN: .    Chest Pain Recurrent episodes of chest pain, described as sharp and located in the left armpit and center of the chest. Pain is intermittent, lasting for a few minutes and then subsiding. Previous CT scan a year and a half ago showed a calcium score of zero. -Order an echocardiogram to assess for potential structural issues or inflammation of the pericardium.   Dizziness and Tachycardia Reports of lightheadedness and near syncope upon standing, accompanied by a sensation of a racing heart. Recent heart monitor showed episodes of sinus tachycardia. -Change from Metoprolol to Carvedilol 6.25mg  twice daily to potentially suppress episodes of sinus tachycardia and reduce fatigue.  Fatigue Reports of extreme fatigue, requiring daily naps of up to four hours. -Order a TSH to assess thyroid function as a potential cause of fatigue. -Consider switching Valsartan to nighttime dosing to potentially reduce daytime fatigue.  Hyperlipidemia Elevated triglycerides noted on recent labs. -Continue current management and monitor.  Follow-up Plan for close follow-up after echocardiogram to discuss results and assess response to change in beta  blocker. If echocardiogram and thyroid function tests are normal, consider referral to primary care provider for further investigation into cause of fatigue.      Dispo: She can follow-up in 4 weeks  Signed, Sharlene Dory, PA-C

## 2023-01-08 NOTE — Patient Instructions (Addendum)
Medication Instructions:    STOP TAKING : METOPROLOL     START TAKING: COREG 6.25 MG TWICE A DAY    START TAKING AT NIGHT TIME : VALSARTAN   AND  CRESTOR   *If you need a refill on your cardiac medications before your next appointment, please call your pharmacy*   Lab Work: TSH TODAY  PLEASE GO DOWN STAIRS FIRST FLOOR  [address] LABS NEEDED BMET CBC AND PT-INR TODAY     If you have labs (blood work) drawn today and your tests are completely normal, you will receive your results only by: MyChart Message (if you have MyChart) OR A paper copy in the mail If you have any lab test that is abnormal or we need to change your treatment, we will call you to review the results.   Testing/Procedures: Your physician has requested that you have an echocardiogram. Echocardiography is a painless test that uses sound waves to create images of your heart. It provides your doctor with information about the size and shape of your heart and how well your heart's chambers and valves are working. This procedure takes approximately one hour. There are no restrictions for this procedure. Please do NOT wear cologne, perfume, aftershave, or lotions (deodorant is allowed). Please arrive 15 minutes prior to your appointment time.  Please note: We ask at that you not bring children with you during ultrasound (echo/ vascular) testing. Due to room size and safety concerns, children are not allowed in the ultrasound rooms during exams. Our front office staff cannot provide observation of children in our lobby area while testing is being conducted. An adult accompanying a patient to their appointment will only be allowed in the ultrasound room at the discretion of the ultrasound technician under special circumstances. We apologize for any inconvenience.      Follow-Up: At [facility], you and your health needs are our priority.  As part of our continuing mission to provide you with exceptional heart  care, we have created designated Provider Care Teams.  These Care Teams include your primary Cardiologist (physician) and Advanced Practice Providers (APPs -  Physician Assistants and Nurse Practitioners) who all work together to provide you with the care you need, when you need it.  We recommend signing up for the patient portal called "MyChart".  Sign up information is provided on this After Visit Summary.  MyChart is used to connect with patients for Virtual Visits (Telemedicine).  Patients are able to view lab/test results, encounter notes, upcoming appointments, etc.  Non-urgent messages can be sent to your provider as well.   To learn more about what you can do with MyChart, go to [email or web address].    Your next appointment:    4 week(s)  Provider:    Charlton Haws, MD  or     Jari Favre / APP    Other Instructions

## 2023-01-09 LAB — TSH: TSH: 1.25 u[IU]/mL (ref 0.450–4.500)

## 2023-01-11 ENCOUNTER — Ambulatory Visit: Payer: BC Managed Care – PPO | Attending: Cardiovascular Disease

## 2023-01-11 DIAGNOSIS — R0609 Other forms of dyspnea: Secondary | ICD-10-CM

## 2023-01-11 DIAGNOSIS — R002 Palpitations: Secondary | ICD-10-CM

## 2023-01-11 DIAGNOSIS — R000 Tachycardia, unspecified: Secondary | ICD-10-CM

## 2023-01-11 DIAGNOSIS — E782 Mixed hyperlipidemia: Secondary | ICD-10-CM

## 2023-01-17 DIAGNOSIS — E782 Mixed hyperlipidemia: Secondary | ICD-10-CM | POA: Diagnosis not present

## 2023-01-17 DIAGNOSIS — E1122 Type 2 diabetes mellitus with diabetic chronic kidney disease: Secondary | ICD-10-CM | POA: Diagnosis not present

## 2023-01-17 DIAGNOSIS — I152 Hypertension secondary to endocrine disorders: Secondary | ICD-10-CM | POA: Diagnosis not present

## 2023-01-17 DIAGNOSIS — E663 Overweight: Secondary | ICD-10-CM | POA: Diagnosis not present

## 2023-01-29 DIAGNOSIS — F4323 Adjustment disorder with mixed anxiety and depressed mood: Secondary | ICD-10-CM | POA: Diagnosis not present

## 2023-02-05 NOTE — Progress Notes (Signed)
 Date:  02/13/2023    Woodard Haymaker Date of Birth: 1970/03/24 Medical Record #[id number]  PCP:  Glena Landau, MD [facility]  Cardiologist:  Kizzie Perks     History of Present Illness: Amy Clarke is a 53 y.o. female with history of chest pain    She was seen here back in 2017 for tachycardia and cardiovascular risk factors which include DM, HTN and HLD. EKG with narrow complex tachycardia vs long RP tachycardia.   Echo with normal EF. Other issues include asthma. FH + for father having early CAD with an MI in his 58's.    SSCP seen by PA October 2019 subsequent cath with no significant CAD per Dr Swaziland with normal EF and normal LVEDP  She is on insulin  as she had multiple side effects mostly GI/Yest infections with oral meds  A1c better but still high 7.0 03/30/21 Sees Dr Hubert Madden   3 of her 4 sons are home [place] Has 3 dogs  all from the shelter Youngest son is home with her and doing diesel engine trade. Oldest with behavioral issues towards Her now living with grand parents  Monitor 01/28/19 showed no arrhythmia with average HR 92 bpm Only one run of atrial tachycardia 8 beats  Normal circadian drop at night lowest HR 63 bpm   Seen in ED and by neuro for left facial numbness 05/15/21  CT/MRI negative Bell's palsy diagnosed and Rx with Valacyclovir  and prednisone  Still with asymmetric smile but much improved   Seen by PA 01/17/23 complained of SSCP for a few months This is accompanied by fatigue and day time napping Some postural symptoms may be from 58 lb weight loss Tried to cut back lopressor  without help and stopped gabapentin  since her hot flashes were less with weight loss. Calcium  score 07/18/21 was 0. Echo not done monitor 01/11/23 with no significant arrhythmias and symptoms of palpitations, dyspnea and fatigue only showed NSR/ST  Echo 02/07/23 reviewed and low normal 50-55% normal RV trivial MR Telemetry 01/11/23 no significant arrhythmias  symptoms of palpitations,dyspnea and fatigue Only correlate with NSR  Past Medical History:  Diagnosis Date   Allergy    seasonal and environmental   Anemia 2011   before hysterectemy   Asthma    Complication of anesthesia    hard time waking up   Depression    Diabetes [facility])    Dysrhythmia    father died at age 61 due to MI, heart cath in 10/2018, no intervention   GERD (gastroesophageal reflux disease)    History of kidney stones    Hyperlipidemia    Hypertension    Kidney cysts    Kidney stones    Migraine headache    PONV (postoperative nausea and vomiting)     Past Surgical History:  Procedure Laterality Date   ABDOMINAL HYSTERECTOMY     CARDIAC CATHETERIZATION     CESAREAN SECTION     x2   CHOLECYSTECTOMY     LEFT HEART CATH AND CORONARY ANGIOGRAPHY N/A 10/29/2017   Procedure: LEFT HEART CATH AND CORONARY ANGIOGRAPHY;  Surgeon: Swaziland, Giannie Soliday M, MD;  Location: [facility];  Service: Cardiovascular;  Laterality: N/A;   LITHOTRIPSY     x3   NASAL SEPTOPLASTY W/ TURBINOPLASTY Bilateral 11/04/2019   Procedure: NASAL SEPTOPLASTY WITH TURBINATE REDUCTION;  Surgeon: Ammon Bales, MD;  Location: [facility];  Service: ENT;  Laterality: Bilateral;   none  TONSILLECTOMY       Medications: Current Meds  Medication Sig   albuterol  (VENTOLIN  HFA) 108 (90 Base) MCG/ACT inhaler Inhale 2 puffs into the lungs every 6 (six) hours as needed for wheezing or shortness of breath.   brompheniramine-pseudoephedrine-DM 30-2-10 MG/5ML syrup Take 5 mLs by mouth 4 (four) times daily as needed.   budesonide-formoterol (SYMBICORT) 160-4.5 MCG/ACT inhaler Inhale 2 puffs into the lungs 2 (two) times daily as needed (asthma).    carvedilol  (COREG ) 6.25 MG tablet Take 1 tablet (6.25 mg total) by mouth 2 (two) times daily with a meal.   Continuous Glucose Receiver (DEXCOM G7 RECEIVER) DEVI by Does not apply route as directed.   Continuous Glucose Sensor (DEXCOM G7  SENSOR) MISC as directed.   ezetimibe  (ZETIA ) 10 MG tablet TAKE (1) TABLET BY MOUTH ONCE DAILY.   fenofibrate  (TRICOR ) 145 MG tablet TAKE (1) TABLET BY MOUTH ONCE DAILY.   fluticasone  (FLONASE) 50 MCG/ACT nasal spray Place 2 sprays into both nostrils as needed for rhinitis or allergies.   glucose blood (ONE TOUCH ULTRA TEST) test strip Use as instructed to test 3 times daily   glucose blood test strip    HUMULIN  R 500 UNIT/ML injection USE INTO POD EVERY 3 DAYS.   MOUNJARO  10 MG/0.5ML Pen SMARTSIG:10 Milligram(s) SUB-Q Once a Week   Multiple Vitamins-Minerals (MULTIVITAMIN WITH MINERALS) tablet Take 1 tablet by mouth daily.   nitroGLYCERIN  (NITROSTAT ) 0.4 MG SL tablet Place 0.4 mg under the tongue every 5 (five) minutes as needed for chest pain.   omeprazole (PRILOSEC) 20 MG capsule Take 20 mg by mouth 2 (two) times daily before a meal.   ondansetron  (ZOFRAN ) 4 MG tablet Take 1 tablet (4 mg total) by mouth every 8 (eight) hours as needed for nausea or vomiting.   rosuvastatin  (CRESTOR ) 20 MG tablet Take 1 tablet (20 mg total) by mouth daily.   valsartan (DIOVAN) 320 MG tablet Take 320 mg by mouth daily.    venlafaxine  XR (EFFEXOR -XR) 150 MG 24 hr capsule Take 150 mg by mouth daily with breakfast.   Vitamin D , Cholecalciferol , 1000 units CAPS Take 1,000 Units by mouth daily.     Allergies: Allergies  Allergen Reactions   Pollen Extract Other (See Comments)   Elvina Hammers [Dapagliflozin] Other (See Comments)    YEAST INFECTION    Januvia [Sitagliptin] Diarrhea and Nausea And Vomiting   Lisinopril Cough   Tanzeum [Albiglutide] Nausea And Vomiting   Trulicity [Dulaglutide] Nausea Only   Adhesive [Tape] Rash   Sulfonamide Derivatives Rash    Social History: The patient  reports that she has never smoked. She has never used smokeless tobacco. She reports current alcohol  use. She reports that she does not use drugs.   Family History: The patient's family history includes Diabetes in her  maternal grandmother and mother; Thyroid  disease in her cousin.   Review of Systems: Please see the history of present illness.   Otherwise, the review of systems is positive for none.   All other systems are reviewed and negative.   Physical Exam: VS:  BP 108/72 (BP Location: Left Arm, Patient Position: Sitting, Cuff Size: Normal)   Pulse 97   Ht 4\' 11"  (1.499 m)   Wt 128 lb (58.1 kg)   SpO2 99%   BMI 25.85 kg/m  .  BMI Body mass index is 25.85 kg/m.  Wt Readings from Last 3 Encounters:  02/13/23 128 lb (58.1 kg)  01/08/23 132 lb (59.9 kg)  03/13/22 167  lb (75.8 kg)    Affect appropriate Healthy:  appears stated age HEENT: normal Neck supple with no adenopathy JVP normal no bruits no thyromegaly Lungs clear with no wheezing and good diaphragmatic motion Heart:  S1/S2 no murmur, no rub, gallop or click PMI normal Abdomen: benighn, BS positve, no tenderness, no AAA no bruit.  No HSM or HJR Distal pulses intact with no bruits No edema Neuro non-focal Skin warm and dry No muscular weakness    LABORATORY DATA:  EKG:   12/10/18 ST rate 111 nonspecific ST changes   Lab Results  Component Value Date   WBC 7.8 05/01/2021   HGB 16.3 (H) 05/01/2021   HCT 48.0 (H) 05/01/2021   PLT 245 05/01/2021   GLUCOSE 245 (H) 05/01/2021   CHOL 115 03/27/2021   TRIG 189.0 (H) 03/27/2021   HDL 32.90 (L) 03/27/2021   LDLDIRECT 137.0 11/22/2020   LDLCALC 44 03/27/2021   ALT 27 05/01/2021   AST 29 05/01/2021   NA 139 05/01/2021   K 4.3 05/01/2021   CL 101 05/01/2021   CREATININE 0.60 05/01/2021   BUN 18 05/01/2021   CO2 27 05/01/2021   TSH 1.250 01/08/2023   INR 1.0 05/01/2021   HGBA1C 7.0 (A) 03/30/2021   MICROALBUR 10.7 (H) 08/08/2020     BNP (last 3 results) No results for input(s): "BNP" in the last 8760 hours.  ProBNP (last 3 results) No results for input(s): "PROBNP" in the last 8760 hours.   Other Studies Reviewed Today:  LEFT HEART CATH AND CORONARY  ANGIOGRAPHY 10/2017  Conclusion     The left ventricular systolic function is normal. LV end diastolic pressure is normal. The left ventricular ejection fraction is 55-65% by visual estimate.     1. Normal coronary anatomy 2. Normal LV function 3. Normal LVEDP   Plan: consider alternative causes of chest pain   No indication for antiplatelet therapy at this time.     GXT Study Highlights 03/2016     Blood pressure demonstrated a normal response to exercise. There was no ST segment deviation noted during stress.   Normal exercise treadmill stress test. Normal functional capacity. Normal BP response to exertion.       Echo Study Conclusions 06/2015   - Left ventricle: The cavity size was normal. Systolic function was   normal. The estimated ejection fraction was in the range of 55%   to 60%. Wall motion was normal; there were no regional wall   motion abnormalities. Left ventricular diastolic function   parameters were normal. - Atrial septum: No defect or patent foramen ovale was identified.   Assessment/Plan:   1. Chest Pain :  Normal cath 10/29/17 normal echo 12/18/2018 and calcium  score 0 07/18/21 observe   2. Tachycardia:  Improved on beta blocker normal nightly circadian drop  Monitor 01/11/23 no arrhythmias to correlate with symptoms   3. DM - uncontrolled by A1C - per Dr Hubert Madden    4. HLD - on crestor  labs with primary    5. Obesity - CV risk factor modification encouraged.    6. Significant situational stress. This may be the culprit of her symptoms.   7. Bell's Palsy:  post Rx and neuro f/u improved    Disposition:   FU with us  in a year    Signed: Janelle Mediate, MD  02/13/2023 11:19 AM  [facility] [address] [place], [place]  [address] Phone: [phone] Fax: [phone]

## 2023-02-07 ENCOUNTER — Ambulatory Visit (HOSPITAL_COMMUNITY): Payer: Commercial Managed Care - HMO | Attending: Cardiology

## 2023-02-07 DIAGNOSIS — R079 Chest pain, unspecified: Secondary | ICD-10-CM | POA: Insufficient documentation

## 2023-02-07 LAB — ECHOCARDIOGRAM COMPLETE
Calc EF: 49.9 %
S' Lateral: 2.58 cm
Single Plane A2C EF: 50.6 %
Single Plane A4C EF: 49.4 %

## 2023-02-13 ENCOUNTER — Ambulatory Visit: Payer: Commercial Managed Care - HMO | Attending: Cardiovascular Disease | Admitting: Cardiovascular Disease

## 2023-02-13 VITALS — BP 108/72 | HR 97 | Ht 59.0 in | Wt 128.0 lb

## 2023-02-13 DIAGNOSIS — E785 Hyperlipidemia, unspecified: Secondary | ICD-10-CM | POA: Diagnosis not present

## 2023-02-13 DIAGNOSIS — R000 Tachycardia, unspecified: Secondary | ICD-10-CM | POA: Diagnosis not present

## 2023-02-13 DIAGNOSIS — R0609 Other forms of dyspnea: Secondary | ICD-10-CM

## 2023-02-13 DIAGNOSIS — R002 Palpitations: Secondary | ICD-10-CM | POA: Diagnosis not present

## 2023-02-13 NOTE — Patient Instructions (Signed)

## 2023-02-18 ENCOUNTER — Telehealth: Payer: Self-pay

## 2023-02-18 DIAGNOSIS — R9430 Abnormal result of cardiovascular function study, unspecified: Secondary | ICD-10-CM

## 2023-02-18 DIAGNOSIS — R0609 Other forms of dyspnea: Secondary | ICD-10-CM

## 2023-02-18 NOTE — Telephone Encounter (Signed)
Placed order for MRI.

## 2023-02-18 NOTE — Telephone Encounter (Signed)
-----   Message from Charlton Haws sent at 02/18/2023  9:28 AM EST ----- Can do in 3 months then ----- Message ----- From: Ethelda Chick, RN Sent: 02/18/2023   9:10 AM EST To: Wendall Stade, MD  Called patient. Patient stated she is open to having an MRI.

## 2023-03-15 ENCOUNTER — Ambulatory Visit: Payer: Self-pay | Admitting: Radiology

## 2023-03-25 IMAGING — MR MR HEAD W/O CM
14 of 15 series · 43 of 48 positions shown · non-contrast
Comparison: None.

CLINICAL DATA: Left-sided facial numbness and weakness

EXAM:
MRI HEAD WITHOUT CONTRAST
TECHNIQUE: Multiplanar, multiecho pulse sequences of the brain and surrounding
structures were obtained without intravenous contrast.

[Series 9: DWI · axial · 4.0mm · 0.92mm/px · z∈[-205,-61]mm · 5 of 72 slices shown (1 of 4)]
[im 1/72]
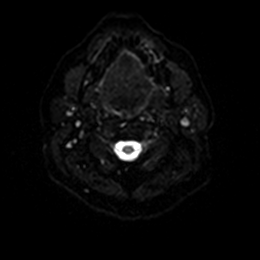
[im 18/72]
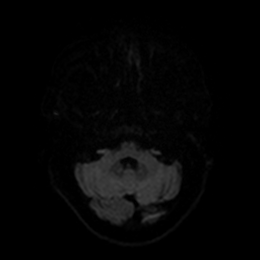
[im 36/72]
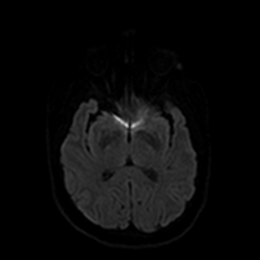
[im 54/72]
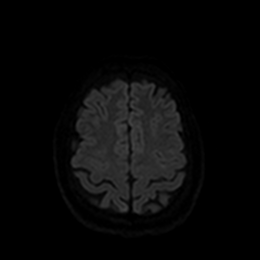
[im 72/72]
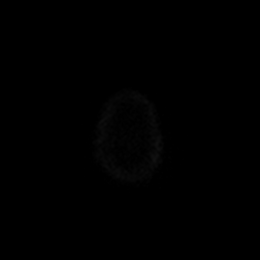

[Series 10: DWI · axial · 4.0mm · 0.92mm/px · z∈[-205,-61]mm · 2 of 36 slices shown (2 of 4)]
[im 1/36]
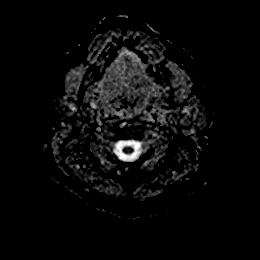
[im 36/36]
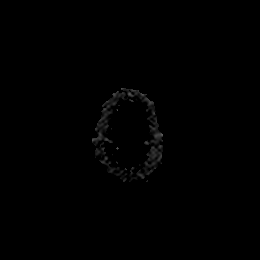

[Series 11: DWI · coronal · 4.0mm · 0.88mm/px · 5 of 68 slices shown (3 of 4)]
[im 1/68]
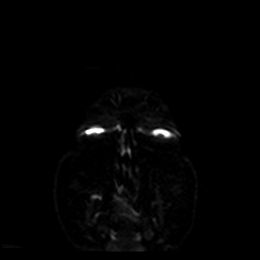
[im 17/68]
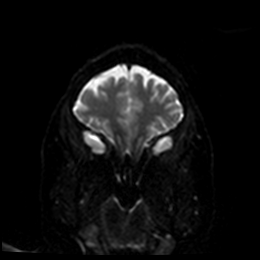
[im 34/68]
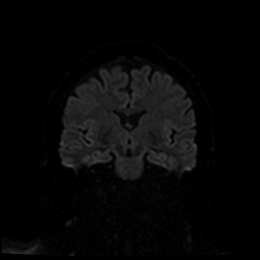
[im 51/68]
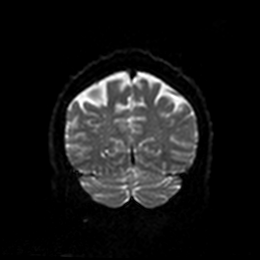
[im 68/68]
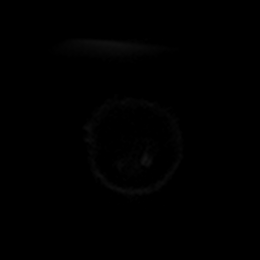

[Series 12: DWI · coronal · 4.0mm · 0.88mm/px · 3 of 34 slices shown (4 of 4)]
[im 1/34]
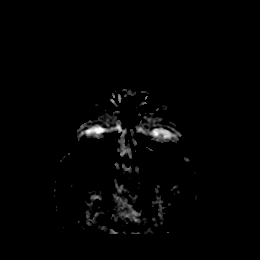
[im 17/34]
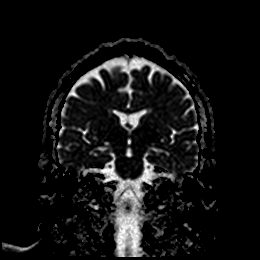
[im 34/34]
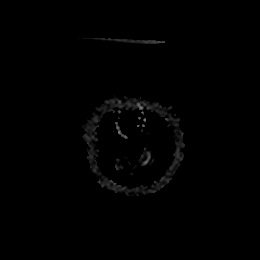

[Series 13: T1 · sagittal · 5.0mm · 0.72mm/px · 2 of 22 slices shown]
[im 1/22]
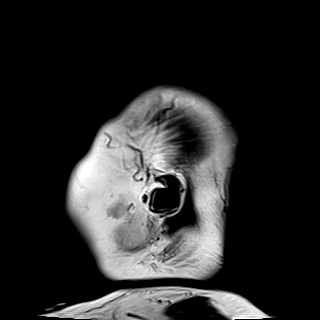
[im 22/22]
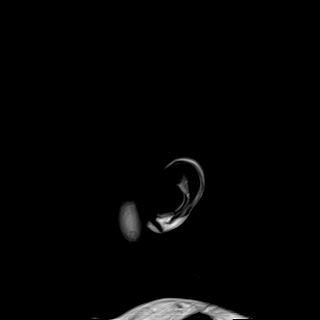

[Series 14: T2 · axial · 4.0mm · 0.75mm/px · z∈[-203,-50]mm · 3 of 33 slices shown (1 of 2)]
[im 1/33]
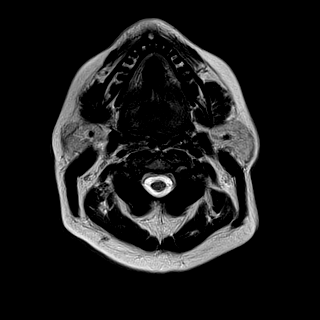
[im 17/33]
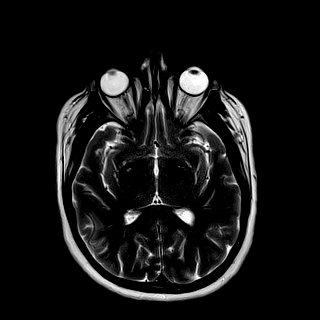
[im 33/33]
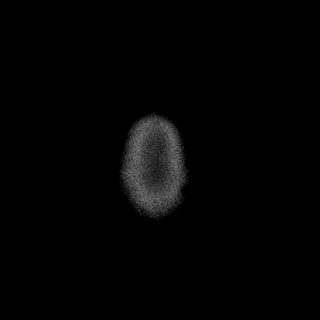

[Series 15: swi_images · axial · 3.0mm · 0.90mm/px · z∈[-209,-40]mm · 5 of 60 slices shown]
[im 1/60]
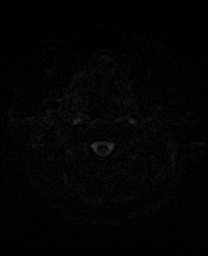
[im 15/60]
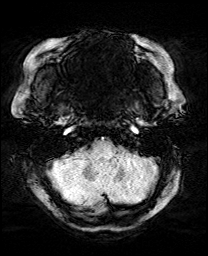
[im 30/60]
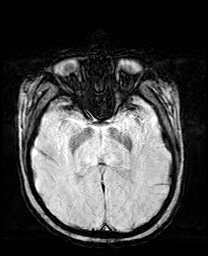
[im 45/60]
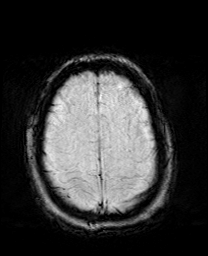
[im 60/60]
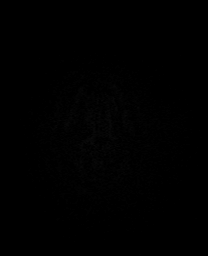

[Series 16: mip_images(sw) · axial · 24.0mm · 0.90mm/px · z∈[-199,-50]mm · 4 of 53 slices shown]
[im 1/53]
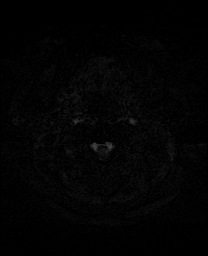
[im 18/53]
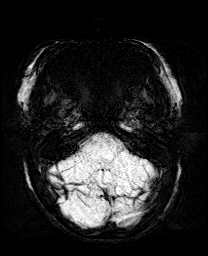
[im 35/53]
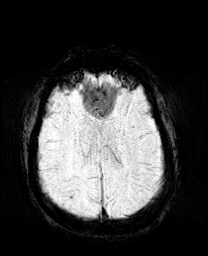
[im 53/53]
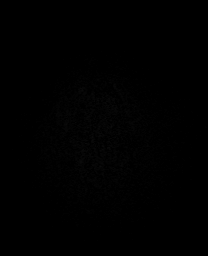

[Series 17: FLAIR · axial · 4.0mm · 0.45mm/px · z∈[-200,-48]mm · 3 of 33 slices shown]
[im 1/33]
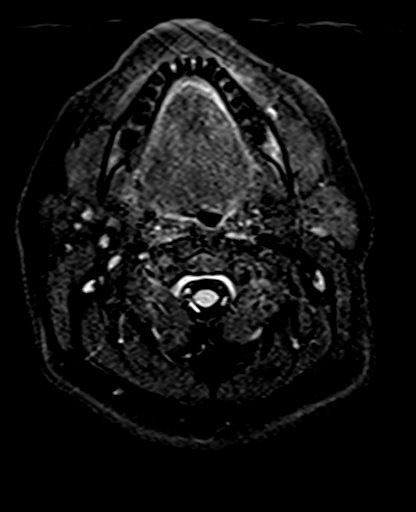
[im 17/33]
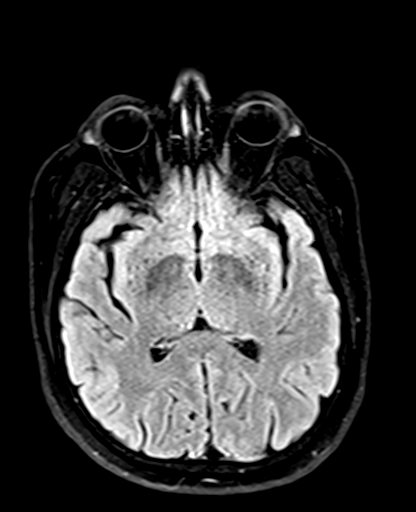
[im 33/33]
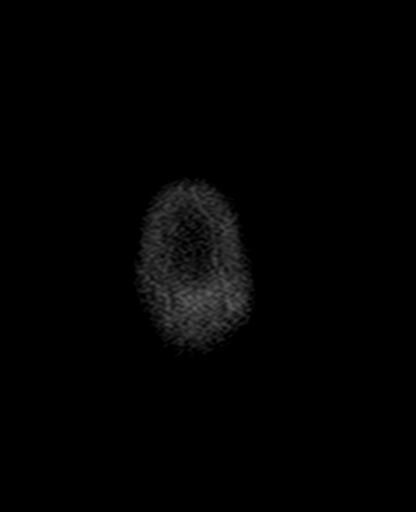

[Series 19: t2_(id)_tra_iso · axial · 0.6mm · 0.56mm/px · z∈[-167,-127]mm · 6 of 72 slices shown]
[im 1/72]
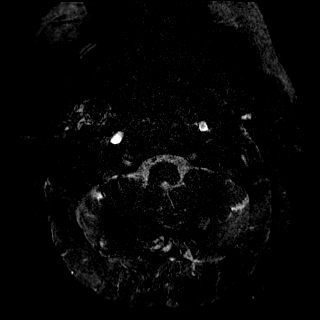
[im 15/72]
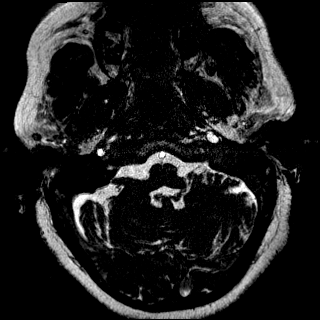
[im 29/72]
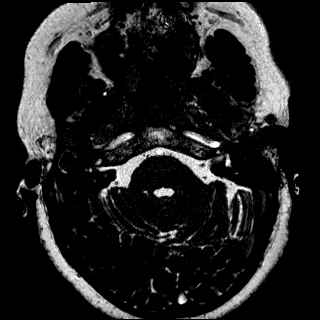
[im 43/72]
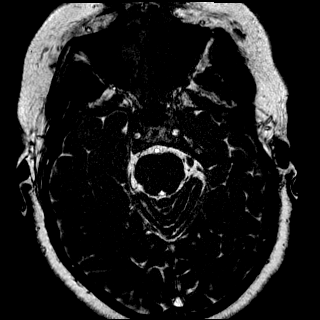
[im 57/72]
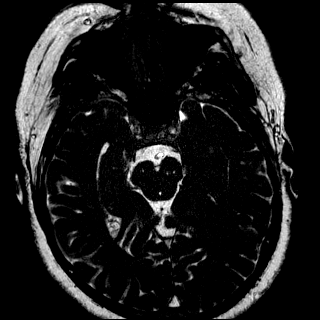
[im 72/72]
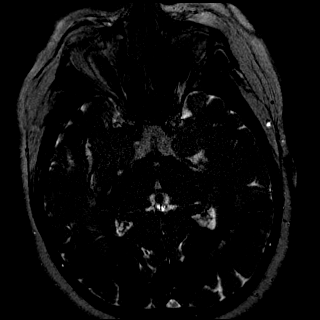

[Series 20: t1_tse_tra_3mm · axial · 3.0mm · 0.31mm/px · 1 of 15 slices shown (1 of 2)]
[im 1/15]
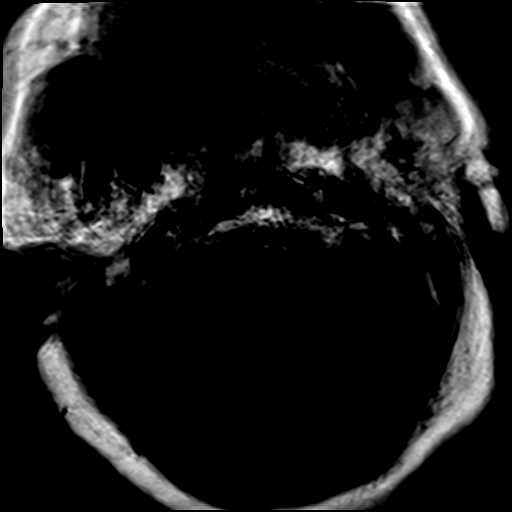

[Series 21: T2 · coronal · 5.0mm · 0.72mm/px · 2 of 28 slices shown (2 of 2)]
[im 1/28]
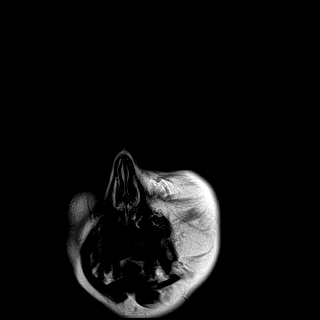
[im 28/28]
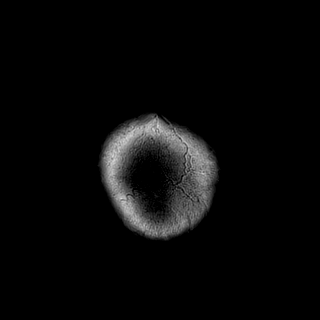

[Series 22: t1_tse_tra_3mm · axial · 3.0mm · 0.31mm/px · 1 of 15 slices shown (2 of 2)]
[im 1/15]
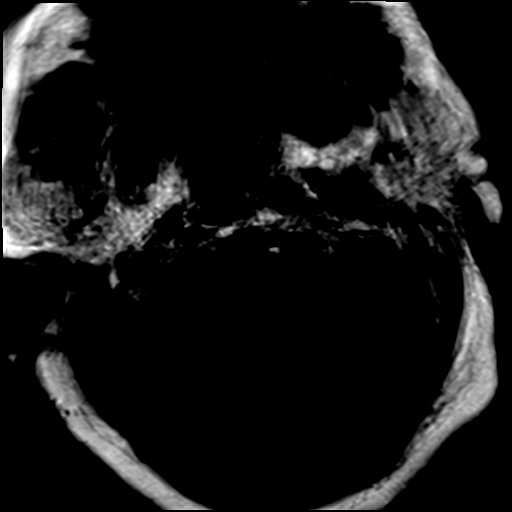

[Series 23: t1_tse_r_cor_3mm · coronal · 3.0mm · 0.33mm/px · 1 of 13 slices shown]
[im 1/13]
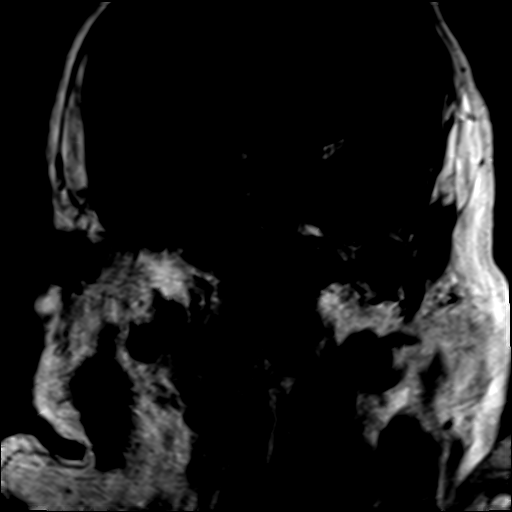

[43 of 48 positions shown; findings below may reference images not displayed]

FINDINGS: Brain: No acute infarct, mass effect or extra-axial collection. No
acute or chronic hemorrhage. Normal white matter signal, parenchymal
volume and CSF spaces. The midline structures are normal.

Vascular: Major flow voids are preserved.

Skull and upper cervical spine: Normal calvarium and skull base.
Visualized upper cervical spine and soft tissues are normal.

Sinuses/Orbits:No paranasal sinus fluid levels or advanced mucosal
thickening. No mastoid or middle ear effusion. Normal orbits.
IMPRESSION: Normal brain MRI. Assessment of the cranial nerves limited in the
absence of intravenous contrast material.

## 2023-03-28 ENCOUNTER — Encounter: Payer: Self-pay | Admitting: Radiology

## 2023-03-28 ENCOUNTER — Ambulatory Visit (INDEPENDENT_AMBULATORY_CARE_PROVIDER_SITE_OTHER): Payer: Commercial Managed Care - HMO | Admitting: Radiology

## 2023-03-28 VITALS — BP 112/64 | HR 102 | Ht 58.75 in | Wt 129.0 lb

## 2023-03-28 DIAGNOSIS — N958 Other specified menopausal and perimenopausal disorders: Secondary | ICD-10-CM | POA: Diagnosis not present

## 2023-03-28 DIAGNOSIS — N898 Other specified noninflammatory disorders of vagina: Secondary | ICD-10-CM

## 2023-03-28 DIAGNOSIS — Z01419 Encounter for gynecological examination (general) (routine) without abnormal findings: Secondary | ICD-10-CM | POA: Diagnosis not present

## 2023-03-28 DIAGNOSIS — Z1331 Encounter for screening for depression: Secondary | ICD-10-CM

## 2023-03-28 LAB — WET PREP FOR TRICH, YEAST, CLUE

## 2023-03-28 MED ORDER — ESTRADIOL 10 MCG VA TABS: 1.0000 | ORAL_TABLET | VAGINAL | 11 refills | Status: AC

## 2023-03-28 NOTE — Progress Notes (Signed)
   Amy Clarke [birth year] [id number]   History:  53 y.o. G6P4 presents for annual exam. C/o foul vaginal odor and some occasional itching >1 month. Also reports vaginal dryness. Stopped gabapentin for hot flashes, was causing her drowsiness. Hot flashes have stopped since losing weight. No other gyn concerns.   Gynecologic History Hysterectomy: 2013 AUB  Sexually active: yes  Health Maintenance Last Pap: 2013. Results were: normal Last mammogram: 2020. Results were: normal Last colonoscopy: 2022.      03/28/2023   10:43 AM  Depression screen PHQ 2/9  Decreased Interest 1  Down, Depressed, Hopeless 1  PHQ - 2 Score 2  Altered sleeping 0  Tired, decreased energy 2  Change in appetite 0  Feeling bad or failure about yourself  3  Trouble concentrating 3  Moving slowly or fidgety/restless 3  Suicidal thoughts 0  PHQ-9 Score 13  Difficult doing work/chores Not difficult at all     Past medical history, past surgical history, family history and social history were all reviewed and documented in the EPIC chart.  ROS:  A ROS was performed and pertinent positives and negatives are included.  Exam:  Vitals:   03/28/23 1041  BP: 112/64  Pulse: (!) 102  SpO2: 99%  Weight: 129 lb (58.5 kg)  Height: 4' 10.75" (1.492 m)   Body mass index is 26.28 kg/m.  General appearance:  Normal Thyroid:  Symmetrical, normal in size, without palpable masses or nodularity. Respiratory  Auscultation:  Clear without wheezing or rhonchi Cardiovascular  Auscultation:  Regular rate, without rubs, murmurs or gallops  Edema/varicosities:  Not grossly evident Abdominal  Soft,nontender, without masses, guarding or rebound.  Liver/spleen:  No organomegaly noted  Hernia:  None appreciated  Skin  Inspection:  Grossly normal Breasts: Examined lying and sitting.   Right: Without masses, retractions, nipple discharge or axillary adenopathy.   Left: Without masses, retractions, nipple discharge or  axillary adenopathy. Genitourinary   Inguinal/mons:  Normal without inguinal adenopathy  External genitalia:  Normal appearing vulva with no masses, tenderness, or lesions  BUS/Urethra/Skene's glands:  Normal  Vagina:  Normal appearing with normal color and discharge, no lesions. Atrophy moderate  Cervix:  absent  Uterus:  absent  Adnexa/parametria:     Rt: Normal in size, without masses or tenderness.   Lt: Normal in size, without masses or tenderness.  Anus and perineum: Normal   Raynelle Fanning, CMA present for exam  Microscopic wet-mount exam shows negative for pathogens, normal epithelial cells.   Assessment/Plan:   1. Well woman exam with routine gynecological exam (Primary)   2. Vaginal odor - WET PREP FOR TRICH, YEAST, CLUE  3. Genitourinary syndrome of menopause - Estradiol (YUVAFEM) 10 MCG TABS vaginal tablet; Place 1 tablet (10 mcg total) vaginally 2 (two) times a week.  Dispense: 8 tablet; Refill: 11  4. Positive depression screening Managed by PCP, on meds     Discussed SBE, colonoscopy and DEXA screening as appropriate. Encouraged 160mins/week of cardiovascular and weight bearing exercise minimum. Recommend the use of seatbelts and sunscreen consistently.   Return in 1 year for annual or sooner prn.  Arlie Solomons B WHNP-BC 11:03 AM 03/28/2023

## 2023-03-28 NOTE — Patient Instructions (Signed)
 Preventive Care 16-55 Years Old, Female  Preventive care refers to lifestyle choices and visits with your health care provider that can promote health and wellness. Preventive care visits are also called wellness exams.  What can I expect for my preventive care visit?  Counseling  Your health care provider may ask you questions about your:  Medical history, including:  Past medical problems.  Family medical history.  Pregnancy history.  Current health, including:  Menstrual cycle.  Method of birth control.  Emotional well-being.  Home life and relationship well-being.  Sexual activity and sexual health.  Lifestyle, including:  Alcohol, nicotine or tobacco, and drug use.  Access to firearms.  Diet, exercise, and sleep habits.  Work and work Astron[employer].  Sunscreen use.  Safety issues such as seatbelt and bike helmet use.  Physical exam  Your health care provider will check your:  Height and weight. These may be used to calculate your BMI (body mass index). BMI is a measurement that tells if you are at a healthy weight.  Waist circumference. This measures the distance around your waistline. This measurement also tells if you are at a healthy weight and may help predict your risk of certain diseases, such as type 2 diabetes and high blood pressure.  Heart rate and blood pressure.  Body temperature.  Skin for abnormal spots.  What immunizations do I need?    Vaccines are usually given at various ages, according to a schedule. Your health care provider will recommend vaccines for you based on your age, medical history, and lifestyle or other factors, such as travel or where you work.  What tests do I need?  Screening  Your health care provider may recommend screening tests for certain conditions. This may include:  Lipid and cholesterol levels.  Diabetes screening. This is done by checking your blood sugar (glucose) after you have not eaten for a while (fasting).  Pelvic exam and Pap test.  Hepatitis B test.  Hepatitis C  test.  HIV (human immunodeficiency virus) test.  STI (sexually transmitted infection) testing, if you are at risk.  Lung cancer screening.  Colorectal cancer screening.  Mammogram. Talk with your health care provider about when you should start having regular mammograms. This may depend on whether you have a family history of breast cancer.  BRCA-related cancer screening. This may be done if you have a family history of breast, ovarian, tubal, or peritoneal cancers.  Bone density scan. This is done to screen for osteoporosis.  Talk with your health care provider about your test results, treatment options, and if necessary, the need for more tests.  Follow these instructions at home:  Eating and drinking    Eat a diet that includes fresh fruits and vegetables, whole grains, lean protein, and low-fat dairy products.  Take vitamin and mineral supplements as recommended by your health care provider.  Do not drink alcohol if:  Your health care provider tells you not to drink.  You are pregnant, may be pregnant, or are planning to become pregnant.  If you drink alcohol:  Limit how much you have to 0-1 drink a day.  Know how much alcohol is in your drink. In the [place] one drink equals one 12 oz bottle of beer (355 mL), one 5 oz glass of wine (148 mL), or one 1 oz glass of hard liquor (44 mL).  Lifestyle  Brush your teeth every morning and night with fluoride toothpaste. Floss one time each day.  Exercise for at least  30 minutes 5 or more days each week.  Do not use any products that contain nicotine or tobacco. These products include cigarettes, chewing tobacco, and vaping devices, such as e-cigarettes. If you need help quitting, ask your health care provider.  Do not use drugs.  If you are sexually active, practice safe sex. Use a condom or other form of protection to prevent STIs.  If you do not wish to become pregnant, use a form of birth control. If you plan to become pregnant, see your health care provider for a  prepregnancy visit.  Take aspirin only as told by your health care provider. Make sure that you understand how much to take and what form to take. Work with your health care provider to find out whether it is safe and beneficial for you to take aspirin daily.  Find healthy ways to manage stress, such as:  Meditation, yoga, or listening to music.  Journaling.  Talking to a trusted person.  Spending time with friends and family.  Minimize exposure to UV radiation to reduce your risk of skin cancer.  Safety  Always wear your seat belt while driving or riding in a vehicle.  Do not drive:  If you have been drinking alcohol. Do not ride with someone who has been drinking.  When you are tired or distracted.  While texting.  If you have been using any mind-altering substances or drugs.  Wear a helmet and other protective equipment during sports activities.  If you have firearms in your house, make sure you follow all gun safety procedures.  Seek help if you have been physically or sexually abused.  What's next?  Visit your health care provider once a year for an annual wellness visit.  Ask your health care provider how often you should have your eyes and teeth checked.  Stay up to date on all vaccines.  This information is not intended to replace advice given to you by your health care provider. Make sure you discuss any questions you have with your health care provider.  Document Revised: 07/13/2020 Document Reviewed: 07/13/2020  [employer]vier Patient Education  2024 [employer].

## 2023-05-01 ENCOUNTER — Encounter (HOSPITAL_COMMUNITY): Payer: Self-pay

## 2023-05-02 ENCOUNTER — Other Ambulatory Visit: Payer: Self-pay | Admitting: Cardiovascular Disease

## 2023-05-02 ENCOUNTER — Ambulatory Visit (HOSPITAL_COMMUNITY)
Admission: RE | Admit: 2023-05-02 | Discharge: 2023-05-02 | Disposition: A | Discharge status: Home / Self Care | Payer: Commercial Managed Care - HMO | Source: Ambulatory Visit | Attending: Cardiovascular Disease | Admitting: Cardiovascular Disease

## 2023-05-02 DIAGNOSIS — R0609 Other forms of dyspnea: Secondary | ICD-10-CM

## 2023-05-02 DIAGNOSIS — R931 Abnormal findings on diagnostic imaging of heart and coronary circulation: Secondary | ICD-10-CM | POA: Diagnosis present

## 2023-05-02 MED ORDER — LORAZEPAM 2 MG/ML IJ SOLN
INTRAMUSCULAR | Status: AC
  Filled 2023-05-02: qty 1

## 2023-05-02 MED ORDER — LORAZEPAM 2 MG/ML IJ SOLN
1.0000 mg | Freq: Once | INTRAMUSCULAR | Status: AC
  Administered 2023-05-02: 1 mg via INTRAVENOUS

## 2023-05-02 MED ORDER — GADOBUTROL 1 MMOL/ML IV SOLN
8.0000 mL | Freq: Once | INTRAVENOUS | Status: AC | PRN
  Administered 2023-05-02: 8 mL via INTRAVENOUS

## 2024-01-10 ENCOUNTER — Encounter: Payer: Self-pay | Admitting: Cardiovascular Disease
